# Patient Record
Sex: Female | Born: 1940 | Race: White | Hispanic: No | Smoking: Current every day smoker
Health system: Southern US, Community
[De-identification: ages and names within clinical notes are randomized; demographics above are authoritative.]

## PROBLEM LIST (undated history)

## (undated) DIAGNOSIS — F32A Depression, unspecified: Secondary | ICD-10-CM

## (undated) DIAGNOSIS — I10 Essential (primary) hypertension: Secondary | ICD-10-CM

## (undated) DIAGNOSIS — G4730 Sleep apnea, unspecified: Secondary | ICD-10-CM

## (undated) DIAGNOSIS — Z8739 Personal history of other diseases of the musculoskeletal system and connective tissue: Secondary | ICD-10-CM

## (undated) DIAGNOSIS — Z860100 Personal history of colon polyps, unspecified: Secondary | ICD-10-CM

## (undated) DIAGNOSIS — E039 Hypothyroidism, unspecified: Secondary | ICD-10-CM

## (undated) DIAGNOSIS — G2581 Restless legs syndrome: Secondary | ICD-10-CM

## (undated) DIAGNOSIS — E538 Deficiency of other specified B group vitamins: Secondary | ICD-10-CM

## (undated) DIAGNOSIS — J449 Chronic obstructive pulmonary disease, unspecified: Secondary | ICD-10-CM

## (undated) DIAGNOSIS — K219 Gastro-esophageal reflux disease without esophagitis: Secondary | ICD-10-CM

## (undated) DIAGNOSIS — G43909 Migraine, unspecified, not intractable, without status migrainosus: Secondary | ICD-10-CM

## (undated) DIAGNOSIS — F329 Major depressive disorder, single episode, unspecified: Secondary | ICD-10-CM

## (undated) DIAGNOSIS — C73 Malignant neoplasm of thyroid gland: Secondary | ICD-10-CM

## (undated) DIAGNOSIS — R32 Unspecified urinary incontinence: Secondary | ICD-10-CM

## (undated) DIAGNOSIS — E785 Hyperlipidemia, unspecified: Secondary | ICD-10-CM

## (undated) DIAGNOSIS — G4700 Insomnia, unspecified: Secondary | ICD-10-CM

## (undated) DIAGNOSIS — J302 Other seasonal allergic rhinitis: Secondary | ICD-10-CM

## (undated) DIAGNOSIS — T7840XA Allergy, unspecified, initial encounter: Secondary | ICD-10-CM

## (undated) DIAGNOSIS — H409 Unspecified glaucoma: Secondary | ICD-10-CM

## (undated) DIAGNOSIS — R079 Chest pain, unspecified: Secondary | ICD-10-CM

## (undated) HISTORY — DX: Unspecified urinary incontinence: R32

## (undated) HISTORY — PX: HEMORRHOID SURGERY: SHX153

## (undated) HISTORY — DX: Unspecified glaucoma: H40.9

## (undated) HISTORY — DX: Hyperlipidemia, unspecified: E78.5

## (undated) HISTORY — PX: CATARACT EXTRACTION, BILATERAL: SHX1313

## (undated) HISTORY — PX: CHOLECYSTECTOMY: SHX55

## (undated) HISTORY — DX: Sleep apnea, unspecified: G47.30

## (undated) HISTORY — PX: ABDOMINAL HYSTERECTOMY: SHX81

## (undated) HISTORY — PX: TUBAL LIGATION: SHX77

## (undated) HISTORY — DX: Allergy, unspecified, initial encounter: T78.40XA

---

## 2004-07-30 ENCOUNTER — Ambulatory Visit: Payer: Self-pay | Admitting: Neurosurgery

## 2004-08-21 HISTORY — PX: CERVICAL LAMINECTOMY: SHX94

## 2004-08-22 ENCOUNTER — Inpatient Hospital Stay (HOSPITAL_COMMUNITY): Admission: RE | Admit: 2004-08-22 | Discharge: 2004-08-23 | Payer: Self-pay | Admitting: Neurosurgery

## 2004-11-13 ENCOUNTER — Ambulatory Visit: Payer: Self-pay | Admitting: Internal Medicine

## 2005-11-10 ENCOUNTER — Ambulatory Visit: Payer: Self-pay | Admitting: Ophthalmology

## 2005-12-17 ENCOUNTER — Ambulatory Visit: Payer: Self-pay | Admitting: Internal Medicine

## 2006-01-06 ENCOUNTER — Emergency Department: Payer: Self-pay | Admitting: Emergency Medicine

## 2006-12-20 ENCOUNTER — Ambulatory Visit: Payer: Self-pay | Admitting: Internal Medicine

## 2007-12-22 ENCOUNTER — Ambulatory Visit: Payer: Self-pay | Admitting: Internal Medicine

## 2008-11-23 ENCOUNTER — Ambulatory Visit: Payer: Self-pay | Admitting: Unknown Physician Specialty

## 2008-12-26 ENCOUNTER — Ambulatory Visit: Payer: Self-pay | Admitting: Internal Medicine

## 2008-12-27 ENCOUNTER — Ambulatory Visit: Payer: Self-pay | Admitting: Unknown Physician Specialty

## 2009-12-27 ENCOUNTER — Ambulatory Visit: Payer: Self-pay | Admitting: Internal Medicine

## 2010-01-15 ENCOUNTER — Ambulatory Visit: Payer: Self-pay | Admitting: Unknown Physician Specialty

## 2010-12-29 ENCOUNTER — Ambulatory Visit: Payer: Self-pay | Admitting: Internal Medicine

## 2012-01-19 ENCOUNTER — Ambulatory Visit: Payer: Self-pay | Admitting: Unknown Physician Specialty

## 2012-01-19 LAB — CREATININE, SERUM
Creatinine: 0.81 mg/dL (ref 0.60–1.30)
EGFR (African American): >60
EGFR (Non-African Amer.): >60

## 2012-04-26 ENCOUNTER — Ambulatory Visit: Payer: Self-pay | Admitting: Internal Medicine

## 2012-12-13 DIAGNOSIS — R339 Retention of urine, unspecified: Secondary | ICD-10-CM | POA: Insufficient documentation

## 2012-12-13 DIAGNOSIS — N393 Stress incontinence (female) (male): Secondary | ICD-10-CM | POA: Insufficient documentation

## 2012-12-13 DIAGNOSIS — N319 Neuromuscular dysfunction of bladder, unspecified: Secondary | ICD-10-CM | POA: Insufficient documentation

## 2012-12-13 DIAGNOSIS — R350 Frequency of micturition: Secondary | ICD-10-CM | POA: Insufficient documentation

## 2013-06-05 ENCOUNTER — Ambulatory Visit: Payer: Self-pay | Admitting: Family Medicine

## 2013-08-02 ENCOUNTER — Ambulatory Visit: Payer: Self-pay | Admitting: Otolaryngology

## 2013-12-15 ENCOUNTER — Ambulatory Visit: Payer: Self-pay | Admitting: Ophthalmology

## 2013-12-15 DIAGNOSIS — I10 Essential (primary) hypertension: Secondary | ICD-10-CM

## 2013-12-15 LAB — POTASSIUM: Potassium: 3.8 mmol/L (ref 3.5–5.1)

## 2013-12-27 ENCOUNTER — Ambulatory Visit: Payer: Self-pay | Admitting: Ophthalmology

## 2014-06-01 DIAGNOSIS — K219 Gastro-esophageal reflux disease without esophagitis: Secondary | ICD-10-CM | POA: Insufficient documentation

## 2014-06-01 DIAGNOSIS — I10 Essential (primary) hypertension: Secondary | ICD-10-CM | POA: Insufficient documentation

## 2014-06-01 DIAGNOSIS — F419 Anxiety disorder, unspecified: Secondary | ICD-10-CM | POA: Insufficient documentation

## 2014-06-01 DIAGNOSIS — Z85850 Personal history of malignant neoplasm of thyroid: Secondary | ICD-10-CM | POA: Insufficient documentation

## 2014-06-01 DIAGNOSIS — E039 Hypothyroidism, unspecified: Secondary | ICD-10-CM | POA: Insufficient documentation

## 2015-01-12 NOTE — Op Note (Signed)
PATIENT NAME:  Brooke King, Brooke King MR#:  [id number] DATE OF BIRTH:  1941-05-05  DATE OF PROCEDURE:  12/27/2013  PREOPERATIVE DIAGNOSIS:  Senile cataract left eye.  POSTOPERATIVE DIAGNOSIS:  Senile cataract left eye.  PROCEDURE:  Phacoemulsification with posterior chamber intraocular lens implantation of the left eye.  LENS:  SN60WF 22.0-diopter posterior chamber intraocular lens.  ULTRASOUND TIME:  24% of 1 minute 22 seconds.  CDE 19.5.  SURGEON:  Mali Lakyla Biswas, MD  ANESTHESIA:  Topical with tetracaine drops and 2% Xylocaine jelly.  COMPLICATIONS:  None.  DESCRIPTION OF PROCEDURE:  The patient was identified in the holding room and transported to the operating room and placed in the supine position under the operating microscope.  The left eye was identified as the operative eye, and it was prepped and draped in the usual sterile ophthalmic fashion.  A 1 millimeter clear-corneal paracentesis was made at the 1:30 position.  The anterior chamber was filled with Viscoat  viscoelastic.  A 2.4 millimeter keratome was used to make a near-clear corneal incision at the 10:30 position.  A curvilinear capsulorrhexis was made with a cystotome and capsulorrhexis forceps.  Balanced salt solution was used to hydrodissect and hydrodelineate the nucleus.  Phacoemulsification was then used in stop and chop fashion to remove the lens nucleus and epinucleus.  The remaining cortex was then removed using the irrigation and aspiration handpiece. Provisc was then placed into the capsular bag to distend it for lens placement.  A SN60WF 22.0-diopter lens was then injected into the capsular bag.  The remaining viscoelastic was aspirated.  Wounds were hydrated with balanced salt solution.  The anterior chamber was inflated to a physiologic pressure with balanced salt solution, and 0.1 mL of Vigamox 2 mg/mL were injected into the anterior chamber for a total dose of 0.2 mg of intracameral antibiotic at the completion of the  case. Miostat was placed into the anterior chamber to constrict the pupil.  No wound leaks were noted.  Topical Vigamox drops and Maxitrol ointment were applied to the eye.    The patient was taken to the recovery room in stable condition without complications of anesthesia or surgery.    ____________________________ Wyonia Hough, MD crb:ce D: 12/27/2013 15:05:00 ET T: 12/27/2013 17:52:08 ET JOB#: [id number]  cc: Wyonia Hough, MD, <Dictator> Leandrew Koyanagi MD ELECTRONICALLY SIGNED 01/03/2014 14:51

## 2015-04-05 ENCOUNTER — Other Ambulatory Visit: Payer: Self-pay | Admitting: Family Medicine

## 2015-04-05 DIAGNOSIS — Z1231 Encounter for screening mammogram for malignant neoplasm of breast: Secondary | ICD-10-CM

## 2015-04-09 ENCOUNTER — Other Ambulatory Visit: Payer: Self-pay | Admitting: Family Medicine

## 2015-04-09 ENCOUNTER — Ambulatory Visit
Admission: RE | Admit: 2015-04-09 | Discharge: 2015-04-09 | Disposition: A | Discharge status: Home / Self Care | Payer: Medicare Other | Source: Ambulatory Visit | Attending: Family Medicine | Admitting: Family Medicine

## 2015-04-09 DIAGNOSIS — Z1231 Encounter for screening mammogram for malignant neoplasm of breast: Secondary | ICD-10-CM | POA: Diagnosis present

## 2015-04-09 HISTORY — DX: Malignant neoplasm of thyroid gland: C73

## 2015-04-22 ENCOUNTER — Other Ambulatory Visit: Payer: Self-pay | Admitting: Otolaryngology

## 2015-04-22 DIAGNOSIS — R059 Cough, unspecified: Secondary | ICD-10-CM

## 2015-04-22 DIAGNOSIS — R05 Cough: Secondary | ICD-10-CM

## 2015-04-22 DIAGNOSIS — F17200 Nicotine dependence, unspecified, uncomplicated: Secondary | ICD-10-CM

## 2015-04-29 ENCOUNTER — Ambulatory Visit: Payer: Medicare Other

## 2015-04-29 ENCOUNTER — Ambulatory Visit: Payer: Medicare Other | Attending: Otolaryngology

## 2015-04-29 DIAGNOSIS — F5101 Primary insomnia: Secondary | ICD-10-CM | POA: Insufficient documentation

## 2015-04-29 DIAGNOSIS — G4733 Obstructive sleep apnea (adult) (pediatric): Secondary | ICD-10-CM | POA: Insufficient documentation

## 2015-05-02 ENCOUNTER — Ambulatory Visit: Payer: Medicare Other

## 2015-06-07 DIAGNOSIS — G4733 Obstructive sleep apnea (adult) (pediatric): Secondary | ICD-10-CM | POA: Insufficient documentation

## 2015-09-19 ENCOUNTER — Other Ambulatory Visit: Payer: Self-pay | Admitting: Otolaryngology

## 2015-09-19 DIAGNOSIS — R911 Solitary pulmonary nodule: Secondary | ICD-10-CM

## 2015-09-19 DIAGNOSIS — R9389 Abnormal findings on diagnostic imaging of other specified body structures: Secondary | ICD-10-CM

## 2015-09-25 ENCOUNTER — Ambulatory Visit
Admission: RE | Admit: 2015-09-25 | Discharge: 2015-09-25 | Disposition: A | Discharge status: Home / Self Care | Payer: Medicare Other | Source: Ambulatory Visit | Attending: Otolaryngology | Admitting: Otolaryngology

## 2015-09-25 DIAGNOSIS — K7689 Other specified diseases of liver: Secondary | ICD-10-CM | POA: Insufficient documentation

## 2015-09-25 DIAGNOSIS — R05 Cough: Secondary | ICD-10-CM | POA: Diagnosis present

## 2015-09-25 DIAGNOSIS — D3502 Benign neoplasm of left adrenal gland: Secondary | ICD-10-CM | POA: Insufficient documentation

## 2015-09-25 DIAGNOSIS — R911 Solitary pulmonary nodule: Secondary | ICD-10-CM | POA: Diagnosis not present

## 2015-09-25 DIAGNOSIS — R9389 Abnormal findings on diagnostic imaging of other specified body structures: Secondary | ICD-10-CM

## 2015-09-25 DIAGNOSIS — R918 Other nonspecific abnormal finding of lung field: Secondary | ICD-10-CM | POA: Insufficient documentation

## 2015-09-25 DIAGNOSIS — Z720 Tobacco use: Secondary | ICD-10-CM | POA: Diagnosis present

## 2015-09-25 HISTORY — DX: Essential (primary) hypertension: I10

## 2015-09-25 LAB — POCT I-STAT CREATININE: Creatinine, Ser: 0.9 mg/dL (ref 0.44–1.00)

## 2015-09-25 MED ORDER — IOHEXOL 300 MG/ML  SOLN
75.0000 mL | Freq: Once | INTRAMUSCULAR | Status: AC | PRN
  Administered 2015-09-25: 75 mL via INTRAVENOUS

## 2015-10-18 ENCOUNTER — Other Ambulatory Visit: Payer: Self-pay | Admitting: Nurse Practitioner

## 2015-10-18 DIAGNOSIS — K760 Fatty (change of) liver, not elsewhere classified: Secondary | ICD-10-CM

## 2015-10-31 ENCOUNTER — Ambulatory Visit
Admission: RE | Admit: 2015-10-31 | Discharge: 2015-10-31 | Disposition: A | Discharge status: Home / Self Care | Payer: Medicare Other | Source: Ambulatory Visit | Attending: Nurse Practitioner | Admitting: Nurse Practitioner

## 2015-10-31 DIAGNOSIS — K760 Fatty (change of) liver, not elsewhere classified: Secondary | ICD-10-CM

## 2015-11-05 ENCOUNTER — Other Ambulatory Visit: Payer: Self-pay | Admitting: Nurse Practitioner

## 2015-11-05 DIAGNOSIS — K760 Fatty (change of) liver, not elsewhere classified: Secondary | ICD-10-CM

## 2015-11-06 NOTE — Progress Notes (Signed)
Fatty Liver. Will discuss with patient at follow up visit.

## 2015-11-14 ENCOUNTER — Ambulatory Visit
Admission: RE | Admit: 2015-11-14 | Discharge: 2015-11-14 | Disposition: A | Discharge status: Home / Self Care | Payer: Medicare Other | Source: Ambulatory Visit | Attending: Nurse Practitioner | Admitting: Nurse Practitioner

## 2015-11-14 DIAGNOSIS — K760 Fatty (change of) liver, not elsewhere classified: Secondary | ICD-10-CM | POA: Insufficient documentation

## 2015-11-15 ENCOUNTER — Encounter: Payer: Self-pay | Admitting: *Deleted

## 2015-11-18 ENCOUNTER — Ambulatory Visit: Payer: Medicare Other | Admitting: Anesthesiology

## 2015-11-18 ENCOUNTER — Ambulatory Visit
Admission: RE | Admit: 2015-11-18 | Discharge: 2015-11-18 | Disposition: A | Discharge status: Home / Self Care | Payer: Medicare Other | Source: Ambulatory Visit | Attending: Gastroenterology | Admitting: Gastroenterology

## 2015-11-18 ENCOUNTER — Encounter
Admission: RE | Disposition: A | Discharge status: Home / Self Care | Payer: Self-pay | Source: Ambulatory Visit | Attending: Gastroenterology

## 2015-11-18 DIAGNOSIS — Z79899 Other long term (current) drug therapy: Secondary | ICD-10-CM | POA: Insufficient documentation

## 2015-11-18 DIAGNOSIS — F329 Major depressive disorder, single episode, unspecified: Secondary | ICD-10-CM | POA: Diagnosis not present

## 2015-11-18 DIAGNOSIS — Z90710 Acquired absence of both cervix and uterus: Secondary | ICD-10-CM | POA: Insufficient documentation

## 2015-11-18 DIAGNOSIS — F17200 Nicotine dependence, unspecified, uncomplicated: Secondary | ICD-10-CM | POA: Diagnosis not present

## 2015-11-18 DIAGNOSIS — Z85850 Personal history of malignant neoplasm of thyroid: Secondary | ICD-10-CM | POA: Insufficient documentation

## 2015-11-18 DIAGNOSIS — Z885 Allergy status to narcotic agent status: Secondary | ICD-10-CM | POA: Diagnosis not present

## 2015-11-18 DIAGNOSIS — G43909 Migraine, unspecified, not intractable, without status migrainosus: Secondary | ICD-10-CM | POA: Insufficient documentation

## 2015-11-18 DIAGNOSIS — I10 Essential (primary) hypertension: Secondary | ICD-10-CM | POA: Insufficient documentation

## 2015-11-18 DIAGNOSIS — Z7951 Long term (current) use of inhaled steroids: Secondary | ICD-10-CM | POA: Insufficient documentation

## 2015-11-18 DIAGNOSIS — G2581 Restless legs syndrome: Secondary | ICD-10-CM | POA: Insufficient documentation

## 2015-11-18 DIAGNOSIS — K210 Gastro-esophageal reflux disease with esophagitis: Secondary | ICD-10-CM | POA: Insufficient documentation

## 2015-11-18 DIAGNOSIS — E039 Hypothyroidism, unspecified: Secondary | ICD-10-CM | POA: Insufficient documentation

## 2015-11-18 DIAGNOSIS — R195 Other fecal abnormalities: Secondary | ICD-10-CM | POA: Diagnosis present

## 2015-11-18 DIAGNOSIS — Z7982 Long term (current) use of aspirin: Secondary | ICD-10-CM | POA: Diagnosis not present

## 2015-11-18 DIAGNOSIS — Z8249 Family history of ischemic heart disease and other diseases of the circulatory system: Secondary | ICD-10-CM | POA: Diagnosis not present

## 2015-11-18 DIAGNOSIS — Z888 Allergy status to other drugs, medicaments and biological substances status: Secondary | ICD-10-CM | POA: Diagnosis not present

## 2015-11-18 DIAGNOSIS — Z880 Allergy status to penicillin: Secondary | ICD-10-CM | POA: Insufficient documentation

## 2015-11-18 DIAGNOSIS — K5730 Diverticulosis of large intestine without perforation or abscess without bleeding: Secondary | ICD-10-CM | POA: Insufficient documentation

## 2015-11-18 DIAGNOSIS — Z9049 Acquired absence of other specified parts of digestive tract: Secondary | ICD-10-CM | POA: Diagnosis not present

## 2015-11-18 DIAGNOSIS — G4700 Insomnia, unspecified: Secondary | ICD-10-CM | POA: Diagnosis not present

## 2015-11-18 DIAGNOSIS — Z882 Allergy status to sulfonamides status: Secondary | ICD-10-CM | POA: Insufficient documentation

## 2015-11-18 DIAGNOSIS — Z86010 Personal history of colonic polyps: Secondary | ICD-10-CM | POA: Diagnosis not present

## 2015-11-18 DIAGNOSIS — J449 Chronic obstructive pulmonary disease, unspecified: Secondary | ICD-10-CM | POA: Insufficient documentation

## 2015-11-18 DIAGNOSIS — Z823 Family history of stroke: Secondary | ICD-10-CM | POA: Diagnosis not present

## 2015-11-18 DIAGNOSIS — M5030 Other cervical disc degeneration, unspecified cervical region: Secondary | ICD-10-CM | POA: Insufficient documentation

## 2015-11-18 DIAGNOSIS — E538 Deficiency of other specified B group vitamins: Secondary | ICD-10-CM | POA: Insufficient documentation

## 2015-11-18 HISTORY — PX: ESOPHAGOGASTRODUODENOSCOPY (EGD) WITH PROPOFOL: SHX5813

## 2015-11-18 HISTORY — DX: Personal history of colonic polyps: Z86.010

## 2015-11-18 HISTORY — DX: Insomnia, unspecified: G47.00

## 2015-11-18 HISTORY — DX: Personal history of colon polyps, unspecified: Z86.0100

## 2015-11-18 HISTORY — DX: Major depressive disorder, single episode, unspecified: F32.9

## 2015-11-18 HISTORY — DX: Chronic obstructive pulmonary disease, unspecified: J44.9

## 2015-11-18 HISTORY — DX: Chest pain, unspecified: R07.9

## 2015-11-18 HISTORY — DX: Deficiency of other specified B group vitamins: E53.8

## 2015-11-18 HISTORY — DX: Personal history of other diseases of the musculoskeletal system and connective tissue: Z87.39

## 2015-11-18 HISTORY — DX: Restless legs syndrome: G25.81

## 2015-11-18 HISTORY — DX: Hypothyroidism, unspecified: E03.9

## 2015-11-18 HISTORY — DX: Depression, unspecified: F32.A

## 2015-11-18 HISTORY — DX: Other seasonal allergic rhinitis: J30.2

## 2015-11-18 HISTORY — PX: COLONOSCOPY WITH PROPOFOL: SHX5780

## 2015-11-18 HISTORY — DX: Gastro-esophageal reflux disease without esophagitis: K21.9

## 2015-11-18 HISTORY — DX: Migraine, unspecified, not intractable, without status migrainosus: G43.909

## 2015-11-18 LAB — GLUCOSE, CAPILLARY: GLUCOSE-CAPILLARY: 117 mg/dL — AB (ref 65–99)

## 2015-11-18 SURGERY — COLONOSCOPY WITH PROPOFOL
Anesthesia: General

## 2015-11-18 MED ORDER — PROPOFOL 500 MG/50ML IV EMUL
INTRAVENOUS | Status: DC | PRN
  Administered 2015-11-18: 75 ug/kg/min via INTRAVENOUS

## 2015-11-18 MED ORDER — SODIUM CHLORIDE 0.9 % IV SOLN: INTRAVENOUS | Status: DC

## 2015-11-18 MED ORDER — LACTATED RINGERS IV SOLN
INTRAVENOUS | Status: DC | PRN
  Administered 2015-11-18: 11:00:00 via INTRAVENOUS

## 2015-11-18 MED ORDER — MIDAZOLAM HCL 2 MG/2ML IJ SOLN
INTRAMUSCULAR | Status: DC | PRN
  Administered 2015-11-18: 1 mg via INTRAVENOUS

## 2015-11-18 MED ORDER — FENTANYL CITRATE (PF) 100 MCG/2ML IJ SOLN
INTRAMUSCULAR | Status: DC | PRN
  Administered 2015-11-18: 50 ug via INTRAVENOUS

## 2015-11-18 MED ORDER — SODIUM CHLORIDE 0.9 % IV SOLN
INTRAVENOUS | Status: DC
  Administered 2015-11-18: 1000 mL via INTRAVENOUS

## 2015-11-18 NOTE — Op Note (Signed)
[facility] Patient Name: Brooke King Procedure Date: 11/18/2015 10:46 AM MRN: [id number] Account #: [id number] Date of Birth: 10-15-1940 Admit Type: Outpatient Age: 75 Room: [facility] ENDO ROOM 4 Gender: Female Note Status: Finalized Procedure:            Upper GI endoscopy Indications:          Heme positive stool Providers:            Lupita Dawn. Candace Cruise, MD Referring MD:         Caprice Renshaw (Referring MD) Medicines:            Monitored Anesthesia Care Complications:        No immediate complications. Procedure:            Pre-Anesthesia Assessment:                       - Prior to the procedure, a History and Physical was                        performed, and patient medications, allergies and                        sensitivities were reviewed. The patient's tolerance of                        previous anesthesia was reviewed.                       - The risks and benefits of the procedure and the                        sedation options and risks were discussed with the                        patient. All questions were answered and informed                        consent was obtained.                       - After reviewing the risks and benefits, the patient                        was deemed in satisfactory condition to undergo the                        procedure.                       After obtaining informed consent, the endoscope was                        passed under direct vision. Throughout the procedure,                        the patient's blood pressure, pulse, and oxygen                        saturations were monitored continuously. The Endoscope  was introduced through the mouth, and advanced to the                        second part of duodenum. The upper GI endoscopy was                        accomplished without difficulty. The patient tolerated                        the procedure well. Findings:      LA  Grade A (one or more mucosal breaks less than 5 mm, not extending       between tops of 2 mucosal folds) esophagitis was found at the       gastroesophageal junction. Biopsies were taken with a cold forceps for       histology.      The exam was otherwise without abnormality.      The entire examined stomach was normal.      The examined duodenum was normal. Impression:           - LA Grade A reflux esophagitis. Biopsied.                       - The examination was otherwise normal.                       - Normal stomach.                       - Normal examined duodenum. Recommendation:       - Discharge patient to home.                       - Observe patient's clinical course.                       - Await pathology results.                       - The findings and recommendations were discussed with                        the patient. Procedure Code(s):    --- Professional ---                       [phone], Esophagogastroduodenoscopy, flexible, transoral;                        with biopsy, single or multiple Diagnosis Code(s):    --- Professional ---                       K21.0, Gastro-esophageal reflux disease with esophagitis                       R19.5, Other fecal abnormalities CPT copyright 2016 American Medical Association. All rights reserved. The codes documented in this report are preliminary and upon coder review may  be revised to meet current compliance requirements. Hulen Luster, MD 11/18/2015 10:55:49 AM This report has been signed electronically. Number of Addenda: 0 Note Initiated On: 11/18/2015 10:46 AM      [facility]

## 2015-11-18 NOTE — Anesthesia Preprocedure Evaluation (Signed)
Anesthesia Evaluation  Patient identified by MRN, date of birth, ID band Patient awake    Reviewed: Allergy & Precautions, H&P , NPO status , Patient's Chart, lab work & pertinent test results, reviewed documented beta blocker date and time   Airway Mallampati: III   Neck ROM: full    Dental  (+) Poor Dentition   Pulmonary neg pulmonary ROS, COPD, Current Smoker,    Pulmonary exam normal        Cardiovascular hypertension, negative cardio ROS Normal cardiovascular exam     Neuro/Psych  Headaches, PSYCHIATRIC DISORDERS negative neurological ROS  negative psych ROS   GI/Hepatic negative GI ROS, Neg liver ROS, GERD  ,  Endo/Other  negative endocrine ROSHypothyroidism   Renal/GU negative Renal ROS  negative genitourinary   Musculoskeletal   Abdominal   Peds  Hematology negative hematology ROS (+)   Anesthesia Other Findings Past Medical History:   Thyroid cancer ([place])                                         Hypertension                                                 B12 deficiency                                               Chest pain syndrome                                          COPD (chronic obstructive pulmonary disease) (*              Depression                                                   GERD (gastroesophageal reflux disease)                       History of colonic polyps                                    History of degenerative disc disease                           Comment:c-spine, status post cervical laminectomy   Hypothyroidism                                               Migraine  Insomnia                                                     Restless leg syndrome                                        Seasonal allergies                                         Past Surgical History:   CERVICAL LAMINECTOMY                              08/2004      CHOLECYSTECTOMY                                               TUBAL LIGATION                                                ABDOMINAL HYSTERECTOMY                                        HEMORRHOID SURGERY                                          BMI    Body Mass Index   34.37 kg/m 2     Reproductive/Obstetrics                             Anesthesia Physical Anesthesia Plan  ASA: III  Anesthesia Plan: General   Post-op Pain Management:    Induction:   Airway Management Planned:   Additional Equipment:   Intra-op Plan:   Post-operative Plan:   Informed Consent: I have reviewed the patients History and Physical, chart, labs and discussed the procedure including the risks, benefits and alternatives for the proposed anesthesia with the patient or authorized representative who has indicated his/her understanding and acceptance.   Dental Advisory Given  Plan Discussed with: CRNA  Anesthesia Plan Comments:         Anesthesia Quick Evaluation

## 2015-11-18 NOTE — Anesthesia Postprocedure Evaluation (Signed)
Anesthesia Post Note  Patient: AVACYN BJORGE  Procedure(s) Performed: Procedure(s) (LRB): COLONOSCOPY WITH PROPOFOL (N/A) ESOPHAGOGASTRODUODENOSCOPY (EGD) WITH PROPOFOL (N/A)  Patient location during evaluation: PACU Anesthesia Type: General Level of consciousness: awake and alert Pain management: pain level controlled Vital Signs Assessment: post-procedure vital signs reviewed and stable Respiratory status: spontaneous breathing, nonlabored ventilation, respiratory function stable and patient connected to nasal cannula oxygen Cardiovascular status: blood pressure returned to baseline and stable Postop Assessment: no signs of nausea or vomiting Anesthetic complications: no    Last Vitals:  Filed Vitals:   11/18/15 1131 11/18/15 1141  BP: 111/70 117/90  Pulse: 77 70  Temp:    Resp: 19 27    Last Pain: There were no vitals filed for this visit.               Molli Barrows

## 2015-11-18 NOTE — Op Note (Signed)
[facility] Patient Name: Brooke King Procedure Date: 11/18/2015 10:45 AM MRN: [id number] Account #: [id number] Date of Birth: 1941/07/20 Admit Type: Outpatient Age: 75 Room: [facility] ENDO ROOM 4 Gender: Female Note Status: Finalized Procedure:            Colonoscopy Indications:          Heme positive stool, Personal history of colonic polyps Providers:            Lupita Dawn. Candace Cruise, MD Referring MD:         Caprice Renshaw (Referring MD) Medicines:            Monitored Anesthesia Care Complications:        No immediate complications. Procedure:            Pre-Anesthesia Assessment:                       - Prior to the procedure, a History and Physical was                        performed, and patient medications, allergies and                        sensitivities were reviewed. The patient's tolerance of                        previous anesthesia was reviewed.                       - The risks and benefits of the procedure and the                        sedation options and risks were discussed with the                        patient. All questions were answered and informed                        consent was obtained.                       - After reviewing the risks and benefits, the patient                        was deemed in satisfactory condition to undergo the                        procedure.                       After obtaining informed consent, the colonoscope was                        passed under direct vision. Throughout the procedure,                        the patient's blood pressure, pulse, and oxygen                        saturations were monitored continuously. The Olympus  CF-Q160AL colonoscope (S#. [id number]) was introduced                        through the anus and advanced to the the cecum,                        identified by appendiceal orifice and ileocecal valve.                        The colonoscopy was  performed without difficulty. The                        patient tolerated the procedure well. The quality of                        the bowel preparation was fair. Findings:      Many small and large-mouthed diverticula were found in the sigmoid colon.      The exam was otherwise without abnormality. Impression:           - Preparation of the colon was fair.                       - Diverticulosis in the sigmoid colon.                       - The examination was otherwise normal.                       - No specimens collected. Recommendation:       - Discharge patient to home.                       - Repeat colonoscopy in 5 years for surveillance.                       - The findings and recommendations were discussed with                        the patient. Procedure Code(s):    --- Professional ---                       [phone], Colonoscopy, flexible; diagnostic, including                        collection of specimen(s) by brushing or washing, when                        performed (separate procedure) Diagnosis Code(s):    --- Professional ---                       R19.5, Other fecal abnormalities                       Z86.010, Personal history of colonic polyps                       K57.30, Diverticulosis of large intestine without                        perforation or abscess without bleeding CPT copyright 2016 American Medical Association. All  rights reserved. The codes documented in this report are preliminary and upon coder review may  be revised to meet current compliance requirements. Hulen Luster, MD 11/18/2015 11:10:01 AM This report has been signed electronically. Number of Addenda: 0 Note Initiated On: 11/18/2015 10:45 AM Scope Withdrawal Time: 0 hours 6 minutes 7 seconds  Total Procedure Duration: 0 hours 10 minutes 18 seconds       [facility]

## 2015-11-18 NOTE — Transfer of Care (Signed)
Immediate Anesthesia Transfer of Care Note  Patient: Brooke King  Procedure(s) Performed: Procedure(s): COLONOSCOPY WITH PROPOFOL (N/A) ESOPHAGOGASTRODUODENOSCOPY (EGD) WITH PROPOFOL (N/A)  Patient Location: PACU  Anesthesia Type:General  Level of Consciousness: awake  Airway & Oxygen Therapy: Patient Spontanous Breathing and Patient connected to nasal cannula oxygen  Post-op Assessment: Report given to RN  Post vital signs: Reviewed and stable  Last Vitals:  Filed Vitals:   11/18/15 1010 11/18/15 1110  BP: 148/75 155/53  Pulse: 80 84  Temp: 35.7 C 36.7 C  Resp: 16 16    Complications: No apparent anesthesia complications

## 2015-11-18 NOTE — H&P (Signed)
Primary Care Physician:  Marcello Fennel, MD Primary Gastroenterologist:  Dr. Candace Cruise  Pre-Procedure History & Physical: HPI:  Brooke King is a 74 y.o. female is here for an EGD/colonoscopy   Past Medical History  Diagnosis Date  . Thyroid cancer ([place])   . Hypertension   . B12 deficiency   . Chest pain syndrome   . COPD (chronic obstructive pulmonary disease) ([place])   . Depression   . GERD (gastroesophageal reflux disease)   . History of colonic polyps   . History of degenerative disc disease     c-spine, status post cervical laminectomy  . Hypothyroidism   . Migraine   . Insomnia   . Restless leg syndrome   . Seasonal allergies     Past Surgical History  Procedure Laterality Date  . Cervical laminectomy  08/2004  . Cholecystectomy    . Tubal ligation    . Abdominal hysterectomy    . Hemorrhoid surgery      Prior to Admission medications   Medication Sig Start Date End Date Taking? Authorizing Provider  albuterol (PROVENTIL HFA;VENTOLIN HFA) 108 (90 Base) MCG/ACT inhaler Inhale into the lungs every 6 (six) hours as needed for wheezing or shortness of breath.   Yes Historical Provider, MD  ALPRAZolam Duanne Moron) 0.5 MG tablet Take 0.5 mg by mouth at bedtime as needed for anxiety.   Yes Historical Provider, MD  aspirin EC 81 MG tablet Take 81 mg by mouth daily.   Yes Historical Provider, MD  azelastine (ASTELIN) 0.1 % nasal spray Place into both nostrils 2 (two) times daily. Use in each nostril as directed   Yes Historical Provider, MD  buPROPion (WELLBUTRIN XL) 150 MG 24 hr tablet Take 150 mg by mouth daily.   Yes Historical Provider, MD  cetirizine (ZYRTEC) 10 MG tablet Take 10 mg by mouth daily.   Yes Historical Provider, MD  fluticasone (FLONASE) 50 MCG/ACT nasal spray Place into both nostrils daily.   Yes Historical Provider, MD  gabapentin (NEURONTIN) 100 MG capsule Take 100 mg by mouth 3 (three) times daily.   Yes Historical Provider, MD  latanoprost (XALATAN) 0.005 %  ophthalmic solution 1 drop at bedtime.   Yes Historical Provider, MD  levothyroxine (SYNTHROID, LEVOTHROID) 150 MCG tablet Take 150 mcg by mouth daily before breakfast.   Yes Historical Provider, MD  montelukast (SINGULAIR) 10 MG tablet Take 10 mg by mouth at bedtime.   Yes Historical Provider, MD  omeprazole (PRILOSEC OTC) 20 MG tablet Take 20 mg by mouth daily.   Yes Historical Provider, MD  valsartan (DIOVAN) 80 MG tablet Take 80 mg by mouth daily.   Yes Historical Provider, MD  vitamin B-12 (CYANOCOBALAMIN) 1000 MCG tablet Take 1,000 mcg by mouth daily.   Yes Historical Provider, MD    Allergies as of 11/12/2015 - Review Complete 09/25/2015  Allergen Reaction Noted  . Penicillins Rash 09/25/2015    Family History  Problem Relation Age of Onset  . Breast cancer Neg Hx     Social History   Social History  . Marital Status: Married    Spouse Name: N/A  . Number of Children: N/A  . Years of Education: N/A   Occupational History  . Not on file.   Social History Main Topics  . Smoking status: Current Every Day Smoker  . Smokeless tobacco: Not on file  . Alcohol Use: No  . Drug Use: No  . Sexual Activity: Not on file   Other Topics  Concern  . Not on file   Social History Narrative    Review of Systems: See HPI, otherwise negative ROS  Physical Exam: There were no vitals taken for this visit. General:   Alert,  pleasant and cooperative in NAD Head:  Normocephalic and atraumatic. Neck:  Supple; no masses or thyromegaly. Lungs:  Clear throughout to auscultation.    Heart:  Regular rate and rhythm. Abdomen:  Soft, nontender and nondistended. Normal bowel sounds, without guarding, and without rebound.   Neurologic:  Alert and  oriented x4;  grossly normal neurologically.  Impression/Plan: Brooke King is here for an EGD/colonoscopy to be performed for heme positive stool, personal hx of colon polyps.  Risks, benefits, limitations, and alternatives regarding   EGD/colonoscopy have been reviewed with the patient.  Questions have been answered.  All parties agreeable.   Panayiotis [place], Lupita Dawn, MD  11/18/2015, 10:03 AM

## 2015-11-20 ENCOUNTER — Encounter: Payer: Self-pay | Admitting: Gastroenterology

## 2015-11-20 LAB — SURGICAL PATHOLOGY

## 2016-06-11 ENCOUNTER — Other Ambulatory Visit: Payer: Self-pay | Admitting: Family Medicine

## 2016-06-11 DIAGNOSIS — Z1231 Encounter for screening mammogram for malignant neoplasm of breast: Secondary | ICD-10-CM

## 2016-06-11 DIAGNOSIS — E1142 Type 2 diabetes mellitus with diabetic polyneuropathy: Secondary | ICD-10-CM | POA: Insufficient documentation

## 2016-07-06 ENCOUNTER — Ambulatory Visit
Admission: RE | Admit: 2016-07-06 | Discharge: 2016-07-06 | Disposition: A | Discharge status: Home / Self Care | Payer: Medicare Other | Source: Ambulatory Visit | Attending: Family Medicine | Admitting: Family Medicine

## 2016-07-06 DIAGNOSIS — Z1231 Encounter for screening mammogram for malignant neoplasm of breast: Secondary | ICD-10-CM | POA: Diagnosis present

## 2016-09-22 ENCOUNTER — Other Ambulatory Visit: Payer: Self-pay | Admitting: Otolaryngology

## 2016-09-22 DIAGNOSIS — R911 Solitary pulmonary nodule: Secondary | ICD-10-CM

## 2016-09-28 ENCOUNTER — Ambulatory Visit: Admission: RE | Admit: 2016-09-28 | Payer: Medicare Other | Source: Ambulatory Visit

## 2016-10-02 ENCOUNTER — Ambulatory Visit
Admission: RE | Admit: 2016-10-02 | Discharge: 2016-10-02 | Disposition: A | Discharge status: Home / Self Care | Payer: Medicare Other | Source: Ambulatory Visit | Attending: Otolaryngology | Admitting: Otolaryngology

## 2016-10-02 DIAGNOSIS — D3502 Benign neoplasm of left adrenal gland: Secondary | ICD-10-CM | POA: Insufficient documentation

## 2016-10-02 DIAGNOSIS — I700 Atherosclerosis of aorta: Secondary | ICD-10-CM | POA: Diagnosis not present

## 2016-10-02 DIAGNOSIS — R911 Solitary pulmonary nodule: Secondary | ICD-10-CM | POA: Insufficient documentation

## 2016-10-02 LAB — POCT I-STAT CREATININE: CREATININE: 0.8 mg/dL (ref 0.44–1.00)

## 2016-10-02 MED ORDER — IOPAMIDOL (ISOVUE-300) INJECTION 61%
75.0000 mL | Freq: Once | INTRAVENOUS | Status: AC | PRN
Start: 2016-10-02 — End: 2016-10-02
  Administered 2016-10-02: 75 mL via INTRAVENOUS

## 2017-06-08 ENCOUNTER — Other Ambulatory Visit: Payer: Self-pay | Admitting: Family Medicine

## 2017-06-08 DIAGNOSIS — Z1231 Encounter for screening mammogram for malignant neoplasm of breast: Secondary | ICD-10-CM

## 2017-06-27 IMAGING — US US LIVER ELASTOGRAPHY
1 series · 13 of 13 positions shown · non-contrast
Comparison: Abdominal ultrasound 10/31/2015

CLINICAL DATA: Patient with hepatic steatosis.

EXAM:
ULTRASOUND HEPATIC ELASTOGRAPHY
TECHNIQUE: Ultrasound elastography evaluation of the liver was performed. A
region of interest was placed in the right lobe of the liver.
Following application of a compressive sonographic pulse, shear
waves were detected in the adjacent hepatic tissue and the shear
wave velocity was calculated. Multiple assessments were performed at
the selected site. Median shear wave velocity is correlated to a
Metavir fibrosis score.

[Series 1: us liver elastography · 0.20mm/px · 13 of 13 slices shown]
[im 1/13]
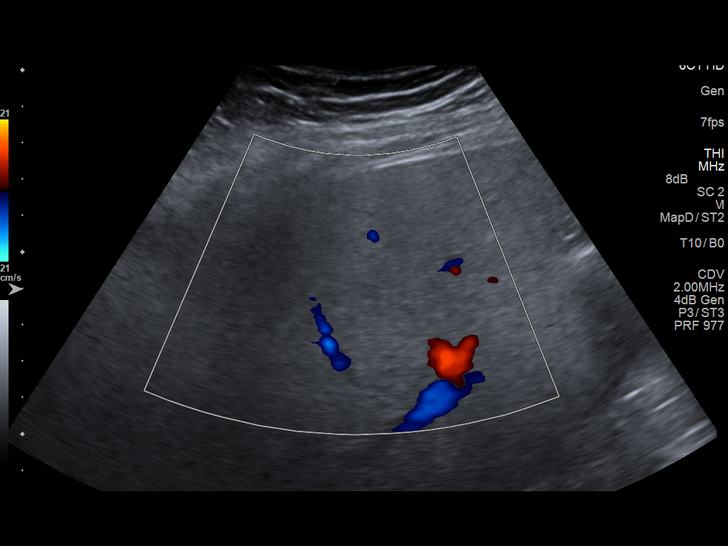
[im 2/13]
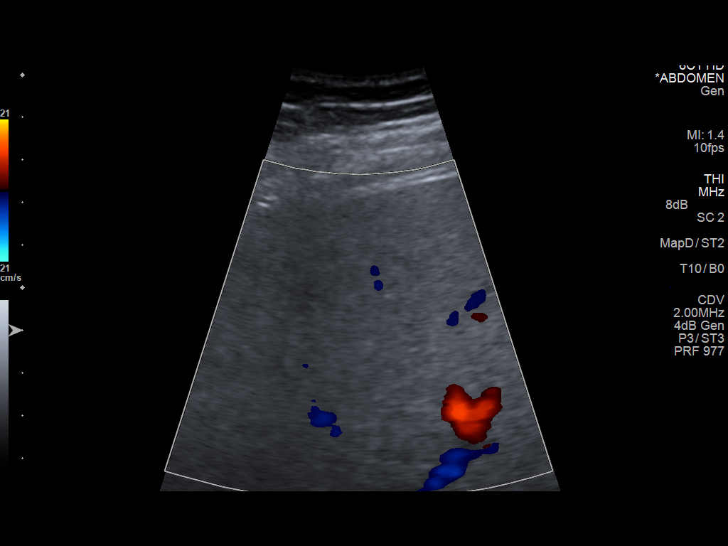
[im 3/13]
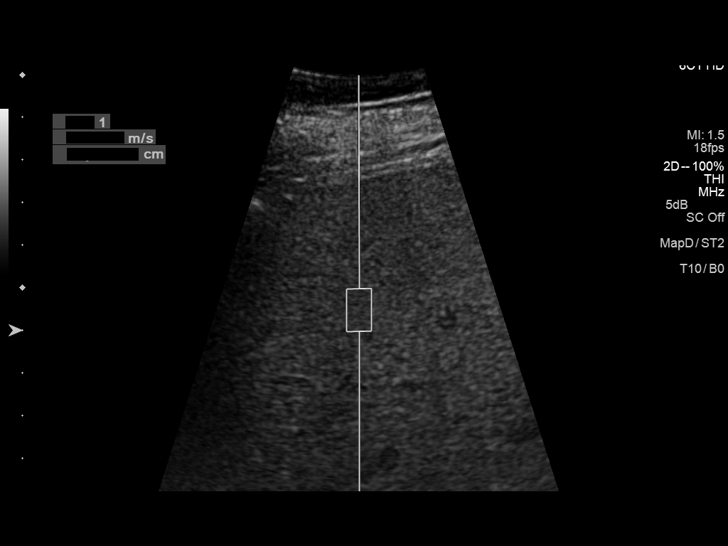
[im 4/13]
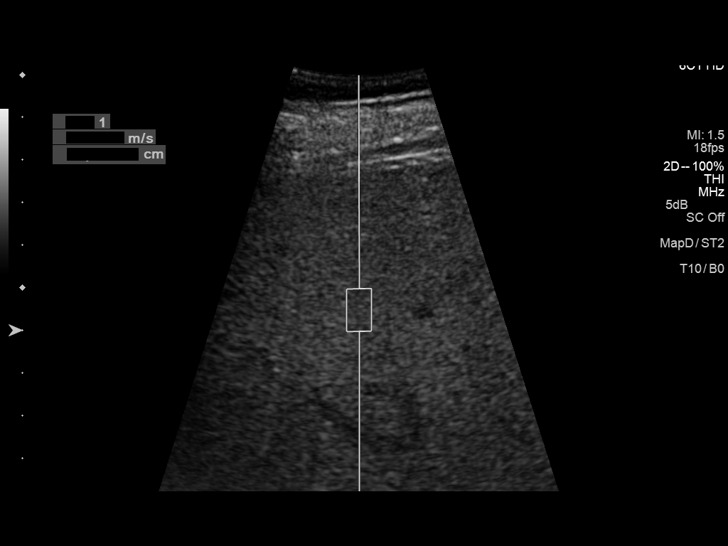
[im 5/13]
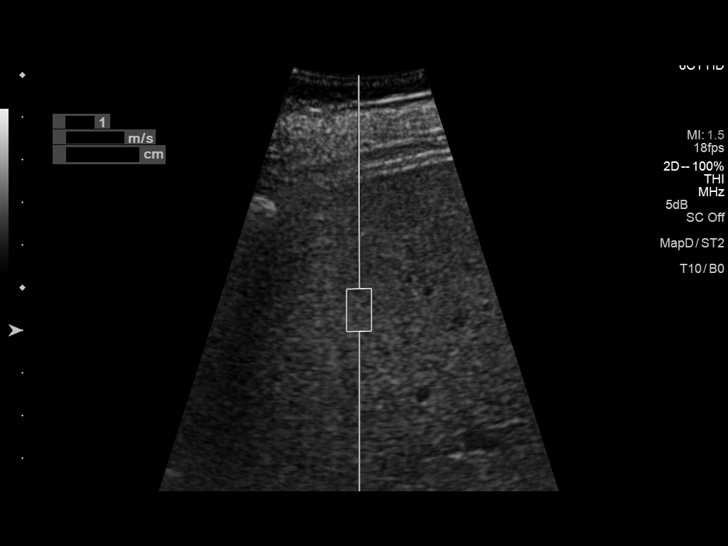
[im 6/13]
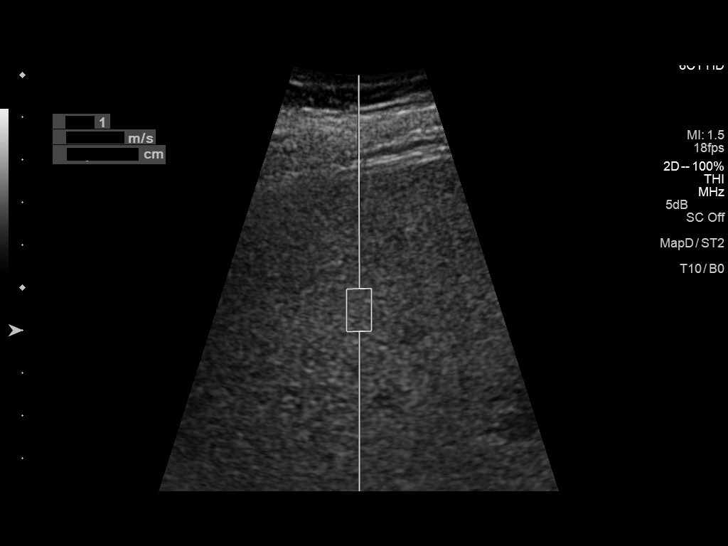
[im 7/13]
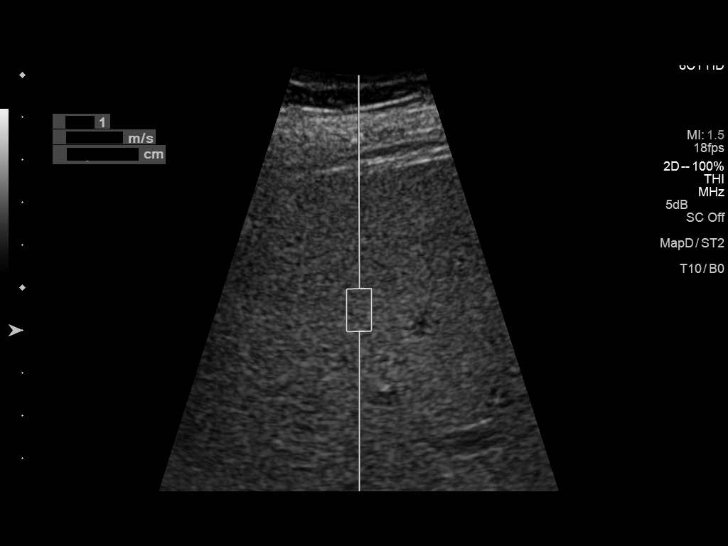
[im 8/13]
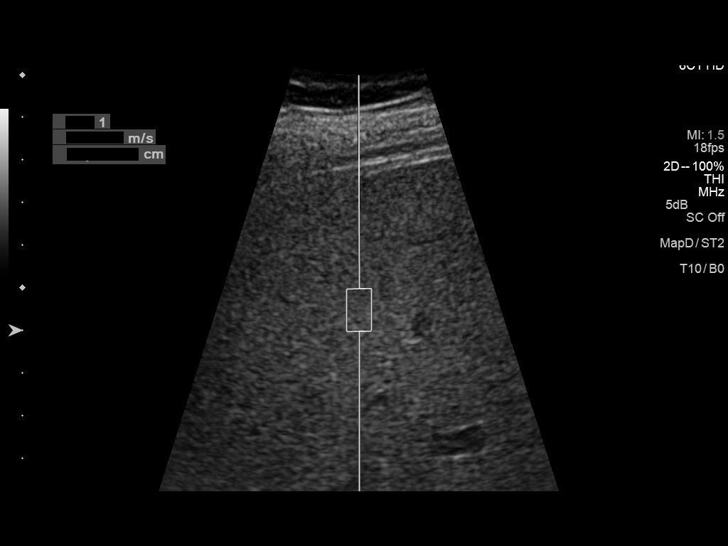
[im 9/13]
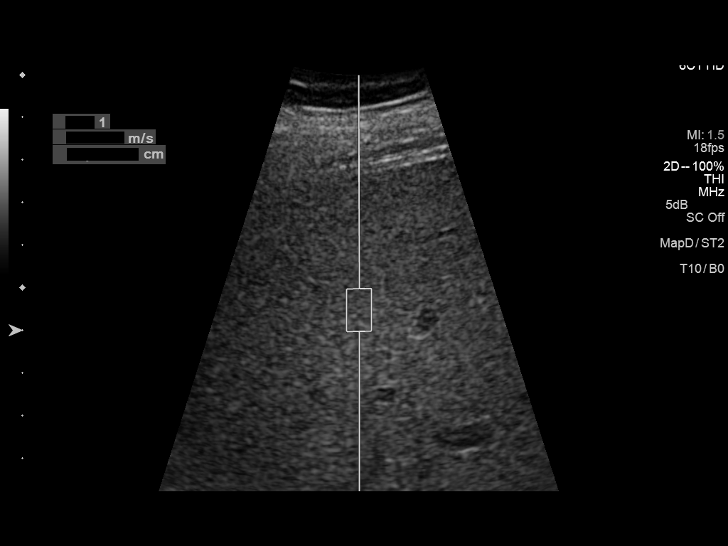
[im 10/13]
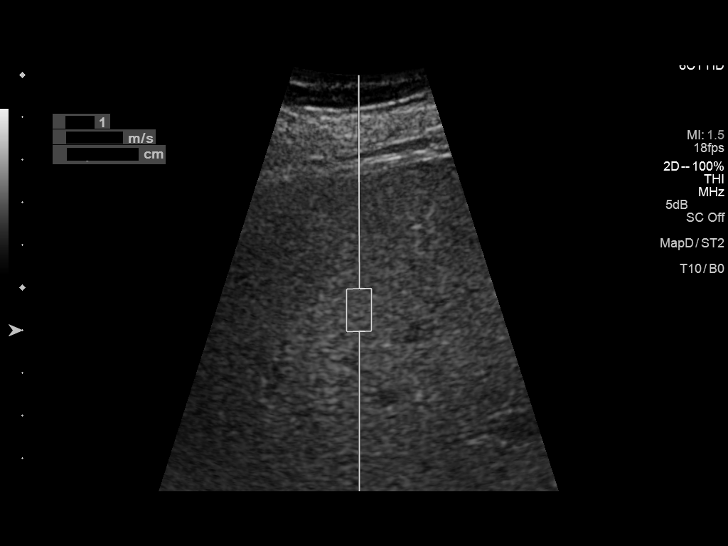
[im 11/13]
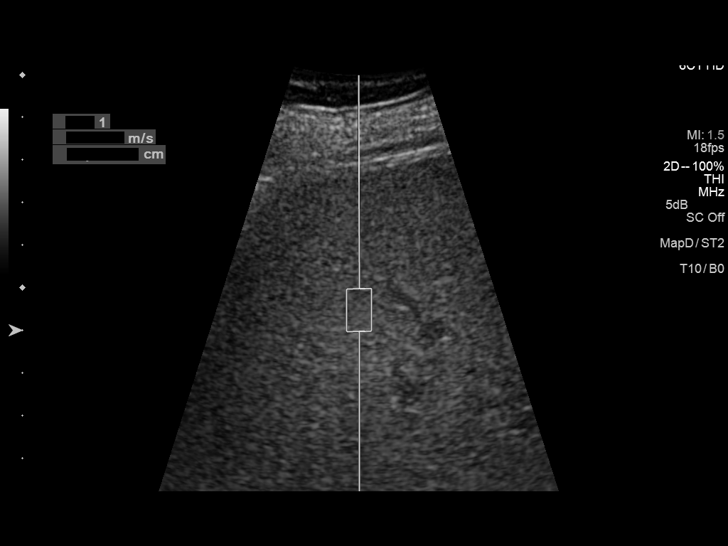
[im 12/13]
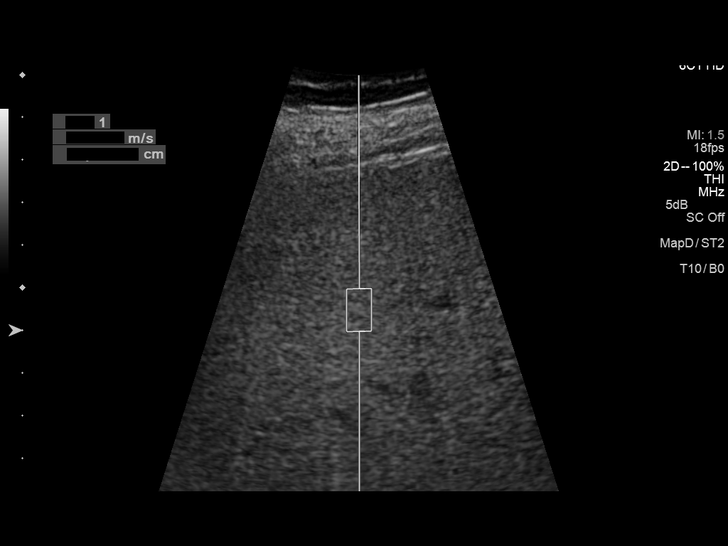
[im 13/13]
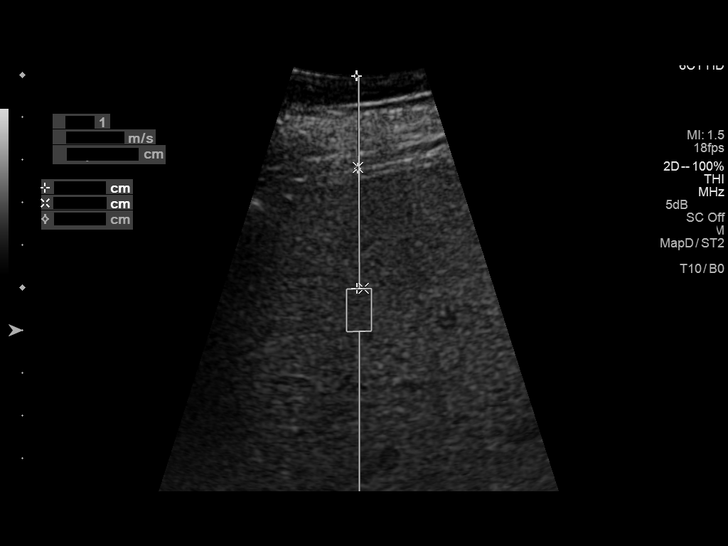

[13 of 13 positions shown; findings below may reference images not displayed]

FINDINGS: Device: Siemens Helix VTQ

Patient position: Supine

Transducer 6C1

Number of measurements: 10

Hepatic segment:  8

Median velocity:   1.96  m/sec

IQR:

IQR/Median velocity ratio: 0.14796

Corresponding Metavir fibrosis score:  F2-F3

Risk of fibrosis: Moderate

Limitations of exam: Breath hold.

Pertinent findings noted on other imaging exams:  None

Please note that abnormal shear wave velocities may also be
identified in clinical settings other than with hepatic fibrosis,
such as: acute hepatitis, elevated right heart and central venous
pressures including use of beta blockers, Sorin disease
([place]), infiltrative processes such as
mastocytosis/amyloidosis/infiltrative tumor, extrahepatic
cholestasis, in the post-prandial state, and liver transplantation.
Correlation with patient history, laboratory data, and clinical
condition recommended.
IMPRESSION: Median hepatic shear wave velocity is calculated at 1.96 m/sec.

Corresponding Metavir fibrosis score is  F2-F3.

Risk of fibrosis is moderate.

Follow-up: Additional testing appropriate

## 2017-07-08 ENCOUNTER — Ambulatory Visit
Admission: RE | Admit: 2017-07-08 | Discharge: 2017-07-08 | Disposition: A | Discharge status: Home / Self Care | Payer: Medicare Other | Source: Ambulatory Visit | Attending: Family Medicine | Admitting: Family Medicine

## 2017-07-08 DIAGNOSIS — Z1231 Encounter for screening mammogram for malignant neoplasm of breast: Secondary | ICD-10-CM

## 2017-11-21 IMAGING — CT CT CHEST W/ CM
2 of 3 series · 15 of 36 positions shown, 18 images · IV contrast (omnipaque)
Comparison: 08/02/2013 chest x-ray and CT scan [DATE]

CLINICAL DATA: Cough and congestion, COPD, abnormal chest x-ray

EXAM:
CT CHEST WITH CONTRAST
TECHNIQUE: Multidetector CT imaging of the chest was performed during
intravenous contrast administration.
CONTRAST:  75mL OMNIPAQUE IOHEXOL 300 MG/ML  SOLN

[Series 2: routine chest with · axial · 0.69mm/px · z∈[-616,-362]mm · 12 of 61 slices shown, 15 images]
[im 5/61  mediastinal]
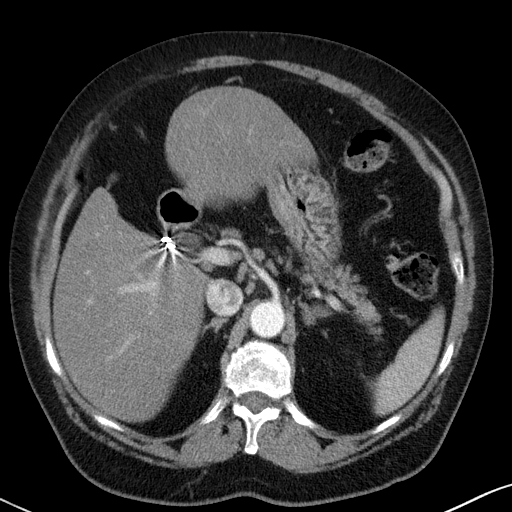
[im 5/61  lung]
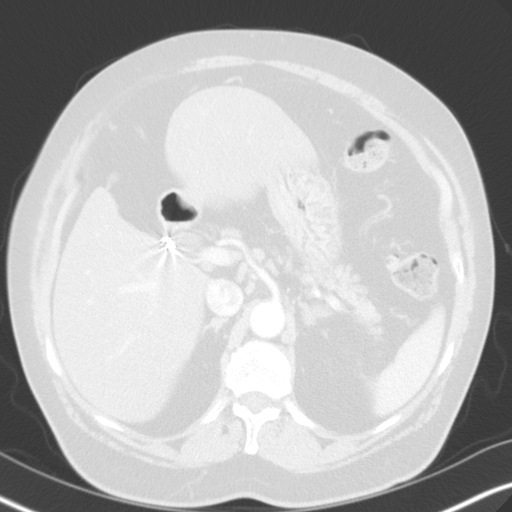
[im 9/61  lung]
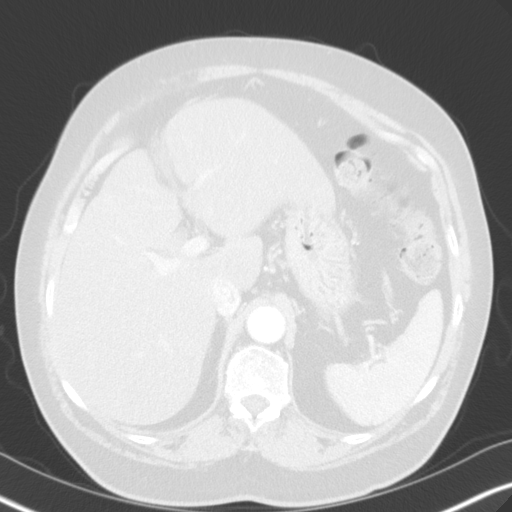
[im 14/61  lung]
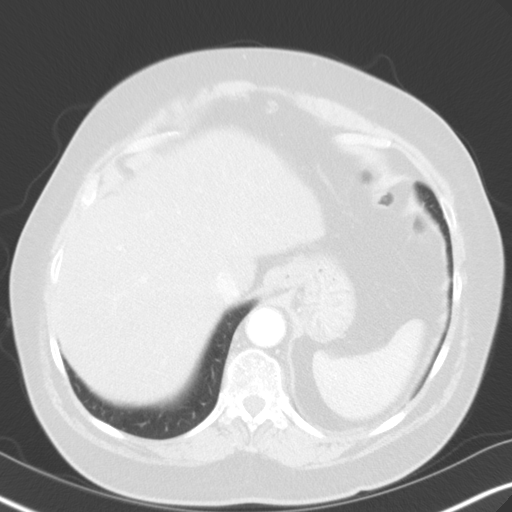
[im 18/61  lung]
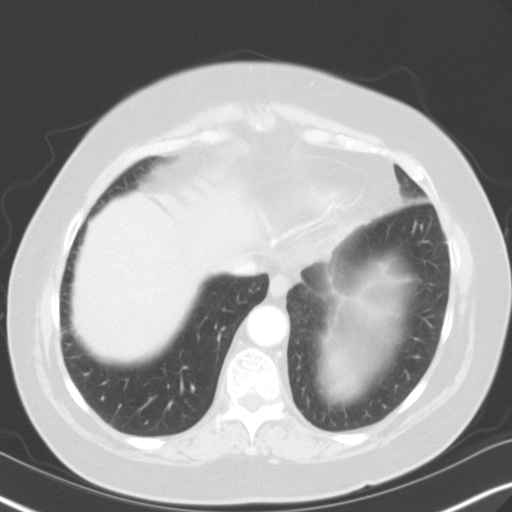
[im 23/61  mediastinal]
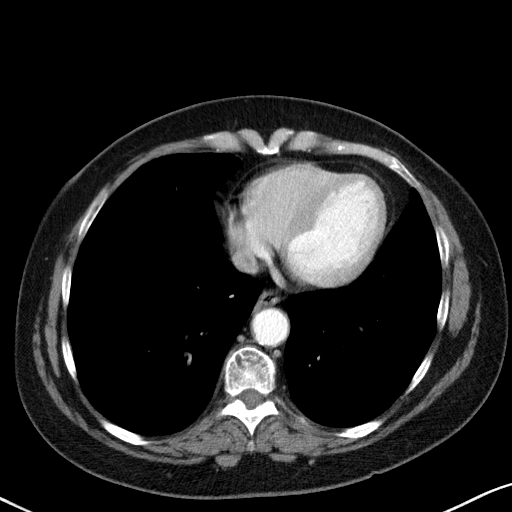
[im 23/61  lung]
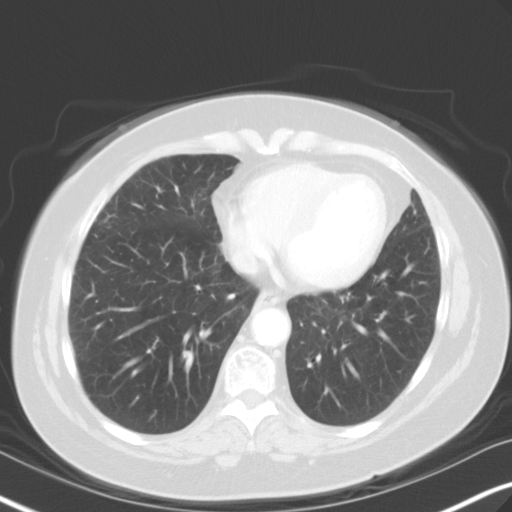
[im 27/61  lung]
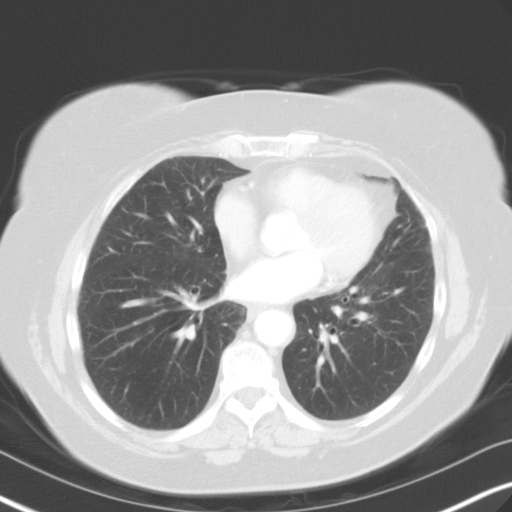
[im 34/61  lung]
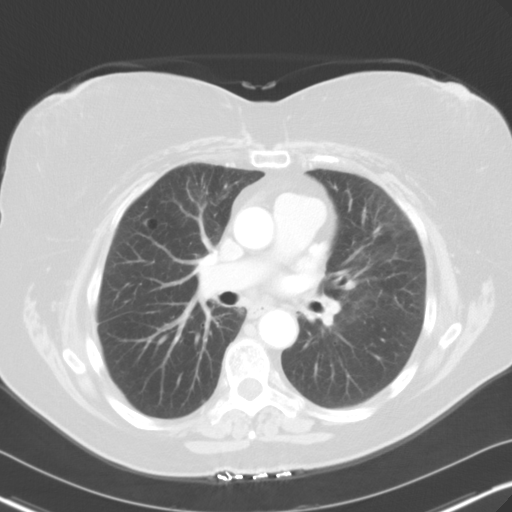
[im 38/61  lung]
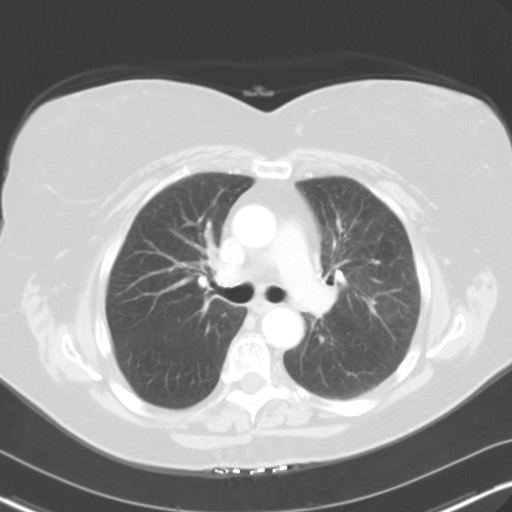
[im 43/61  mediastinal]
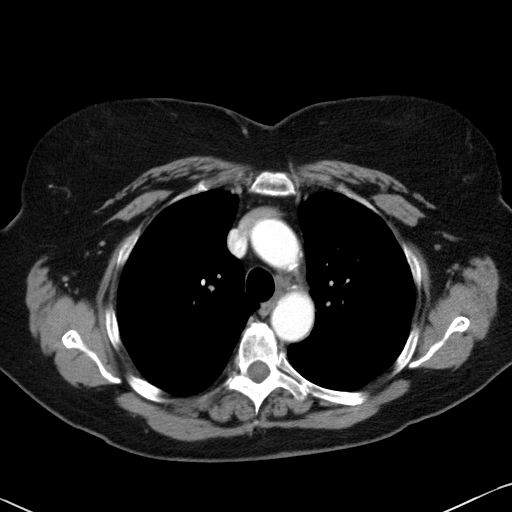
[im 43/61  lung]
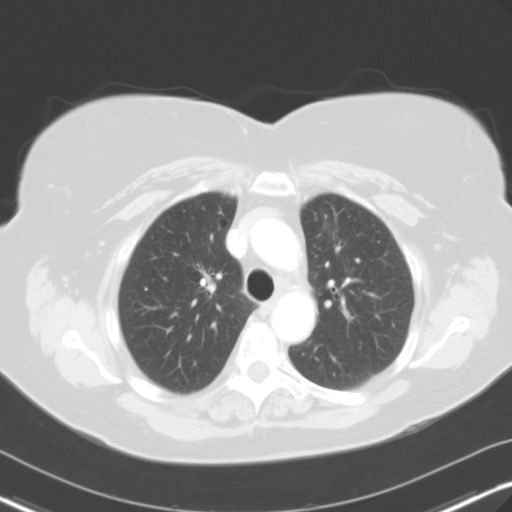
[im 47/61  lung]
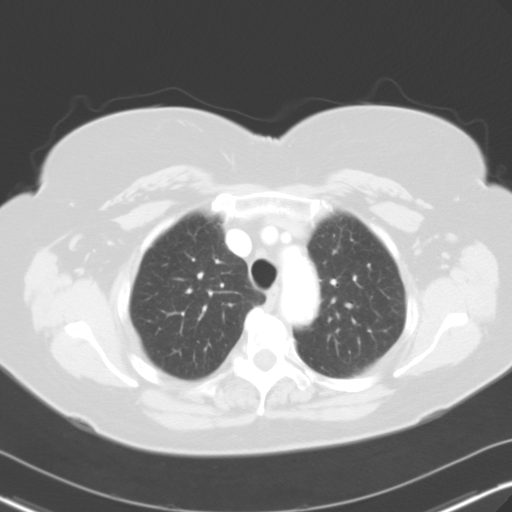
[im 52/61  lung]
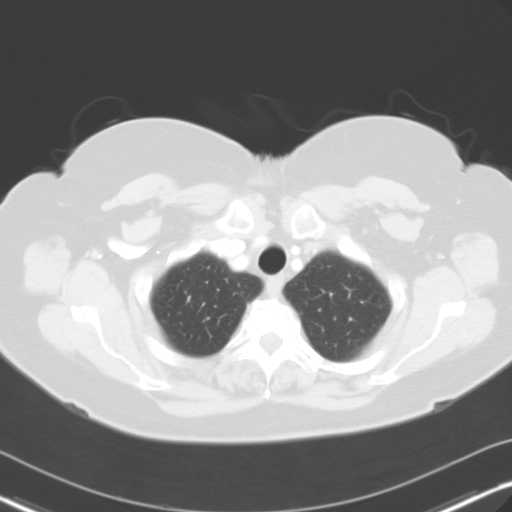
[im 56/61  lung]
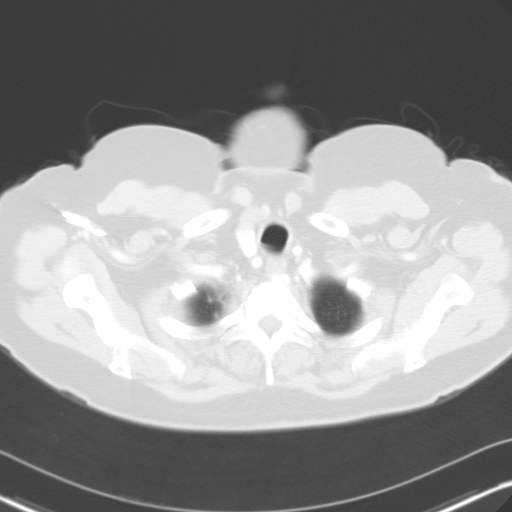

[Series 5: cor routine chest with · coronal · 0.62mm/px · 3 of 174 slices shown]
[im 35/174  lung]
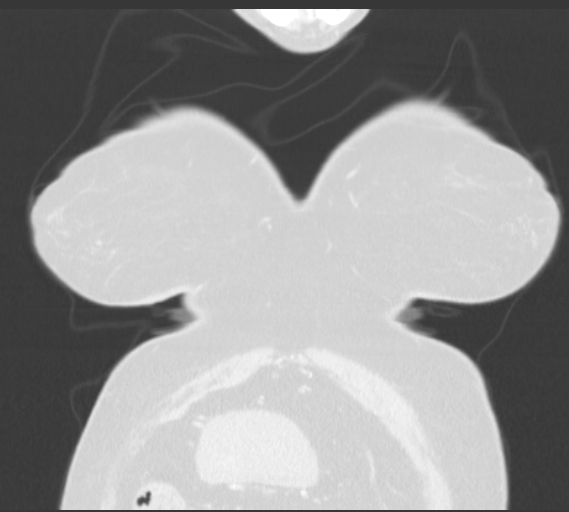
[im 70/174  lung]
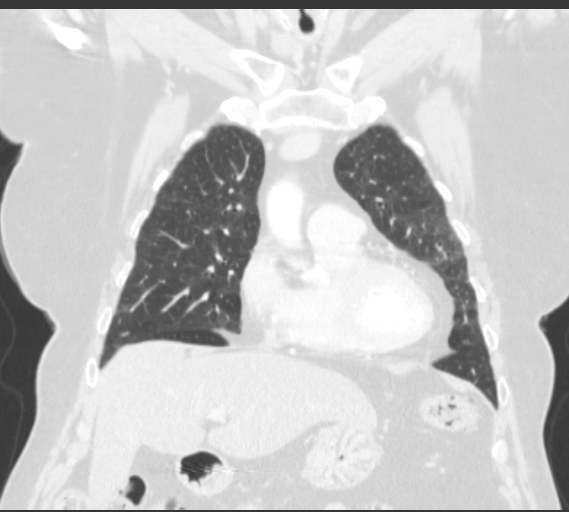
[im 104/174  lung]
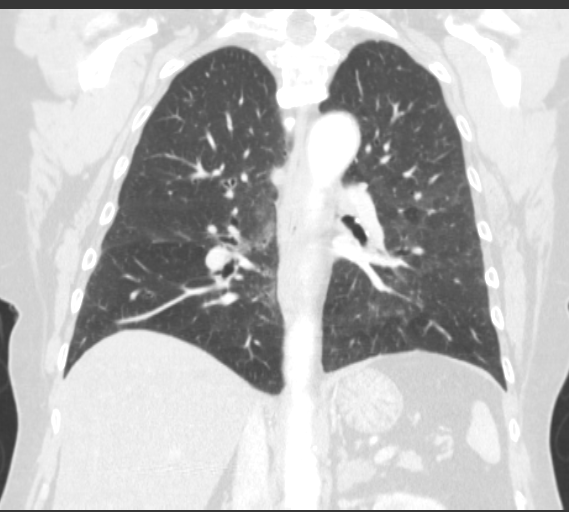

[15 of 36 positions shown; findings below may reference images not displayed]

FINDINGS: Sagittal images of the spine shows degenerative changes with
anterior spurring upper thoracic spine. Minimal degenerative changes
mid thoracic spine. Sagittal view of the sternum is unremarkable.
Mild atherosclerotic calcifications of thoracic aorta.

Minimal hyperinflation.

Central airways are patent. No mediastinal hematoma or adenopathy.
Heart size within normal limits. No hilar adenopathy.

The visualized upper abdomen shows fatty infiltration of the liver.
Status postcholecystectomy. Stable left adrenal at the adenoma
measures 1.9 x 1.3 cm. A cyst in right hepatic lobe posteriorly
measures 7.5 mm stable in size in appearance.

A 3 mm nodule is noted in left lower lobe medially see axial image
33.

Two small emphysematous bullae are noted in right middle lobe the
largest measures 8.5 mm. Single central emphysematous bulla is noted
in left lower lobe. Tiny medial emphysematous bulla is noted in
right lower lobe. There is no acute infiltrate or pulmonary edema.
No pneumothorax.
IMPRESSION: 1. Minimal hyperinflation. Very mild emphysematous changes. A 3 mm
nodule is noted in left lower lobe medially see axial image 33. If
the patient is at high risk for bronchogenic carcinoma, follow-up
chest CT at 1 year is recommended. If the patient is at low risk, no
follow-up is needed. This recommendation follows the consensus
statement: Guidelines for Management of Small Pulmonary Nodules
Detected on CT Scans: A Statement from the [HOSPITAL] as
published in Radiology 9113; [DATE].
2. No acute infiltrate or pulmonary edema.
3. Mild fatty infiltration of the liver. Stable small cyst in right
hepatic lobe posteriorly. Stable left adrenal gland adenoma.
4. Mild degenerative changes upper thoracic spine.

## 2018-03-01 NOTE — Progress Notes (Signed)
03/02/2018 12:15 PM   Brooke King Sep 05, 1941 [id number]  Referring provider: Derinda Late, MD [phone] [address] [facility] [place], [place] [address]  Chief Complaint  Patient presents with  . Bladder Leakage    HPI: Patient is a 77 -year-old Caucasian female who is a former patient of Dr. Bjorn Loser who would like to establish care locally for urinary incontinence.  Patient states that she has had urinary incontinence for a while.  It has been getting worse for the last two months.    Patient has incontinence with urge and stress.  She is experiencing several incontinent episodes during the day. She is experiencing two incontinent episodes during the night.  Her incontinence volume is large.   She is wearing 2 pads/depends daily.    She is having associated urinary frequency, urgency, nocturia and intermittency.  She does not have a history of urinary tract infections or injury to the bladder.   She denies dysuria, gross hematuria, suprapubic pain, back pain, abdominal pain or flank pain.  She has not had any recent fevers, chills, nausea or vomiting.   PVR is 0 mL.    She does not have a history of nephrolithiasis, GU surgery or GU trauma.   She is post menopausal.   She denies constipation and/or diarrhea.   She is drinking 2 bottles of water daily.   She is drinking 4 to 5 cups of coffee daily.  She is smoking.  She is not drinking  alcoholic beverages daily.   One caffeine free Pepsi daily.     PMH: Past Medical History:  Diagnosis Date  . B12 deficiency   . Chest pain syndrome   . COPD (chronic obstructive pulmonary disease) ([place])   . Depression   . GERD (gastroesophageal reflux disease)   . History of colonic polyps   . History of degenerative disc disease    c-spine, status post cervical laminectomy  . Hypertension   . Hypothyroidism   . Insomnia   . Migraine   . Restless leg syndrome   . Seasonal allergies   . Thyroid  cancer [facility])     Surgical History: Past Surgical History:  Procedure Laterality Date  . ABDOMINAL HYSTERECTOMY    . CERVICAL LAMINECTOMY  08/2004  . CHOLECYSTECTOMY    . COLONOSCOPY WITH PROPOFOL N/A 11/18/2015   Procedure: COLONOSCOPY WITH PROPOFOL;  Surgeon: Hulen Luster, MD;  Location: [facility] ENDOSCOPY;  Service: Gastroenterology;  Laterality: N/A;  . ESOPHAGOGASTRODUODENOSCOPY (EGD) WITH PROPOFOL N/A 11/18/2015   Procedure: ESOPHAGOGASTRODUODENOSCOPY (EGD) WITH PROPOFOL;  Surgeon: Hulen Luster, MD;  Location: [facility] ENDOSCOPY;  Service: Gastroenterology;  Laterality: N/A;  . HEMORRHOID SURGERY    . TUBAL LIGATION      Home Medications:  Allergies as of 03/02/2018      Reactions   Cortisone    Hydrocodone    Keflex [cephalexin]    Septra [sulfamethoxazole-trimethoprim]    Tagamet [cimetidine]    Zoloft [sertraline]    Penicillins Rash      Medication List        Accurate as of 03/02/18 12:15 PM. Always use your most recent med list.          albuterol 108 (90 Base) MCG/ACT inhaler Commonly known as:  PROVENTIL HFA;VENTOLIN HFA Inhale into the lungs every 6 (six) hours as needed for wheezing or shortness of breath.   ALPRAZolam 0.5 MG tablet Commonly known as:  XANAX Take 0.5 mg by  mouth at bedtime as needed for anxiety.   aspirin EC 81 MG tablet Take 81 mg by mouth daily.   azelastine 0.1 % nasal spray Commonly known as:  ASTELIN Place into both nostrils 2 (two) times daily. Use in each nostril as directed   Calcium + D3 600-200 MG-UNIT Tabs Take by mouth.   cetirizine 10 MG tablet Commonly known as:  ZYRTEC Take 10 mg by mouth daily.   fesoterodine 8 MG Tb24 tablet Commonly known as:  TOVIAZ Take 1 tablet (8 mg total) by mouth daily.   fluticasone 50 MCG/ACT nasal spray Commonly known as:  FLONASE Place into both nostrils daily.   latanoprost 0.005 % ophthalmic solution Commonly known as:  XALATAN 1 drop at bedtime.   levothyroxine 150 MCG tablet Commonly  known as:  SYNTHROID, LEVOTHROID Take 150 mcg by mouth daily before breakfast.   montelukast 10 MG tablet Commonly known as:  SINGULAIR Take 10 mg by mouth at bedtime.   omeprazole 20 MG tablet Commonly known as:  PRILOSEC OTC Take 20 mg by mouth daily.   valsartan 80 MG tablet Commonly known as:  DIOVAN Take 80 mg by mouth daily.   vitamin B-12 1000 MCG tablet Commonly known as:  CYANOCOBALAMIN Take 1,000 mcg by mouth daily.       Allergies:  Allergies  Allergen Reactions  . Cortisone   . Hydrocodone   . Keflex [Cephalexin]   . Septra [Sulfamethoxazole-Trimethoprim]   . Tagamet [Cimetidine]   . Zoloft [Sertraline]   . Penicillins Rash    Family History: Family History  Problem Relation Age of Onset  . Breast cancer Neg Hx     Social History:  reports that she has been smoking.  She has never used smokeless tobacco. She reports that she does not drink alcohol or use drugs.  ROS: UROLOGY Frequent Urination?: Yes Hard to postpone urination?: Yes Burning/pain with urination?: No Get up at night to urinate?: Yes Leakage of urine?: Yes Urine stream starts and stops?: Yes Trouble starting stream?: No Do you have to strain to urinate?: No Blood in urine?: No Urinary tract infection?: No Sexually transmitted disease?: No Injury to kidneys or bladder?: No Painful intercourse?: No Weak stream?: No Currently pregnant?: No Vaginal bleeding?: No Last menstrual period?: n  Gastrointestinal Nausea?: No Vomiting?: No Indigestion/heartburn?: Yes Diarrhea?: No Constipation?: No  Constitutional Fever: No Night sweats?: No Weight loss?: No Fatigue?: No  Skin Skin rash/lesions?: No Itching?: No  Eyes Blurred vision?: No Double vision?: No  Ears/Nose/Throat Sore throat?: Yes Sinus problems?: Yes  Hematologic/Lymphatic Swollen glands?: No Easy bruising?: Yes  Cardiovascular Leg swelling?: No Chest pain?: No  Respiratory Cough?: Yes Shortness  of breath?: Yes  Endocrine Excessive thirst?: Yes  Musculoskeletal Back pain?: Yes Joint pain?: Yes  Neurological Headaches?: Yes Dizziness?: No  Psychologic Depression?: No Anxiety?: Yes  Physical Exam: BP 119/69 (BP Location: Left Arm, Patient Position: Sitting, Cuff Size: Normal)   Pulse 72   Ht 5\' 3"  (1.6 m)   Wt 180 lb 4.8 oz (81.8 kg)   BMI 31.94 kg/m   Constitutional: Well nourished. Alert and oriented, No acute distress. HEENT: [place] AT, moist mucus membranes. Trachea midline, no masses. Cardiovascular: No clubbing, cyanosis, or edema. Respiratory: Normal respiratory effort, no increased work of breathing. GI: Abdomen is soft, non tender, non distended, no abdominal masses. Liver and spleen not palpable.  No hernias appreciated.  Stool sample for occult testing is not indicated.   GU: No CVA tenderness.  No bladder fullness or masses.  Atrophic external genitalia, normal pubic hair distribution, no lesions.  Normal urethral meatus, no lesions, no prolapse, no discharge.   No urethral masses, tenderness and/or tenderness. No bladder fullness, tenderness or masses. Pale vagina mucosa, poor estrogen effect, no discharge, no lesions, Poor pelvic support,  Grade II cystocele.  Rectocele noted.  Cervix, uterus and adnexa are surgically absent.  Anus and perineum are without rashes or lesions.    Skin: No rashes, bruises or suspicious lesions. Lymph: No cervical or inguinal adenopathy. Neurologic: Grossly intact, no focal deficits, moving all 4 extremities. Psychiatric: Normal mood and affect.  Laboratory Data: No results found for: WBC, HGB, HCT, MCV, PLT  Lab Results  Component Value Date   CREATININE 0.80 10/02/2016    No results found for: PSA  No results found for: TESTOSTERONE  No results found for: HGBA1C  No results found for: TSH  No results found for: CHOL, HDL, CHOLHDL, VLDL, LDLCALC  No results found for: AST No results found for: ALT No components  found for: ALKALINEPHOPHATASE No components found for: BILIRUBINTOTAL  No results found for: ESTRADIOL  Urinalysis No results found for: COLORURINE, APPEARANCEUR, LABSPEC, PHURINE, GLUCOSEU, HGBUR, BILIRUBINUR, KETONESUR, PROTEINUR, UROBILINOGEN, NITRITE, LEUKOCYTESUR  I have reviewed the labs.   Pertinent Imaging: Results for AHUVA, POYNOR (MRN [id number]) as of 03/02/2018 12:00  Ref. Range 03/02/2018 11:35  Scan Result Unknown 0    Assessment & Plan:    1. Incontinence Discussed behavioral therapies, bladder training, bladder control strategies and pelvic floor muscle training would like referral to PT Discussed fluid management  Discussed decreasing coffee intake Encouraged patient to stop smoking Myrbetriq was cost prohibitive Would like to try anticholinergic therapy, Toviaz 8mg  samples, # 28.   Advised of the side effects, such as: Dry eyes, dry mouth, constipation, mental confusion and/or urinary retention.  RTC in 3 weeks for PVR and symptom recheck   2. Cystocele/SUI  Refer to PT for pelvic floor strengthening  Return in about 3 weeks (around 03/23/2018) for PVR and OAB questionnaire.  These notes generated with voice recognition software. I apologize for typographical errors.  Zara Council, [facility] [address], [place], [place] [address] [phone]

## 2018-03-02 ENCOUNTER — Ambulatory Visit (INDEPENDENT_AMBULATORY_CARE_PROVIDER_SITE_OTHER): Payer: Medicare Other | Admitting: Urology

## 2018-03-02 ENCOUNTER — Encounter: Payer: Self-pay | Admitting: Urology

## 2018-03-02 VITALS — BP 119/69 | HR 72 | Ht 63.0 in | Wt 180.3 lb

## 2018-03-02 DIAGNOSIS — N3946 Mixed incontinence: Secondary | ICD-10-CM

## 2018-03-02 DIAGNOSIS — N8111 Cystocele, midline: Secondary | ICD-10-CM

## 2018-03-02 LAB — BLADDER SCAN AMB NON-IMAGING: Scan Result: 0

## 2018-03-02 MED ORDER — FESOTERODINE FUMARATE ER 8 MG PO TB24: 8.0000 mg | ORAL_TABLET | Freq: Every day | ORAL | 0 refills | Status: DC

## 2018-03-23 ENCOUNTER — Ambulatory Visit: Payer: Medicare Other | Admitting: Urology

## 2018-04-06 NOTE — Progress Notes (Signed)
04/07/2018 11:16 AM   Brooke King Dema Severin 06-11-1941 [id number]  Referring provider: Derinda Late, MD [phone] [address] [place] [facility] [place], [place] [address]  Chief Complaint  Patient presents with  . Urinary Incontinence    HPI: Patient is a 77 year old Caucasian female with urinary incontinence and a cystocele who was initiated on Toviaz 3 weeks ago presents today for follow-up.  Background history Patient is a 70 -year-old Caucasian female who is a former patient of Dr. Bjorn Loser who would like to establish care locally for urinary incontinence.  Patient states that she has had urinary incontinence for a while.  It has been getting worse for the last two months.  Patient has incontinence with urge and stress.  She is experiencing several incontinent episodes during the day. She is experiencing two incontinent episodes during the night.  Her incontinence volume is large.   She is wearing 2 pads/depends daily.  She is having associated urinary frequency, urgency, nocturia and intermittency.  She does not have a history of urinary tract infections or injury to the bladder.   She denies dysuria, gross hematuria, suprapubic pain, back pain, abdominal pain or flank pain.  She has not had any recent fevers, chills, nausea or vomiting.   PVR is 0 mL.  She does not have a history of nephrolithiasis, GU surgery or GU trauma.  She is post menopausal.  She denies constipation and/or diarrhea.  She is drinking 2 bottles of water daily.   She is drinking 4 to 5 cups of coffee daily.  She is smoking.  She is not drinking  alcoholic beverages daily.   One caffeine free Pepsi daily.    The patient has been experiencing urgency x 8 or more, frequency x 8 or more , not restricting fluids to avoid visits to the restroom, is engaging in toilet mapping, incontinence x 4-7  and nocturia x 0-3. Her PVR is 36 mL   Patient denies any gross hematuria, dysuria or suprapubic/flank pain.   Patient denies any fevers, chills, nausea or vomiting.    Patient could not tolerate the Toviaz as it caused her throat to swell up.  She would like to return to the Myrbetriq.   PMH: Past Medical History:  Diagnosis Date  . B12 deficiency   . Chest pain syndrome   . COPD (chronic obstructive pulmonary disease) ([place])   . Depression   . GERD (gastroesophageal reflux disease)   . History of colonic polyps   . History of degenerative disc disease    c-spine, status post cervical laminectomy  . Hypertension   . Hypothyroidism   . Insomnia   . Migraine   . Restless leg syndrome   . Seasonal allergies   . Thyroid cancer [facility])     Surgical History: Past Surgical History:  Procedure Laterality Date  . ABDOMINAL HYSTERECTOMY    . CERVICAL LAMINECTOMY  08/2004  . CHOLECYSTECTOMY    . COLONOSCOPY WITH PROPOFOL N/A 11/18/2015   Procedure: COLONOSCOPY WITH PROPOFOL;  Surgeon: Hulen Luster, MD;  Location[facility] ENDOSCOPY;  Service: Gastroenterology;  Laterality: N/A;  . ESOPHAGOGASTRODUODENOSCOPY (EGD) WITH PROPOFOL N/A 11/18/2015   Procedure: ESOPHAGOGASTRODUODENOSCOPY (EGD) WITH PROPOFOL;  Surgeon: Hulen Luster, MD;  Location: [facility] ENDOSCOPY;  Service: Gastroenterology;  Laterality: N/A;  . HEMORRHOID SURGERY    . TUBAL LIGATION      Home Medications:  Allergies as of 04/07/2018      Reactions   Cortisone  Hydrocodone    Keflex [cephalexin]    Septra [sulfamethoxazole-trimethoprim]    Tagamet [cimetidine]    Zoloft [sertraline]    Penicillins Rash      Medication List        Accurate as of 04/07/18 11:16 AM. Always use your most recent med list.          albuterol 108 (90 Base) MCG/ACT inhaler Commonly known as:  PROVENTIL HFA;VENTOLIN HFA Inhale into the lungs every 6 (six) hours as needed for wheezing or shortness of breath.   ALPRAZolam 0.5 MG tablet Commonly known as:  XANAX Take 0.5 mg by mouth at bedtime as needed for anxiety.   aspirin EC 81 MG tablet Take 81 mg  by mouth daily.   azelastine 0.1 % nasal spray Commonly known as:  ASTELIN Place into both nostrils 2 (two) times daily. Use in each nostril as directed   Calcium + D3 600-200 MG-UNIT Tabs Take by mouth.   cetirizine 10 MG tablet Commonly known as:  ZYRTEC Take 10 mg by mouth daily.   fesoterodine 8 MG Tb24 tablet Commonly known as:  TOVIAZ Take 1 tablet (8 mg total) by mouth daily.   fluticasone 50 MCG/ACT nasal spray Commonly known as:  FLONASE Place into both nostrils daily.   latanoprost 0.005 % ophthalmic solution Commonly known as:  XALATAN 1 drop at bedtime.   levothyroxine 150 MCG tablet Commonly known as:  SYNTHROID, LEVOTHROID Take 150 mcg by mouth daily before breakfast.   mirabegron ER 50 MG Tb24 tablet Commonly known as:  MYRBETRIQ Take 1 tablet (50 mg total) by mouth daily.   montelukast 10 MG tablet Commonly known as:  SINGULAIR Take 10 mg by mouth at bedtime.   omeprazole 20 MG tablet Commonly known as:  PRILOSEC OTC Take 20 mg by mouth daily.   valsartan 80 MG tablet Commonly known as:  DIOVAN Take 80 mg by mouth daily.   vitamin B-12 1000 MCG tablet Commonly known as:  CYANOCOBALAMIN Take 1,000 mcg by mouth daily.       Allergies:  Allergies  Allergen Reactions  . Cortisone   . Hydrocodone   . Keflex [Cephalexin]   . Septra [Sulfamethoxazole-Trimethoprim]   . Tagamet [Cimetidine]   . Zoloft [Sertraline]   . Penicillins Rash    Family History: Family History  Problem Relation Age of Onset  . Breast cancer Neg Hx     Social History:  reports that she has been smoking.  She has never used smokeless tobacco. She reports that she does not drink alcohol or use drugs.  ROS: UROLOGY Frequent Urination?: Yes Hard to postpone urination?: No Burning/pain with urination?: No Get up at night to urinate?: No Leakage of urine?: No Urine stream starts and stops?: No Trouble starting stream?: No Do you have to strain to urinate?:  No Blood in urine?: No Urinary tract infection?: No Sexually transmitted disease?: No Injury to kidneys or bladder?: No Painful intercourse?: No Weak stream?: No Currently pregnant?: No Vaginal bleeding?: No  Gastrointestinal Nausea?: No Vomiting?: No Indigestion/heartburn?: No Diarrhea?: No Constipation?: No  Constitutional Fever: No Night sweats?: No Weight loss?: No Fatigue?: No  Skin Skin rash/lesions?: No Itching?: No  Eyes Blurred vision?: No Double vision?: No  Ears/Nose/Throat Sore throat?: No Sinus problems?: Yes  Hematologic/Lymphatic Swollen glands?: No Easy bruising?: No  Cardiovascular Leg swelling?: No Chest pain?: No  Respiratory Cough?: Yes Shortness of breath?: Yes  Endocrine Excessive thirst?: No  Musculoskeletal Back pain?: No Joint pain?:  Yes  Neurological Headaches?: No Dizziness?: No  Psychologic Depression?: No Anxiety?: No  Physical Exam: BP 111/71   Pulse 76   Wt 182 lb (82.6 kg)   BMI 32.24 kg/m   Constitutional: Well nourished. Alert and oriented, No acute distress. HEENT: [place] AT, moist mucus membranes. Trachea midline, no masses. Cardiovascular: No clubbing, cyanosis, or edema. Respiratory: Normal respiratory effort, no increased work of breathing. Skin: No rashes, bruises or suspicious lesions. Lymph: No cervical or inguinal adenopathy. Neurologic: Grossly intact, no focal deficits, moving all 4 extremities. Psychiatric: Normal mood and affect.   Laboratory Data: No results found for: WBC, HGB, HCT, MCV, PLT  Lab Results  Component Value Date   CREATININE 0.80 10/02/2016    No results found for: PSA  No results found for: TESTOSTERONE  No results found for: HGBA1C  No results found for: TSH  No results found for: CHOL, HDL, CHOLHDL, VLDL, LDLCALC  No results found for: AST No results found for: ALT No components found for: ALKALINEPHOPHATASE No components found for: BILIRUBINTOTAL  No  results found for: ESTRADIOL  Urinalysis No results found for: COLORURINE, APPEARANCEUR, LABSPEC, PHURINE, GLUCOSEU, HGBUR, BILIRUBINUR, KETONESUR, PROTEINUR, UROBILINOGEN, NITRITE, LEUKOCYTESUR  I have reviewed the labs.   Pertinent Imaging: Results for GILBERT, NARAIN (MRN [id number]) as of 04/07/2018 11:12  Ref. Range 04/07/2018 10:50  Scan Result Unknown 54ml     Assessment & Plan:    1. Incontinence Discussed behavioral therapies, bladder training, bladder control strategies Discussed fluid management  Discussed decreasing coffee intake Encouraged patient to stop smoking Could not tolerate the Toviaz - caused her throat to close up  Return to Myrbetriq 50 mg daily RTC in 3 weeks for PVR and OAB questionnaire   2. 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  These notes generated with voice recognition software. I apologize for typographical errors.  Zara Council, PA-C  [facility] [address] [place], [place] [address] [phone]

## 2018-04-07 ENCOUNTER — Ambulatory Visit: Payer: Medicare Other | Admitting: Urology

## 2018-04-07 ENCOUNTER — Encounter: Payer: Self-pay | Admitting: Urology

## 2018-04-07 ENCOUNTER — Other Ambulatory Visit: Payer: Self-pay

## 2018-04-07 VITALS — BP 111/71 | HR 76 | Wt 182.0 lb

## 2018-04-07 DIAGNOSIS — N8111 Cystocele, midline: Secondary | ICD-10-CM

## 2018-04-07 DIAGNOSIS — D3500 Benign neoplasm of unspecified adrenal gland: Secondary | ICD-10-CM

## 2018-04-07 DIAGNOSIS — N3946 Mixed incontinence: Secondary | ICD-10-CM

## 2018-04-07 LAB — BLADDER SCAN AMB NON-IMAGING

## 2018-04-07 MED ORDER — MIRABEGRON ER 50 MG PO TB24: 50.0000 mg | ORAL_TABLET | Freq: Every day | ORAL | 3 refills | Status: DC

## 2018-04-27 NOTE — Progress Notes (Signed)
04/28/2018 10:33 AM   Brooke King [birth year] [id number]  Referring provider: Derinda Late, MD [phone] [facility] [place], [place] [address]  Chief Complaint  Patient presents with  . Follow-up    PVR/bladder scan    HPI: Patient is a 77 year old Caucasian female with urinary incontinence and a cystocele who was initiated on Myrbetriq 50 mg daily 3 weeks ago presents today for follow-up.  Background history Patient is a 56 -year-old Caucasian female who is a former patient of Dr. Bjorn Loser who would like to establish care locally for urinary incontinence.  Patient states that she has had urinary incontinence for a while.  It has been getting worse for the last two months.  Patient has incontinence with urge and stress.  She is experiencing several incontinent episodes during the day. She is experiencing two incontinent episodes during the night.  Her incontinence volume is large.   She is wearing 2 pads/depends daily.  She is having associated urinary frequency, urgency, nocturia and intermittency.  She does not have a history of urinary tract infections or injury to the bladder.   She denies dysuria, gross hematuria, suprapubic pain, back pain, abdominal pain or flank pain.  She has not had any recent fevers, chills, nausea or vomiting.   PVR is 0 mL.  She does not have a history of nephrolithiasis, GU surgery or GU trauma.  She is post menopausal.  She denies constipation and/or diarrhea.  She is drinking 2 bottles of water daily.   She is drinking 4 to 5 cups of coffee daily.  She is smoking.  She is not drinking  alcoholic beverages daily.   One caffeine free Pepsi daily.    Patient could not tolerate the Toviaz as it caused her throat to swell up.  The patient has been experiencing urgency x 4-7 (improved), frequency x 8 or more (stable) , not restricting fluids to avoid visits to the restroom, is engaging in toilet mapping, incontinence x  0-3 (improved) and nocturia x 0-3 (stable). Her PVR is 0 mL   BP is 143/75.   Patient denies any gross hematuria, dysuria or suprapubic/flank pain.  Patient denies any fevers, chills, nausea or vomiting.    PMH: Past Medical History:  Diagnosis Date  . B12 deficiency   . Chest pain syndrome   . COPD (chronic obstructive pulmonary disease) ([place])   . Depression   . GERD (gastroesophageal reflux disease)   . History of colonic polyps   . History of degenerative disc disease    c-spine, status post cervical laminectomy  . Hypertension   . Hypothyroidism   . Insomnia   . Migraine   . Restless leg syndrome   . Seasonal allergies   . Thyroid cancer [facility])     Surgical History: Past Surgical History:  Procedure Laterality Date  . ABDOMINAL HYSTERECTOMY    . CERVICAL LAMINECTOMY  08/2004  . CHOLECYSTECTOMY    . COLONOSCOPY WITH PROPOFOL N/A 11/18/2015   Procedure: COLONOSCOPY WITH PROPOFOL;  Surgeon: Hulen Luster, MD;  Location: [facility] ENDOSCOPY;  Service: Gastroenterology;  Laterality: N/A;  . ESOPHAGOGASTRODUODENOSCOPY (EGD) WITH PROPOFOL N/A 11/18/2015   Procedure: ESOPHAGOGASTRODUODENOSCOPY (EGD) WITH PROPOFOL;  Surgeon: Hulen Luster, MD;  Location[facility] ENDOSCOPY;  Service: Gastroenterology;  Laterality: N/A;  . HEMORRHOID SURGERY    . TUBAL LIGATION      Home Medications:  Allergies as of 04/28/2018      Reactions  Toviaz [fesoterodine Fumarate Er] Other (See Comments)   Throat closed   Cortisone    Hydrocodone    Keflex [cephalexin]    Septra [sulfamethoxazole-trimethoprim]    Tagamet [cimetidine]    Zoloft [sertraline]    Penicillins Rash      Medication List        Accurate as of 04/28/18 10:33 AM. Always use your most recent med list.          albuterol 108 (90 Base) MCG/ACT inhaler Commonly known as:  PROVENTIL HFA;VENTOLIN HFA Inhale into the lungs every 6 (six) hours as needed for wheezing or shortness of breath.   ALPRAZolam 0.5 MG tablet Commonly known as:   XANAX Take 0.5 mg by mouth at bedtime as needed for anxiety.   aspirin EC 81 MG tablet Take 81 mg by mouth daily.   azelastine 0.1 % nasal spray Commonly known as:  ASTELIN Place into both nostrils 2 (two) times daily. Use in each nostril as directed   Calcium + D3 600-200 MG-UNIT Tabs Take by mouth.   cetirizine 10 MG tablet Commonly known as:  ZYRTEC Take 10 mg by mouth daily.   fesoterodine 8 MG Tb24 tablet Commonly known as:  TOVIAZ Take 1 tablet (8 mg total) by mouth daily.   fluticasone 50 MCG/ACT nasal spray Commonly known as:  FLONASE Place into both nostrils daily.   latanoprost 0.005 % ophthalmic solution Commonly known as:  XALATAN 1 drop at bedtime.   levothyroxine 150 MCG tablet Commonly known as:  SYNTHROID, LEVOTHROID Take 150 mcg by mouth daily before breakfast.   mirabegron ER 50 MG Tb24 tablet Commonly known as:  MYRBETRIQ Take 1 tablet (50 mg total) by mouth daily.   montelukast 10 MG tablet Commonly known as:  SINGULAIR Take 10 mg by mouth at bedtime.   omeprazole 20 MG tablet Commonly known as:  PRILOSEC OTC Take 20 mg by mouth daily.   valsartan 80 MG tablet Commonly known as:  DIOVAN Take 80 mg by mouth daily.   vitamin B-12 1000 MCG tablet Commonly known as:  CYANOCOBALAMIN Take 1,000 mcg by mouth daily.       Allergies:  Allergies  Allergen Reactions  . Toviaz [Fesoterodine Fumarate Er] Other (See Comments)    Throat closed  . Cortisone   . Hydrocodone   . Keflex [Cephalexin]   . Septra [Sulfamethoxazole-Trimethoprim]   . Tagamet [Cimetidine]   . Zoloft [Sertraline]   . Penicillins Rash    Family History: Family History  Problem Relation Age of Onset  . Breast cancer Neg Hx     Social History:  reports that she has been smoking. She has never used smokeless tobacco. She reports that she does not drink alcohol or use drugs.  ROS: UROLOGY Frequent Urination?: No Hard to postpone urination?: No Burning/pain with  urination?: No Get up at night to urinate?: No Leakage of urine?: No Urine stream starts and stops?: No Trouble starting stream?: No Do you have to strain to urinate?: No Blood in urine?: No Urinary tract infection?: No Sexually transmitted disease?: No Injury to kidneys or bladder?: No Painful intercourse?: No Weak stream?: No Currently pregnant?: No Vaginal bleeding?: No Last menstrual period?: n  Gastrointestinal Nausea?: No Vomiting?: No Indigestion/heartburn?: No Diarrhea?: No Constipation?: No  Constitutional Fever: No Night sweats?: No Weight loss?: No Fatigue?: No  Skin Skin rash/lesions?: No Itching?: No  Eyes Blurred vision?: No Double vision?: No  Ears/Nose/Throat Sore throat?: No Sinus problems?: Yes  Hematologic/Lymphatic  Swollen glands?: No Easy bruising?: No  Cardiovascular Leg swelling?: No Chest pain?: No  Respiratory Cough?: Yes Shortness of breath?: Yes  Endocrine Excessive thirst?: No  Musculoskeletal Back pain?: No Joint pain?: Yes  Neurological Headaches?: Yes Dizziness?: No  Psychologic Depression?: No Anxiety?: No  Physical Exam: BP (!) 143/75   Pulse 79   Ht 5\' 3"  (1.6 m)   Wt 181 lb (82.1 kg)   BMI 32.06 kg/m   Constitutional: Well nourished. Alert and oriented, No acute distress. HEENT: [place] AT, moist mucus membranes. Trachea midline, no masses. Cardiovascular: No clubbing, cyanosis, or edema. Respiratory: Normal respiratory effort, no increased work of breathing. Skin: No rashes, bruises or suspicious lesions. Lymph: No cervical or inguinal adenopathy. Neurologic: Grossly intact, no focal deficits, moving all 4 extremities. Psychiatric: Normal mood and affect.   Laboratory Data: No results found for: WBC, HGB, HCT, MCV, PLT  Lab Results  Component Value Date   CREATININE 0.80 10/02/2016    No results found for: PSA  No results found for: TESTOSTERONE  No results found for: HGBA1C  No results  found for: TSH  No results found for: CHOL, HDL, CHOLHDL, VLDL, LDLCALC  No results found for: AST No results found for: ALT No components found for: ALKALINEPHOPHATASE No components found for: BILIRUBINTOTAL  No results found for: ESTRADIOL  Urinalysis No results found for: COLORURINE, APPEARANCEUR, LABSPEC, PHURINE, GLUCOSEU, HGBUR, BILIRUBINUR, KETONESUR, PROTEINUR, UROBILINOGEN, NITRITE, LEUKOCYTESUR  I have reviewed the labs.   Pertinent Imaging: Results for ONETHA, GAFFEY (MRN [id number]) as of 04/28/2018 10:31  Ref. Range 04/28/2018 10:23  Scan Result Unknown 79ml      Assessment & Plan:    1. Incontinence Discussed behavioral therapies, bladder training, bladder control strategies Discussed fluid management  Discussed decreasing coffee intake Encouraged patient to stop smoking Could not tolerate the Toviaz - caused her throat to close up  Return to Myrbetriq 50 mg daily RTC in 3 weeks for PVR and OAB questionnaire   2. 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  These notes generated with voice recognition software. I apologize for typographical errors.  Zara Council, PA-C  [facility] [address] [place], [place] [address] [phone]

## 2018-04-28 ENCOUNTER — Ambulatory Visit: Payer: Medicare Other | Admitting: Urology

## 2018-04-28 ENCOUNTER — Encounter: Payer: Self-pay | Admitting: Urology

## 2018-04-28 VITALS — BP 143/75 | HR 79 | Ht 63.0 in | Wt 181.0 lb

## 2018-04-28 DIAGNOSIS — N3946 Mixed incontinence: Secondary | ICD-10-CM | POA: Diagnosis not present

## 2018-04-28 DIAGNOSIS — D3500 Benign neoplasm of unspecified adrenal gland: Secondary | ICD-10-CM

## 2018-04-28 DIAGNOSIS — N8111 Cystocele, midline: Secondary | ICD-10-CM

## 2018-04-28 LAB — BLADDER SCAN AMB NON-IMAGING

## 2018-08-01 ENCOUNTER — Ambulatory Visit: Payer: Medicare Other | Admitting: Urology

## 2018-08-10 ENCOUNTER — Ambulatory Visit: Payer: Medicare Other | Admitting: Urology

## 2018-08-22 ENCOUNTER — Other Ambulatory Visit: Payer: Self-pay | Admitting: Family Medicine

## 2018-08-22 DIAGNOSIS — Z1231 Encounter for screening mammogram for malignant neoplasm of breast: Secondary | ICD-10-CM

## 2018-09-12 ENCOUNTER — Ambulatory Visit
Admission: RE | Admit: 2018-09-12 | Discharge: 2018-09-12 | Disposition: A | Discharge status: Home / Self Care | Payer: Medicare Other | Source: Ambulatory Visit | Attending: Family Medicine | Admitting: Family Medicine

## 2018-09-12 DIAGNOSIS — Z1231 Encounter for screening mammogram for malignant neoplasm of breast: Secondary | ICD-10-CM | POA: Diagnosis not present

## 2020-08-20 DIAGNOSIS — G4733 Obstructive sleep apnea (adult) (pediatric): Secondary | ICD-10-CM | POA: Diagnosis not present

## 2020-08-22 ENCOUNTER — Other Ambulatory Visit: Payer: Self-pay

## 2020-08-22 ENCOUNTER — Encounter: Payer: Self-pay | Admitting: Family Medicine

## 2020-08-22 ENCOUNTER — Ambulatory Visit (INDEPENDENT_AMBULATORY_CARE_PROVIDER_SITE_OTHER): Payer: Medicare Other | Admitting: Family Medicine

## 2020-08-22 VITALS — BP 116/47 | HR 92 | Temp 98.5°F | Resp 18 | Ht 62.0 in | Wt 179.6 lb

## 2020-08-22 DIAGNOSIS — E039 Hypothyroidism, unspecified: Secondary | ICD-10-CM | POA: Diagnosis not present

## 2020-08-22 DIAGNOSIS — Z7689 Persons encountering health services in other specified circumstances: Secondary | ICD-10-CM

## 2020-08-22 DIAGNOSIS — E1142 Type 2 diabetes mellitus with diabetic polyneuropathy: Secondary | ICD-10-CM

## 2020-08-22 DIAGNOSIS — J441 Chronic obstructive pulmonary disease with (acute) exacerbation: Secondary | ICD-10-CM | POA: Diagnosis not present

## 2020-08-22 DIAGNOSIS — I10 Essential (primary) hypertension: Secondary | ICD-10-CM

## 2020-08-22 DIAGNOSIS — J449 Chronic obstructive pulmonary disease, unspecified: Secondary | ICD-10-CM

## 2020-08-22 DIAGNOSIS — E785 Hyperlipidemia, unspecified: Secondary | ICD-10-CM | POA: Diagnosis not present

## 2020-08-22 DIAGNOSIS — G4733 Obstructive sleep apnea (adult) (pediatric): Secondary | ICD-10-CM

## 2020-08-22 DIAGNOSIS — J432 Centrilobular emphysema: Secondary | ICD-10-CM

## 2020-08-22 MED ORDER — LEVOTHYROXINE SODIUM 125 MCG PO TABS: 125.0000 ug | ORAL_TABLET | Freq: Every day | ORAL | 3 refills | Status: DC

## 2020-08-22 MED ORDER — FLUTICASONE-SALMETEROL 250-50 MCG/DOSE IN AEPB
1.0000 | INHALATION_SPRAY | Freq: Two times a day (BID) | RESPIRATORY_TRACT | 3 refills | Status: DC
Start: 2020-08-22 — End: 2020-10-29

## 2020-08-22 MED ORDER — GABAPENTIN 300 MG PO CAPS: 300.0000 mg | ORAL_CAPSULE | Freq: Every day | ORAL | 11 refills | Status: DC

## 2020-08-22 MED ORDER — ATORVASTATIN CALCIUM 10 MG PO TABS: 10.0000 mg | ORAL_TABLET | Freq: Every day | ORAL | 3 refills | Status: DC

## 2020-08-22 MED ORDER — PREDNISONE 20 MG PO TABS: ORAL_TABLET | ORAL | 0 refills | Status: AC

## 2020-08-22 MED ORDER — AZITHROMYCIN 250 MG PO TABS: ORAL_TABLET | ORAL | 0 refills | Status: DC

## 2020-08-22 MED ORDER — METFORMIN HCL 500 MG PO TABS: 500.0000 mg | ORAL_TABLET | Freq: Three times a day (TID) | ORAL | 3 refills | Status: DC | PRN

## 2020-08-22 MED ORDER — ALBUTEROL SULFATE HFA 108 (90 BASE) MCG/ACT IN AERS
1.0000 | INHALATION_SPRAY | Freq: Four times a day (QID) | RESPIRATORY_TRACT | 1 refills | Status: DC | PRN
Start: 2020-08-22 — End: 2020-10-29

## 2020-08-22 NOTE — Assessment & Plan Note (Signed)
New patient establishment at [facility] for primary care.  Previously followed with [facility] and reports was too far of a drive from her home for primary care.  Plan: 1. To have labs drawn in 3 months at follow up visit, last labs with [facility] 05/2020 and those labs reviewed with patient in clinic today.

## 2020-08-22 NOTE — Progress Notes (Signed)
Subjective:    Patient ID: Brooke King, female    DOB: 1941/04/04, 79 y.o.   MRN: [id number]  Brooke King is a 79 y.o. female presenting on 08/22/2020 for Establish Care (pt decided to switch provider because she needed a someone closer to were she leaves )   HPI  [name] presents to clinic to establish care for primary care services.  Reports her previous PCP was at [facility].  Records will not be requested, as are available in Care Everywhere.  Past medical, family, and surgical history reviewed w/ pt.  She has acute concerns today of cough with wheezing.  Reports history of COPD with exacerbations approximately 1-2x per year.  Denies fevers, sore throat, change in taste/smell, SOB, CP, abdominal pain, n/v/d.  Has not been tested for COVID.  No flowsheet data found.  Social History   Tobacco Use  . Smoking status: Current Every Day Smoker    Packs/day: 1.00    Years: 40.00    Pack years: 40.00  . Smokeless tobacco: Never Used  Substance Use Topics  . Alcohol use: No  . Drug use: No    Review of Systems  Constitutional: Negative.   HENT: Negative.   Eyes: Negative.   Respiratory: Positive for cough and wheezing. Negative for apnea, choking, chest tightness, shortness of breath and stridor.   Cardiovascular: Negative.   Gastrointestinal: Negative.   Endocrine: Negative.   Genitourinary: Negative.   Musculoskeletal: Negative.   Skin: Negative.   Allergic/Immunologic: Negative.   Neurological: Negative.   Hematological: Negative.   Psychiatric/Behavioral: Negative.    Per HPI unless specifically indicated above     Objective:    BP (!) 116/47 (BP Location: Left Arm, Patient Position: Sitting, Cuff Size: Normal)   Pulse 92   Temp 98.5 F (36.9 C) (Oral)   Resp 18   Ht 5\' 2"  (1.575 m)   Wt 179 lb 9.6 oz (81.5 kg)   SpO2 99%   BMI 32.85 kg/m   Wt Readings from Last 3 Encounters:  08/22/20 179 lb 9.6 oz (81.5 kg)  04/28/18 181 lb (82.1 kg)   04/07/18 182 lb (82.6 kg)    Physical Exam Vitals and nursing note reviewed.  Constitutional:      General: She is not in acute distress.    Appearance: Normal appearance. She is well-developed and well-groomed. She is obese. She is not ill-appearing or toxic-appearing.  HENT:     Head: Normocephalic and atraumatic.     Nose:     Comments: Brooke King is in place, covering mouth and nose. Eyes:     General: Lids are normal. Vision grossly intact.        Right eye: No discharge.        Left eye: No discharge.     Extraocular Movements: Extraocular movements intact.     Conjunctiva/sclera: Conjunctivae normal.     Pupils: Pupils are equal, round, and reactive to light.  Cardiovascular:     Rate and Rhythm: Normal rate and regular rhythm.     Pulses: Normal pulses.     Heart sounds: Normal heart sounds. No murmur heard.  No friction rub. No gallop.   Pulmonary:     Effort: Pulmonary effort is normal. No respiratory distress.     Breath sounds: Wheezing present.  Musculoskeletal:     Right lower leg: No edema.     Left lower leg: No edema.  Skin:    General: Skin is warm  and dry.     Capillary Refill: Capillary refill takes less than 2 seconds.  Neurological:     General: No focal deficit present.     Mental Status: She is alert and oriented to person, place, and time.  Psychiatric:        Attention and Perception: Attention and perception normal.        Mood and Affect: Mood and affect normal.        Speech: Speech normal.        Behavior: Behavior normal. Behavior is cooperative.        Thought Content: Thought content normal.        Cognition and Memory: Cognition and memory normal.        Judgment: Judgment normal.    Results for orders placed or performed in visit on 04/28/18  Bladder Scan (Post Void Residual) in office  Result Value Ref Range   Scan Result 31ml       Assessment & Plan:   Problem List Items Addressed This Visit      Cardiovascular and Mediastinum    Benign essential hypertension    Stable and well controlled with valsartan 80 mg daily.  Not needing current refills.  Will plan to continue on this prescription once new refills are needed.      Relevant Medications   atorvastatin (LIPITOR) 10 MG tablet     Respiratory   OSA (obstructive sleep apnea)    Reports well controlled with CPAP.  Awakening well rested without morning fatigue.      COPD exacerbation (HCC)    Current COPD exacerbation.  Will treat with prednisone taper, azithromycin and proair albuterol inhaler.  Reports exacerbations 1-2x per year.  Denies COVID testing, has been without fever.  Plan: 1. Begin prednisone taper 2. Begin azithromycin according to directions 3. Begin proair albuterol inhaler as directed 4. RTC if symptoms worsen or fail to improve      Relevant Medications   predniSONE (DELTASONE) 20 MG tablet   albuterol (VENTOLIN HFA) 108 (90 Base) MCG/ACT inhaler   azithromycin (ZITHROMAX) 250 MG tablet   Fluticasone-Salmeterol (ADVAIR DISKUS) 250-50 MCG/DOSE AEPB   Chronic obstructive pulmonary disease (HCC)    See cetrilobular emphysema AP      Relevant Medications   predniSONE (DELTASONE) 20 MG tablet   albuterol (VENTOLIN HFA) 108 (90 Base) MCG/ACT inhaler   azithromycin (ZITHROMAX) 250 MG tablet   Fluticasone-Salmeterol (ADVAIR DISKUS) 250-50 MCG/DOSE AEPB   Centrilobular emphysema (HCC)    Treated with singulair 10mg  at bedtime with mild control of symptoms.  Will add advair 1 puff BID to her COPD regimen to help with COPD control.      Relevant Medications   predniSONE (DELTASONE) 20 MG tablet   albuterol (VENTOLIN HFA) 108 (90 Base) MCG/ACT inhaler   azithromycin (ZITHROMAX) 250 MG tablet   Fluticasone-Salmeterol (ADVAIR DISKUS) 250-50 MCG/DOSE AEPB     Endocrine   Type 2 diabetes mellitus with peripheral neuropathy (HCC)    A1C 6.8% on 05/2020 with [facility] primary care.  Patient reports has not been on medication for her diabetes in the  past.  Will start on metformin 500mg  BID WC and will plan to repeat A1C in 3 months.  Reviewed dietary, lifestyle modifications and foot care.  Will re-evaluate A1C at next visit and if needed, add glucometer and structured CBG      Relevant Medications   metFORMIN (GLUCOPHAGE) 500 MG tablet   atorvastatin (LIPITOR) 10 MG tablet  gabapentin (NEURONTIN) 300 MG capsule   Hypothyroidism    Labs from 05/2020 with [facility] show over-treated TSH with levothyroxine 114mcg.  Will decrease to 12mcg and repeat labs in 12 weeks.  Patient in agreement with tx plan.      Relevant Medications   levothyroxine (SYNTHROID) 125 MCG tablet     Other   Encounter to establish care with new doctor - Primary    New patient establishment at [facility] for primary care.  Previously followed with [facility] and reports was too far of a drive from her home for primary care.  Plan: 1. To have labs drawn in 3 months at follow up visit, last labs with [facility] 05/2020 and those labs reviewed with patient in clinic today.      Hyperlipidemia    Status unknown.  Recheck labs.  Continue meds without changes today.  Refills provided.       Relevant Medications   atorvastatin (LIPITOR) 10 MG tablet      Meds ordered this encounter  Medications  . metFORMIN (GLUCOPHAGE) 500 MG tablet    Sig: Take 1 tablet (500 mg total) by mouth 3 (three) times daily as needed.    Dispense:  270 tablet    Refill:  3  . levothyroxine (SYNTHROID) 125 MCG tablet    Sig: Take 1 tablet (125 mcg total) by mouth daily.    Dispense:  90 tablet    Refill:  3  . atorvastatin (LIPITOR) 10 MG tablet    Sig: Take 1 tablet (10 mg total) by mouth daily.    Dispense:  90 tablet    Refill:  3  . gabapentin (NEURONTIN) 300 MG capsule    Sig: Take 1 capsule (300 mg total) by mouth at bedtime.    Dispense:  30 capsule    Refill:  11  . predniSONE (DELTASONE) 20 MG tablet    Sig: Take 3 tablets (60 mg total) by mouth daily with  breakfast for 3 days, THEN 2 tablets (40 mg total) daily with breakfast for 3 days, THEN 1 tablet (20 mg total) daily with breakfast for 3 days.    Dispense:  18 tablet    Refill:  0  . albuterol (VENTOLIN HFA) 108 (90 Base) MCG/ACT inhaler    Sig: Inhale 1-2 puffs into the lungs every 6 (six) hours as needed for wheezing or shortness of breath.    Dispense:  8 g    Refill:  1  . azithromycin (ZITHROMAX) 250 MG tablet    Sig: Take 2 tablets on day 1, followed by 1 tablet daily x 4 days    Dispense:  6 tablet    Refill:  0  . Fluticasone-Salmeterol (ADVAIR DISKUS) 250-50 MCG/DOSE AEPB    Sig: Inhale 1 puff into the lungs 2 (two) times daily.    Dispense:  1 each    Refill:  3   Follow up plan: Return in about 3 months (around 11/20/2020) for T2DM, Hypothyroidism, and A1C F/U.   Harlin Rain, [place] [facility] Nurse Practitioner [facility] 08/22/2020, 2:57 PM

## 2020-08-22 NOTE — Assessment & Plan Note (Signed)
See cetrilobular emphysema AP

## 2020-08-22 NOTE — Assessment & Plan Note (Signed)
Stable and well controlled with valsartan 80 mg daily.  Not needing current refills.  Will plan to continue on this prescription once new refills are needed.

## 2020-08-22 NOTE — Patient Instructions (Signed)
As we discussed, I have sent in a prescription for Metformin to take 1 tablet, 2x per day with meals.  Be sure to take this with food, as it can be hard on the stomach.  I have reduced your levothyroxine from 118mcg to 168mcg daily.  Be sure to take this first thing in the morning, on an empty stomach, an hour before you eat/drink anything.  We will plan to repeat your A1C and Thyroid levels again in 3 months.  I have sent in a refill on your atorvastatin and gabapentin to your pharmacy on file.  I have sent in a prescription for advair to take 1 puff 2x per day EVERYDAY for your COPD to help keep your lungs healthy.  I have sent in a few prescriptions for your COPD exacerbation.   1. Begin prednisone taper, according to packaging directions 2. Begin azithromycin according to instructions 3. Can take ProAir albuterol inhaler 1-2 puffs every 4-6 hours as needed for cough, shortness of breath and/or wheezing  You can learn more information online about your diabetes at American Diabetes Association: http://www.diabetes.org/ - General self-care (diet, medications, blood sugar checks). - Diet recommendations - There are even recipes available for you to look at and try.  We will plan to see you back in 3 months for diabetes and hypothyroid follow up visit  You will receive a survey after today's visit either digitally by e-mail or paper by [place] mail. Your experiences and feedback matter to [place].  Please respond so we know how we are doing as we provide care for you.  Call us with any questions/concerns/needs.  It is my goal to be available to you for your health concerns.  Thanks for choosing me to be a partner in your healthcare needs!  Harlin Rain, FNP-C Family Nurse Practitioner [facility] Phone: [phone]

## 2020-08-22 NOTE — Assessment & Plan Note (Signed)
A1C 6.8% on 05/2020 with [facility] primary care.  Patient reports has not been on medication for her diabetes in the past.  Will start on metformin 500mg  BID WC and will plan to repeat A1C in 3 months.  Reviewed dietary, lifestyle modifications and foot care.  Will re-evaluate A1C at next visit and if needed, add glucometer and structured CBG

## 2020-08-22 NOTE — Assessment & Plan Note (Signed)
Current COPD exacerbation.  Will treat with prednisone taper, azithromycin and proair albuterol inhaler.  Reports exacerbations 1-2x per year.  Denies COVID testing, has been without fever.  Plan: 1. Begin prednisone taper 2. Begin azithromycin according to directions 3. Begin proair albuterol inhaler as directed 4. RTC if symptoms worsen or fail to improve

## 2020-08-22 NOTE — Assessment & Plan Note (Signed)
Status unknown.  Recheck labs.  Continue meds without changes today.  Refills provided.

## 2020-08-22 NOTE — Assessment & Plan Note (Signed)
Labs from 05/2020 with [facility] show over-treated TSH with levothyroxine 167mcg.  Will decrease to 131mcg and repeat labs in 12 weeks.  Patient in agreement with tx plan.

## 2020-08-22 NOTE — Assessment & Plan Note (Signed)
Reports well controlled with CPAP.  Awakening well rested without morning fatigue.

## 2020-08-22 NOTE — Assessment & Plan Note (Signed)
Treated with singulair 10mg  at bedtime with mild control of symptoms.  Will add advair 1 puff BID to her COPD regimen to help with COPD control.

## 2020-09-11 DIAGNOSIS — G4733 Obstructive sleep apnea (adult) (pediatric): Secondary | ICD-10-CM | POA: Diagnosis not present

## 2020-10-21 ENCOUNTER — Telehealth: Payer: Self-pay

## 2020-10-21 NOTE — Telephone Encounter (Signed)
Copied from [place] [phone]. Topic: General - Other >> Oct 21, 2020  1:10 PM Lenon Curt, [place] A wrote: Reason for CRM: Patient made call in with concerns about diabetic medications and monitoring  Patient would like to be contacted to discuss coming in to the office as well as further clarity about diabetic concerns (type, sugar and testing)  Agent was unable to schedule an agreeable office visit at the time of phone call.  Please contact to advise and schedule.

## 2020-10-21 NOTE — Telephone Encounter (Signed)
Appt scheduled for the patient on next Tuesday, feb. 8th.

## 2020-10-29 ENCOUNTER — Other Ambulatory Visit: Payer: Self-pay

## 2020-10-29 ENCOUNTER — Ambulatory Visit (INDEPENDENT_AMBULATORY_CARE_PROVIDER_SITE_OTHER): Payer: Medicare Other | Admitting: Unknown Physician Specialty

## 2020-10-29 VITALS — BP 122/56 | HR 76 | Ht 63.0 in | Wt 178.8 lb

## 2020-10-29 DIAGNOSIS — J449 Chronic obstructive pulmonary disease, unspecified: Secondary | ICD-10-CM

## 2020-10-29 DIAGNOSIS — E1142 Type 2 diabetes mellitus with diabetic polyneuropathy: Secondary | ICD-10-CM

## 2020-10-29 DIAGNOSIS — R5383 Other fatigue: Secondary | ICD-10-CM | POA: Diagnosis not present

## 2020-10-29 DIAGNOSIS — E039 Hypothyroidism, unspecified: Secondary | ICD-10-CM | POA: Diagnosis not present

## 2020-10-29 LAB — POCT GLYCOSYLATED HEMOGLOBIN (HGB A1C): Hemoglobin A1C: 6.2 % — AB (ref 4.0–5.6)

## 2020-10-29 LAB — CBC WITH DIFFERENTIAL/PLATELET: WBC: 8.1 10*3/uL (ref 3.8–10.8)

## 2020-10-29 MED ORDER — ALBUTEROL SULFATE HFA 108 (90 BASE) MCG/ACT IN AERS: 1.0000 | INHALATION_SPRAY | Freq: Four times a day (QID) | RESPIRATORY_TRACT | 1 refills | Status: DC | PRN

## 2020-10-29 MED ORDER — SPIRIVA HANDIHALER 18 MCG IN CAPS: 18.0000 ug | ORAL_CAPSULE | Freq: Every day | RESPIRATORY_TRACT | 12 refills | Status: DC

## 2020-10-29 NOTE — Assessment & Plan Note (Signed)
Decrease of dose 2 month ago.  Recheck today

## 2020-10-29 NOTE — Assessment & Plan Note (Addendum)
Hgb AiC 6.3 without taking Metformin.  Discussed current evidence does not recommend continuing home glucose monitoring.  She is releived.  Refusing lifestyle center but is staying active and watching diet

## 2020-10-29 NOTE — Progress Notes (Signed)
   BP (!) 122/56   Pulse 76   Ht 5\' 3"  (1.6 m)   Wt 178 lb 12.8 oz (81.1 kg)   SpO2 99%   BMI 31.67 kg/m    Subjective:    Patient ID: Brooke King, female    DOB: 06/26/1941, 80 y.o.   MRN: [id number]  HPI: Brooke King is a 80 y.o. female  Chief Complaint  Patient presents with  . Follow-up    Clarity about Diabetes    Relevant past medical, surgical, family and social history reviewed and updated as indicated. Interim medical history since our last visit reviewed. Allergies and medications reviewed and updated.  Review of Systems  Per HPI unless specifically indicated above     Objective:    BP (!) 122/56   Pulse 76   Ht 5\' 3"  (1.6 m)   Wt 178 lb 12.8 oz (81.1 kg)   SpO2 99%   BMI 31.67 kg/m   Wt Readings from Last 3 Encounters:  10/29/20 178 lb 12.8 oz (81.1 kg)  08/22/20 179 lb 9.6 oz (81.5 kg)  04/28/18 181 lb (82.1 kg)    Physical Exam  Results for orders placed or performed in visit on 10/29/20  POCT glycosylated hemoglobin (Hb A1C)  Result Value Ref Range   Hemoglobin A1C 6.2 (A) 4.0 - 5.6 %   HbA1c POC (<> result, manual entry)     HbA1c, POC (prediabetic range)     HbA1c, POC (controlled diabetic range)        Assessment & Plan:   Problem List Items Addressed This Visit      Unprioritized   Type 2 diabetes mellitus with peripheral neuropathy ([place])   Relevant Orders   POCT glycosylated hemoglobin (Hb A1C) (Completed)       Follow up plan: No follow-ups on file.

## 2020-10-29 NOTE — Progress Notes (Signed)
BP (!) 122/56   Pulse 76   Ht 5\' 3"  (1.6 m)   Wt 178 lb 12.8 oz (81.1 kg)   SpO2 99%   BMI 31.67 kg/m    Subjective:    Patient ID: Brooke King, female    DOB: May 12, 1941, 80 y.o.   MRN: [id number]  HPI: Brooke King is a 80 y.o. female  Chief Complaint  Patient presents with  . Follow-up    Clarity about Diabetes   Diabetes Review of notes showed diagnosed with diabetes at [facility] with Hgb A1C of 6.8 on 05/2020.  Last month, started on Metformin and added glucose monitoring in this clinic.  She was "afraid" of Metformin and didn't take it.  Hgb A1C today is 6.2% She is asking about glucose monitoring she has not yet initiated.    SOB Asking for inhaler refill.  Uses inhaler at night.  Sometimes wheezes during the day about twice a week and takes a nebulizer. Given Advair which she took once and it "burnt me" and didn't take anymore.    Fatigue Hx of hypothyroid.  Decreased thyroid dose last visit.  More fatigue lately with upper back and neck pain.  Taking Meloxicam   Relevant past medical, surgical, family and social history reviewed and updated as indicated. Interim medical history since our last visit reviewed. Allergies and medications reviewed and updated.  Review of Systems  Per HPI unless specifically indicated above     Objective:    BP (!) 122/56   Pulse 76   Ht 5\' 3"  (1.6 m)   Wt 178 lb 12.8 oz (81.1 kg)   SpO2 99%   BMI 31.67 kg/m   Wt Readings from Last 3 Encounters:  10/29/20 178 lb 12.8 oz (81.1 kg)  08/22/20 179 lb 9.6 oz (81.5 kg)  04/28/18 181 lb (82.1 kg)    Physical Exam Constitutional:      General: She is not in acute distress.    Appearance: Normal appearance. She is well-developed and well-nourished.  HENT:     Head: Normocephalic and atraumatic.  Eyes:     General: Lids are normal. No scleral icterus.       Right eye: No discharge.        Left eye: No discharge.     Conjunctiva/sclera: Conjunctivae normal.  Neck:     Vascular: No  carotid bruit or JVD.  Cardiovascular:     Rate and Rhythm: Normal rate and regular rhythm.     Heart sounds: Normal heart sounds.  Pulmonary:     Effort: Pulmonary effort is normal.     Breath sounds: Normal breath sounds.  Abdominal:     Palpations: There is no hepatomegaly or splenomegaly.  Musculoskeletal:        General: Normal range of motion.     Cervical back: Normal range of motion and neck supple.  Skin:    General: Skin is warm, dry and intact.     Coloration: Skin is not pale.     Findings: No rash.  Neurological:     Mental Status: She is alert and oriented to person, place, and time.  Psychiatric:        Mood and Affect: Mood and affect normal.        Behavior: Behavior normal.        Thought Content: Thought content normal.        Judgment: Judgment normal.       Assessment & Plan:  Problem List Items Addressed This Visit      Unprioritized   COPD exacerbation ([place])   Relevant Medications   tiotropium (SPIRIVA HANDIHALER) 18 MCG inhalation capsule   albuterol (VENTOLIN HFA) 108 (90 Base) MCG/ACT inhaler   Hypothyroidism - Primary    Decrease of dose 2 month ago.  Recheck today      Relevant Orders   TSH   T4   Type 2 diabetes mellitus with peripheral neuropathy (HCC)    Hgb AiC 6.3 without taking Metformin.  Discussed current evidence does not recommend continuing home glucose monitoring.  She is releived.  Refusing lifestyle center but is staying active and watching diet      Relevant Orders   POCT glycosylated hemoglobin (Hb A1C) (Completed)   Comprehensive metabolic panel    Other Visit Diagnoses    Fatigue, unspecified type       Check TSH today with recent fatigue and recent dose change   Relevant Orders   CBC       Follow up plan: Return in about 3 months (around 01/26/2021).

## 2020-10-29 NOTE — Assessment & Plan Note (Signed)
Intolerant to Advair.  LAMA recommended per B- symptoms, Gold Guidelines.  Start [place] and [place].  Refilled Albuterol.

## 2020-10-30 ENCOUNTER — Other Ambulatory Visit: Payer: Self-pay

## 2020-10-30 LAB — CBC WITH DIFFERENTIAL/PLATELET
Absolute Monocytes: 559 cells/uL (ref 200–950)
Basophils Absolute: 89 cells/uL (ref 0–200)
Basophils Relative: 1.1 %
Eosinophils Absolute: 397 cells/uL (ref 15–500)
Eosinophils Relative: 4.9 %
HCT: 40.6 % (ref 35.0–45.0)
Hemoglobin: 13.9 g/dL (ref 11.7–15.5)
Lymphs Abs: 1976 cells/uL (ref 850–3900)
MCH: 31.7 pg (ref 27.0–33.0)
MCHC: 34.2 g/dL (ref 32.0–36.0)
MCV: 92.5 fL (ref 80.0–100.0)
MPV: 14 fL — ABNORMAL HIGH (ref 7.5–12.5)
Monocytes Relative: 6.9 %
Neutro Abs: 5079 cells/uL (ref 1500–7800)
Neutrophils Relative %: 62.7 %
Platelets: 131 10*3/uL — ABNORMAL LOW (ref 140–400)
RBC: 4.39 10*6/uL (ref 3.80–5.10)
RDW: 12.3 % (ref 11.0–15.0)
Total Lymphocyte: 24.4 %

## 2020-10-30 LAB — COMPREHENSIVE METABOLIC PANEL
AG Ratio: 1.6 (calc) (ref 1.0–2.5)
ALT: 16 U/L (ref 6–29)
AST: 19 U/L (ref 10–35)
Albumin: 4.3 g/dL (ref 3.6–5.1)
Alkaline phosphatase (APISO): 75 U/L (ref 37–153)
BUN: 13 mg/dL (ref 7–25)
CO2: 27 mmol/L (ref 20–32)
Calcium: 9.8 mg/dL (ref 8.6–10.4)
Chloride: 107 mmol/L (ref 98–110)
Creat: 0.79 mg/dL (ref 0.60–0.93)
Globulin: 2.7 g/dL (calc) (ref 1.9–3.7)
Glucose, Bld: 124 mg/dL — ABNORMAL HIGH (ref 65–99)
Potassium: 4.4 mmol/L (ref 3.5–5.3)
Sodium: 142 mmol/L (ref 135–146)
Total Bilirubin: 0.4 mg/dL (ref 0.2–1.2)
Total Protein: 7 g/dL (ref 6.1–8.1)

## 2020-10-30 LAB — T4: T4, Total: 11 ug/dL (ref 5.1–11.9)

## 2020-10-30 LAB — TSH: TSH: 0.06 mIU/L — ABNORMAL LOW (ref 0.40–4.50)

## 2020-11-25 ENCOUNTER — Ambulatory Visit: Payer: Medicare Other | Admitting: Family Medicine

## 2020-11-25 ENCOUNTER — Encounter: Payer: Self-pay | Admitting: Family Medicine

## 2020-11-25 ENCOUNTER — Other Ambulatory Visit: Payer: Self-pay

## 2020-11-25 ENCOUNTER — Ambulatory Visit (INDEPENDENT_AMBULATORY_CARE_PROVIDER_SITE_OTHER): Payer: Medicare Other | Admitting: Family Medicine

## 2020-11-25 VITALS — BP 120/64 | HR 75 | Temp 97.3°F | Ht 63.0 in | Wt 180.2 lb

## 2020-11-25 DIAGNOSIS — E039 Hypothyroidism, unspecified: Secondary | ICD-10-CM

## 2020-11-25 DIAGNOSIS — E1142 Type 2 diabetes mellitus with diabetic polyneuropathy: Secondary | ICD-10-CM

## 2020-11-25 DIAGNOSIS — M25511 Pain in right shoulder: Secondary | ICD-10-CM | POA: Diagnosis not present

## 2020-11-25 DIAGNOSIS — M25512 Pain in left shoulder: Secondary | ICD-10-CM

## 2020-11-25 DIAGNOSIS — G8929 Other chronic pain: Secondary | ICD-10-CM

## 2020-11-25 MED ORDER — GABAPENTIN 300 MG PO CAPS: 300.0000 mg | ORAL_CAPSULE | Freq: Three times a day (TID) | ORAL | 1 refills | Status: DC

## 2020-11-25 NOTE — Progress Notes (Signed)
Subjective:    Patient ID: Brooke King, female    DOB: 1941/07/31, 79 y.o.   MRN: [id number]  Brooke King is a 80 y.o. female presenting on 11/25/2020 for Hypothyroidism (Pt thinks her Levothyroxine medication makes her feel agitated.) and Diabetes   HPI   Previous PCP Cyndia Skeeters, FNP  Hypothyroidism Background history, she had thyroid cancer in 1967, she has been on thyroid replacement since that time, and goal to keep her thyroid in a lower level overall. Previous PCPs have managed this before. - She was taking Synthroid in past and then recent order on 08/2020 with change from 150 to 15mcg to generic levothyroxine Last lab 10/29/20 showed low TSH at 0.06, due for repeat labs now  DM2 Neuropathy Using topical aspercreme PRN Admits burning pain neuropathy in feet. She is taking Gabapentin 300mg  now up to TID but will run out early rx not sent for 3 time daily dosing   Past Surgical History:  Procedure Laterality Date  . ABDOMINAL HYSTERECTOMY    . CATARACT EXTRACTION, BILATERAL    . CERVICAL LAMINECTOMY  08/2004  . CHOLECYSTECTOMY    . COLONOSCOPY WITH PROPOFOL N/A 11/18/2015   Procedure: COLONOSCOPY WITH PROPOFOL;  Surgeon: Hulen Luster, MD;  Location: [facility] ENDOSCOPY;  Service: Gastroenterology;  Laterality: N/A;  . ESOPHAGOGASTRODUODENOSCOPY (EGD) WITH PROPOFOL N/A 11/18/2015   Procedure: ESOPHAGOGASTRODUODENOSCOPY (EGD) WITH PROPOFOL;  Surgeon: Hulen Luster, MD;  Location: [facility] ENDOSCOPY;  Service: Gastroenterology;  Laterality: N/A;  . HEMORRHOID SURGERY    . TUBAL LIGATION      Depression screen [facility] 2/9 11/25/2020  Decreased Interest 0  Down, Depressed, Hopeless 0  PHQ - 2 Score 0    Social History   Tobacco Use  . Smoking status: Current Every Day Smoker    Packs/day: 1.00    Years: 40.00    Pack years: 40.00  . Smokeless tobacco: Never Used  Substance Use Topics  . Alcohol use: No  . Drug use: No    Review of Systems Per HPI unless specifically indicated  above     Objective:    BP 120/64 (BP Location: Left Arm, Patient Position: Sitting, Cuff Size: Normal)   Pulse 75   Temp (!) 97.3 F (36.3 C) (Temporal)   Ht 5\' 3"  (1.6 m)   Wt 180 lb 3.2 oz (81.7 kg)   SpO2 98%   BMI 31.92 kg/m   Wt Readings from Last 3 Encounters:  11/25/20 180 lb 3.2 oz (81.7 kg)  10/29/20 178 lb 12.8 oz (81.1 kg)  08/22/20 179 lb 9.6 oz (81.5 kg)    Physical Exam Vitals and nursing note reviewed.  Constitutional:      General: She is not in acute distress.    Appearance: She is well-developed and well-nourished. She is not diaphoretic.     Comments: Well-appearing, comfortable, cooperative  HENT:     Head: Normocephalic and atraumatic.     Mouth/Throat:     Mouth: Oropharynx is clear and moist.  Eyes:     General:        Right eye: No discharge.        Left eye: No discharge.     Conjunctiva/sclera: Conjunctivae normal.  Cardiovascular:     Rate and Rhythm: Normal rate.  Pulmonary:     Effort: Pulmonary effort is normal.  Musculoskeletal:        General: No edema.     Right lower leg: No edema.  Left lower leg: No edema.  Skin:    General: Skin is warm and dry.     Findings: No erythema or rash.  Neurological:     Mental Status: She is alert and oriented to person, place, and time.  Psychiatric:        Mood and Affect: Mood and affect normal.        Behavior: Behavior normal.     Comments: Well groomed, good eye contact, normal speech and thoughts    Results for orders placed or performed in visit on 10/29/20  TSH  Result Value Ref Range   TSH 0.06 (L) 0.40 - 4.50 mIU/L  Comprehensive metabolic panel  Result Value Ref Range   Glucose, Bld 124 (H) 65 - 99 mg/dL   BUN 13 7 - 25 mg/dL   Creat 0.79 0.60 - 0.93 mg/dL   BUN/Creatinine Ratio NOT APPLICABLE 6 - 22 (calc)   Sodium 142 135 - 146 mmol/L   Potassium 4.4 3.5 - 5.3 mmol/L   Chloride 107 98 - 110 mmol/L   CO2 27 20 - 32 mmol/L   Calcium 9.8 8.6 - 10.4 mg/dL   Total Protein  7.0 6.1 - 8.1 g/dL   Albumin 4.3 3.6 - 5.1 g/dL   Globulin 2.7 1.9 - 3.7 g/dL (calc)   AG Ratio 1.6 1.0 - 2.5 (calc)   Total Bilirubin 0.4 0.2 - 1.2 mg/dL   Alkaline phosphatase (APISO) 75 37 - 153 U/L   AST 19 10 - 35 U/L   ALT 16 6 - 29 U/L  T4  Result Value Ref Range   T4, Total 11.0 5.1 - 11.9 mcg/dL  CBC with Differential/Platelet  Result Value Ref Range   WBC 8.1 3.8 - 10.8 Thousand/uL   RBC 4.39 3.80 - 5.10 Million/uL   Hemoglobin 13.9 11.7 - 15.5 g/dL   HCT 40.6 35.0 - 45.0 %   MCV 92.5 80.0 - 100.0 fL   MCH 31.7 27.0 - 33.0 pg   MCHC 34.2 32.0 - 36.0 g/dL   RDW 12.3 11.0 - 15.0 %   Platelets 131 (L) 140 - 400 Thousand/uL   MPV 14.0 (H) 7.5 - 12.5 fL   Neutro Abs 5,079 1,500 - 7,800 cells/uL   Lymphs Abs 1,976 850 - 3,900 cells/uL   Absolute Monocytes 559 200 - 950 cells/uL   Eosinophils Absolute 397 15 - 500 cells/uL   Basophils Absolute 89 0 - 200 cells/uL   Neutrophils Relative % 62.7 %   Total Lymphocyte 24.4 %   Monocytes Relative 6.9 %   Eosinophils Relative 4.9 %   Basophils Relative 1.1 %  POCT glycosylated hemoglobin (Hb A1C)  Result Value Ref Range   Hemoglobin A1C 6.2 (A) 4.0 - 5.6 %   HbA1c POC (<> result, manual entry)     HbA1c, POC (prediabetic range)     HbA1c, POC (controlled diabetic range)        Assessment & Plan:   Problem List Items Addressed This Visit    Type 2 diabetes mellitus with peripheral neuropathy (HCC) - Primary   Relevant Medications   gabapentin (NEURONTIN) 300 MG capsule   Other Relevant Orders   POCT HgB A1C   Hypothyroidism   Relevant Orders   T4, free   TSH    Other Visit Diagnoses    Chronic pain of both shoulders       Relevant Medications   gabapentin (NEURONTIN) 300 MG capsule     Hypothyroidism S/p thyroidectomy from  cancer 1967 Chronic replacement of thyroid Recently has had abnormal TSH result, 10/29/20 result TSH 0.06 On 08/2020 Her dosage was reduced from 150 to 15mcg - on generic  levothyroxine Will re-check TSH + Free T4 today, and adjust dosing based on this result, I will plan to change her from generic levothyroxine over to brand name Synthroid if covered by insurance.  #Type 2 Diabetes with Neuropathy A1 6.2 back on 10/29/20 DM Foot exam today Re order Gabapentin, higher dose that matches what she is currently taking Gabapentin 300mg  TID   Meds ordered this encounter  Medications  . gabapentin (NEURONTIN) 300 MG capsule    Sig: Take 1 capsule (300 mg total) by mouth 3 (three) times daily.    Dispense:  270 capsule    Refill:  1      Follow up plan: Return in about 3 months (around 02/25/2021) for 3 month follow-up AM apt w new provider (or possibly can stay with [facility]) Thyroid labs AFTER visit.   Nobie Putnam, [facility] 11/25/2020, 10:11 AM

## 2020-11-25 NOTE — Patient Instructions (Addendum)
Thank you for coming to the office today.  For the nose, possibly dried out too much.  Try simply saline nasal spray OTC - as much as you need, to keep nasal tissue moist Can also use topical neosporin or vaseline to keep lubricated.  Increased Gabapentin from to 300mg  3 times a day new rx sent to [facility].  Re-check thyroid blood work today, stay tuned for a dose adjustment.   Please schedule a Follow-up Appointment to: Return in about 3 months (around 02/25/2021) for 3 month follow-up AM apt w new provider (or possibly can stay with [facility]) Thyroid labs AFTER visit.  If you have any other questions or concerns, please feel free to call the office or send a message through [facility]. You may also schedule an earlier appointment if necessary.  Additionally, you may be receiving a survey about your experience at our office within a few days to 1 week by e-mail or mail. We value your feedback.  Brooke Putnam, DO [place]

## 2020-11-26 ENCOUNTER — Other Ambulatory Visit: Payer: Self-pay | Admitting: Family Medicine

## 2020-11-26 DIAGNOSIS — E039 Hypothyroidism, unspecified: Secondary | ICD-10-CM

## 2020-11-26 LAB — T4, FREE: Free T4: 1.4 ng/dL (ref 0.8–1.8)

## 2020-11-26 LAB — TSH: TSH: 0.08 mIU/L — ABNORMAL LOW (ref 0.40–4.50)

## 2020-11-26 MED ORDER — SYNTHROID 112 MCG PO TABS: 112.0000 ug | ORAL_TABLET | Freq: Every day | ORAL | 1 refills | Status: DC

## 2021-02-18 ENCOUNTER — Telehealth: Payer: Self-pay

## 2021-02-18 NOTE — Telephone Encounter (Signed)
Copied from [place] [id number]. Topic: General - Inquiry >> Feb 18, 2021 10:58 AM Greggory Keen D wrote: Pt called saying she has some sinus stuff going on and drainage in there throat,  no fever.Marland Kitchen She wants to know if there is something she can take.  CB#  [phone]

## 2021-02-18 NOTE — Telephone Encounter (Signed)
We can add her on at 11:20

## 2021-02-18 NOTE — Telephone Encounter (Signed)
Appt scheduled for tomorrow at 11:20am

## 2021-02-19 ENCOUNTER — Encounter: Payer: Self-pay | Admitting: Internal Medicine

## 2021-02-19 ENCOUNTER — Other Ambulatory Visit: Payer: Self-pay

## 2021-02-19 ENCOUNTER — Telehealth (INDEPENDENT_AMBULATORY_CARE_PROVIDER_SITE_OTHER): Payer: Medicare Other | Admitting: Internal Medicine

## 2021-02-19 VITALS — BP 112/76

## 2021-02-19 DIAGNOSIS — J329 Chronic sinusitis, unspecified: Secondary | ICD-10-CM | POA: Diagnosis not present

## 2021-02-19 DIAGNOSIS — J410 Simple chronic bronchitis: Secondary | ICD-10-CM | POA: Diagnosis not present

## 2021-02-19 DIAGNOSIS — B9789 Other viral agents as the cause of diseases classified elsewhere: Secondary | ICD-10-CM | POA: Diagnosis not present

## 2021-02-19 MED ORDER — PREDNISONE 10 MG PO TABS: ORAL_TABLET | ORAL | 0 refills | Status: DC

## 2021-02-19 NOTE — Progress Notes (Signed)
Virtual Visit via Telephone Note  I connected with Brooke King on 02/19/21 at 11:20 AM EDT by telephone and verified that I am speaking with the correct person using two identifiers.  Location: Patient: Home Provider: Office   I discussed the limitations, risks, security and privacy concerns of performing an evaluation and management service by telephone and the availability of in person appointments. I also discussed with the patient that there may be a patient responsible charge related to this service. The patient expressed understanding and agreed to proceed.   History of Present Illness:  Pt reports headache, facial pain and pressure, runny nose and cough. This started 1 week ago. The headache is located in her forehead and around her eyes. She describes the pain as pressure. She denies dizziness or visual changes. She is blowing clear mucous out of her nose. She denies difficulty swallowing. The cough is productive of clear mucous. She denies fever, chills or body aches. She has tried Flonase, Zyrtec, Singulair, Spiriva and Albuterol as prescribed. She has not had exposure to covid that she is aware of.    Past Medical History:  Diagnosis Date  . Allergy   . B12 deficiency   . Chest pain syndrome   . COPD (chronic obstructive pulmonary disease) ([place])   . Depression   . GERD (gastroesophageal reflux disease)   . Glaucoma   . History of colonic polyps   . History of degenerative disc disease    c-spine, status post cervical laminectomy  . Hyperlipidemia   . Hypertension   . Hypothyroidism   . Insomnia   . Migraine   . Restless leg syndrome   . Seasonal allergies   . Sleep apnea   . Thyroid cancer ([place])   . Urinary incontinence     Current Outpatient Medications  Medication Sig Dispense Refill  . SYNTHROID 112 MCG tablet Take 1 tablet (112 mcg total) by mouth daily before breakfast. 90 tablet 1  . albuterol (VENTOLIN HFA) 108 (90 Base) MCG/ACT inhaler Inhale 1-2 puffs  into the lungs every 6 (six) hours as needed for wheezing or shortness of breath. 8 g 1  . ALPRAZolam (XANAX) 0.5 MG tablet Take 0.5 mg by mouth at bedtime as needed for anxiety. (Patient not taking: No sig reported)    . aspirin EC 81 MG tablet Take 81 mg by mouth daily.    Marland Kitchen atorvastatin (LIPITOR) 10 MG tablet Take 1 tablet (10 mg total) by mouth daily. 90 tablet 3  . Calcium Carb-Cholecalciferol (CALCIUM + D3) 600-200 MG-UNIT TABS Take by mouth.    . cetirizine (ZYRTEC) 10 MG tablet Take 10 mg by mouth daily.    . Cranberry 1000 MG CAPS Take by mouth.    . fluticasone (FLONASE) 50 MCG/ACT nasal spray Place into both nostrils daily.    Marland Kitchen gabapentin (NEURONTIN) 300 MG capsule Take 1 capsule (300 mg total) by mouth 3 (three) times daily. 270 capsule 1  . latanoprost (XALATAN) 0.005 % ophthalmic solution 1 drop at bedtime.     . meloxicam (MOBIC) 7.5 MG tablet Take 1 tablet by mouth daily.    . montelukast (SINGULAIR) 10 MG tablet Take 10 mg by mouth at bedtime.    . Multiple Vitamin (MULTIVITAMIN) tablet Take 1 tablet by mouth daily.    Marland Kitchen omeprazole (PRILOSEC) 20 MG capsule Take 1 capsule by mouth 2 (two) times daily.    Marland Kitchen tiotropium (SPIRIVA HANDIHALER) 18 MCG inhalation capsule Place 1 capsule (18 mcg total) into  inhaler and inhale daily. 30 capsule 12  . valsartan (DIOVAN) 80 MG tablet Take 80 mg by mouth daily.    . vitamin B-12 (CYANOCOBALAMIN) 1000 MCG tablet Take 1,000 mcg by mouth daily.     No current facility-administered medications for this visit.    Allergies  Allergen Reactions  . Toviaz [Fesoterodine Fumarate Er] Other (See Comments)    Throat closed  . Cortisone   . Doxycycline Hyclate   . Hydrocodone   . Keflex [Cephalexin]   . Septra [Sulfamethoxazole-Trimethoprim]   . Tagamet [Cimetidine]   . Zoloft [Sertraline]   . Penicillins Rash    Family History  Problem Relation Age of Onset  . Heart disease Mother   . Breast cancer Neg Hx     Social History    Socioeconomic History  . Marital status: Widowed    Spouse name: Not on file  . Number of children: Not on file  . Years of education: Not on file  . Highest education level: Not on file  Occupational History  . Occupation: Retired  Tobacco Use  . Smoking status: Current Every Day Smoker    Packs/day: 1.00    Years: 40.00    Pack years: 40.00  . Smokeless tobacco: Never Used  Substance and Sexual Activity  . Alcohol use: No  . Drug use: No  . Sexual activity: Not on file  Other Topics Concern  . Not on file  Social History Narrative  . Not on file   Social Determinants of Health   Financial Resource Strain: Not on file  Food Insecurity: Not on file  Transportation Needs: Not on file  Physical Activity: Not on file  Stress: Not on file  Social Connections: Not on file  Intimate Partner Violence: Not on file     Constitutional: Pt reports headache. Denies fever, malaise, fatigue, or abrupt weight changes.  HEENT: Pt reports facial pain and pressure, runny nose and sore throat. Denies eye pain, eye redness, ear pain, ringing in the ears, wax buildup, nasal congestion, bloody nose. Respiratory: Pt reports cough. Denies difficulty breathing, shortness of breath, cough or sputum production.   Cardiovascular: Denies chest pain, chest tightness, palpitations or swelling in the hands or feet.  Neurological: Denies dizziness, difficulty with memory, difficulty with speech or problems with balance and coordination.    No other specific complaints in a complete review of systems (except as listed in HPI above).    Observations/Objective:  BP 112/76   Wt Readings from Last 3 Encounters:  11/25/20 180 lb 3.2 oz (81.7 kg)  10/29/20 178 lb 12.8 oz (81.1 kg)  08/22/20 179 lb 9.6 oz (81.5 kg)    General: In NAD. Neurological: Alert and oriented.   BMET    Component Value Date/Time   NA 142 10/29/2020 1125   K 4.4 10/29/2020 1125   K 3.8 12/15/2013 1202   CL 107  10/29/2020 1125   CO2 27 10/29/2020 1125   GLUCOSE 124 (H) 10/29/2020 1125   BUN 13 10/29/2020 1125   CREATININE 0.79 10/29/2020 1125   CALCIUM 9.8 10/29/2020 1125   GFRNONAA >60 01/19/2012 0949   GFRAA >60 01/19/2012 0949    Lipid Panel  No results found for: CHOL, TRIG, HDL, CHOLHDL, VLDL, LDLCALC  CBC    Component Value Date/Time   WBC 8.1 10/29/2020 1125   RBC 4.39 10/29/2020 1125   HGB 13.9 10/29/2020 1125   HCT 40.6 10/29/2020 1125   PLT 131 (L) 10/29/2020 1125  MCV 92.5 10/29/2020 1125   MCH 31.7 10/29/2020 1125   MCHC 34.2 10/29/2020 1125   RDW 12.3 10/29/2020 1125   LYMPHSABS 1,976 10/29/2020 1125   EOSABS 397 10/29/2020 1125   BASOSABS 89 10/29/2020 1125    Hgb A1C Lab Results  Component Value Date   HGBA1C 6.2 (A) 10/29/2020       Assessment and Plan:  Viral Sinusitis:  Continue prescribed medications Stop Flonase RX for Pred Taper x 6 days No indication for abx at this time If worsens, would consider home Covid testing Encouraged rest and fluids Encouraged Tylenol as needed for headache Can take Robitussion or Delsym for cough  Return precautions discussed  Follow Up Instructions:    I discussed the assessment and treatment plan with the patient. The patient was provided an opportunity to ask questions and all were answered. The patient agreed with the plan and demonstrated an understanding of the instructions.   The patient was advised to call back or seek an in-person evaluation if the symptoms worsen or if the condition fails to improve as anticipated.  I provided 7:10 minutes of non-face-to-face time during this encounter.   Webb Silversmith, NP

## 2021-02-19 NOTE — Patient Instructions (Signed)

## 2021-02-25 ENCOUNTER — Ambulatory Visit: Payer: Medicare Other | Admitting: Unknown Physician Specialty

## 2021-02-25 ENCOUNTER — Ambulatory Visit (INDEPENDENT_AMBULATORY_CARE_PROVIDER_SITE_OTHER): Payer: Medicare Other | Admitting: Internal Medicine

## 2021-02-25 ENCOUNTER — Encounter: Payer: Self-pay | Admitting: Internal Medicine

## 2021-02-25 ENCOUNTER — Other Ambulatory Visit: Payer: Self-pay

## 2021-02-25 VITALS — BP 133/63 | HR 69 | Temp 97.5°F | Resp 18 | Ht 63.0 in | Wt 178.0 lb

## 2021-02-25 DIAGNOSIS — E890 Postprocedural hypothyroidism: Secondary | ICD-10-CM

## 2021-02-25 DIAGNOSIS — J411 Mucopurulent chronic bronchitis: Secondary | ICD-10-CM | POA: Diagnosis not present

## 2021-02-25 DIAGNOSIS — Z6831 Body mass index (BMI) 31.0-31.9, adult: Secondary | ICD-10-CM

## 2021-02-25 DIAGNOSIS — F5101 Primary insomnia: Secondary | ICD-10-CM | POA: Insufficient documentation

## 2021-02-25 DIAGNOSIS — G4733 Obstructive sleep apnea (adult) (pediatric): Secondary | ICD-10-CM

## 2021-02-25 DIAGNOSIS — F419 Anxiety disorder, unspecified: Secondary | ICD-10-CM | POA: Diagnosis not present

## 2021-02-25 DIAGNOSIS — K219 Gastro-esophageal reflux disease without esophagitis: Secondary | ICD-10-CM

## 2021-02-25 DIAGNOSIS — E782 Mixed hyperlipidemia: Secondary | ICD-10-CM

## 2021-02-25 DIAGNOSIS — N393 Stress incontinence (female) (male): Secondary | ICD-10-CM

## 2021-02-25 DIAGNOSIS — E1142 Type 2 diabetes mellitus with diabetic polyneuropathy: Secondary | ICD-10-CM

## 2021-02-25 DIAGNOSIS — I10 Essential (primary) hypertension: Secondary | ICD-10-CM | POA: Diagnosis not present

## 2021-02-25 DIAGNOSIS — Z85850 Personal history of malignant neoplasm of thyroid: Secondary | ICD-10-CM

## 2021-02-25 DIAGNOSIS — E6609 Other obesity due to excess calories: Secondary | ICD-10-CM

## 2021-02-25 MED ORDER — TRAZODONE HCL 50 MG PO TABS: 25.0000 mg | ORAL_TABLET | Freq: Every evening | ORAL | 0 refills | Status: DC | PRN

## 2021-02-25 NOTE — Assessment & Plan Note (Signed)
In remission status post thyroidectomy

## 2021-02-25 NOTE — Assessment & Plan Note (Signed)
Lipid profile reviewed Encouraged her to consume a low-fat diet Continue Atorvastatin 

## 2021-02-25 NOTE — Assessment & Plan Note (Signed)
Compliant with CPAP Encouraged weight loss as this can help reduce sleep apnea symptoms

## 2021-02-25 NOTE — Assessment & Plan Note (Signed)
TSH and free T4 today Will adjust Levothyroxine if needed based on labs 

## 2021-02-25 NOTE — Assessment & Plan Note (Signed)
A1c today No urine microalbumin secondary to ARB therapy Encouraged her to consume a low-carb diet and exercise weight loss No medications at this time Encourage routine eye exam, will request copy Foot exam today Encouraged her to get a flu shot fall Pneumovax and Prevnar UTD She declines COVID-vaccine

## 2021-02-25 NOTE — Assessment & Plan Note (Signed)
Controlled on Valsartan Reinforced DASH diet and exercise weight loss Kidney function reviewed

## 2021-02-25 NOTE — Patient Instructions (Signed)

## 2021-02-25 NOTE — Progress Notes (Signed)
Subjective:    Patient ID: Brooke King, female    DOB: 17-Apr-1941, 79 y.o.   MRN: [id number]  HPI  Patient presents the clinic today for follow-up of chronic conditions.  She is establishing care with me today, transferring care from [facility], NP.  HTN: Her BP today is 133/63.  She is taking Valsartan as prescribed.  ECG from 11/2013 reviewed.  HLD: Her last LDL was 70, triglycerides 94, 05/2020.  She denies myalgias on Atorvastatin.  She does not consume a low-fat diet.  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  She is not taking any oral diabetic medication at this time.  She does not check her sugars.  She checks her feet routinely.  Her last eye exam was 01/2021, [facility] Exam.  Flu 06/2019.  Pneumovax 05/2014.  Prevnar 11/2015.  COVID- declined.  GERD: She is not sure what triggers this.  She denies breakthrough on Omeprazole.  Upper GI from 10/2015 reviewed.  COPD: She denies chronic cough or shortness of breath.  She is taking Singulair, Spiriva and Albuterol as prescribed.  There are no PFTs on file. She does smoke.  OSA: She averages 6-7 hours of sleep per night with use of CPAP.  Sleep study from 04/2015 reviewed.  Hypothyroidism: s/p thyroidectomy d/t cancer. She denies any issues on her current dose of Levothyroxine.  She is not following with endocrinology.  Anxiety: She is not currently seeing a therapist.  She denies depression, SI/HI.  Insomnia: She can fall asleep but she has trouble staying asleep. She used to take Xanax in the past and is wondering if she can get a RX for this today.  Review of Systems      Past Medical History:  Diagnosis Date  . Allergy   . B12 deficiency   . Chest pain syndrome   . COPD (chronic obstructive pulmonary disease) ([place])   . Depression   . GERD (gastroesophageal reflux disease)   . Glaucoma   . History of colonic polyps   . History of degenerative disc disease    c-spine, status post cervical  laminectomy  . Hyperlipidemia   . Hypertension   . Hypothyroidism   . Insomnia   . Migraine   . Restless leg syndrome   . Seasonal allergies   . Sleep apnea   . Thyroid cancer ([place])   . Urinary incontinence     Current Outpatient Medications  Medication Sig Dispense Refill  . predniSONE (DELTASONE) 10 MG tablet Take 6 tabs on day 1, 5 tabs on day 2, 4 tabs on day 3, 3 tabs on day 4, 2 tabs on day 5, 1 tab on day 6 21 tablet 0  . albuterol (VENTOLIN HFA) 108 (90 Base) MCG/ACT inhaler Inhale 1-2 puffs into the lungs every 6 (six) hours as needed for wheezing or shortness of breath. 8 g 1  . aspirin EC 81 MG tablet Take 81 mg by mouth daily.    Marland Kitchen atorvastatin (LIPITOR) 10 MG tablet Take 1 tablet (10 mg total) by mouth daily. 90 tablet 3  . Calcium Carb-Cholecalciferol (CALCIUM + D3) 600-200 MG-UNIT TABS Take by mouth.    . cetirizine (ZYRTEC) 10 MG tablet Take 10 mg by mouth daily.    . Cranberry 1000 MG CAPS Take by mouth.    . gabapentin (NEURONTIN) 300 MG capsule Take 1 capsule (300 mg total) by mouth 3 (three) times daily. 270 capsule 1  . latanoprost (XALATAN) 0.005 %  ophthalmic solution 1 drop at bedtime.     . montelukast (SINGULAIR) 10 MG tablet Take 10 mg by mouth at bedtime.    . Multiple Vitamin (MULTIVITAMIN) tablet Take 1 tablet by mouth daily.    Marland Kitchen omeprazole (PRILOSEC) 20 MG capsule Take 1 capsule by mouth 2 (two) times daily.    Marland Kitchen SYNTHROID 112 MCG tablet Take 1 tablet (112 mcg total) by mouth daily before breakfast. 90 tablet 1  . tiotropium (SPIRIVA HANDIHALER) 18 MCG inhalation capsule Place 1 capsule (18 mcg total) into inhaler and inhale daily. 30 capsule 12  . valsartan (DIOVAN) 80 MG tablet Take 80 mg by mouth daily.    . vitamin B-12 (CYANOCOBALAMIN) 1000 MCG tablet Take 1,000 mcg by mouth daily.     No current facility-administered medications for this visit.    Allergies  Allergen Reactions  . Toviaz [Fesoterodine Fumarate Er] Other (See Comments)     Throat closed  . Cortisone   . Doxycycline Hyclate   . Hydrocodone   . Keflex [Cephalexin]   . Septra [Sulfamethoxazole-Trimethoprim]   . Tagamet [Cimetidine]   . Zoloft [Sertraline]   . Penicillins Rash    Family History  Problem Relation Age of Onset  . Heart disease Mother   . Breast cancer Neg Hx     Social History   Socioeconomic History  . Marital status: Widowed    Spouse name: Not on file  . Number of children: Not on file  . Years of education: Not on file  . Highest education level: Not on file  Occupational History  . Occupation: Retired  Tobacco Use  . Smoking status: Current Every Day Smoker    Packs/day: 1.00    Years: 40.00    Pack years: 40.00  . Smokeless tobacco: Never Used  Vaping Use  . Vaping Use: Never used  Substance and Sexual Activity  . Alcohol use: No  . Drug use: No  . Sexual activity: Not on file  Other Topics Concern  . Not on file  Social History Narrative  . Not on file   Social Determinants of Health   Financial Resource Strain: Not on file  Food Insecurity: Not on file  Transportation Needs: Not on file  Physical Activity: Not on file  Stress: Not on file  Social Connections: Not on file  Intimate Partner Violence: Not on file     Constitutional: Denies fever, malaise, fatigue, headache or abrupt weight changes.  HEENT: Patient reports postnasal drip.  Denies eye pain, eye redness, ear pain, ringing in the ears, wax buildup, runny nose, nasal congestion, bloody nose, or sore throat. Respiratory: Denies difficulty breathing, shortness of breath, cough or sputum production.   Cardiovascular: Denies chest pain, chest tightness, palpitations or swelling in the hands or feet.  Gastrointestinal: Denies abdominal pain, bloating, constipation, diarrhea or blood in the stool.  GU: Patient reports stress incontinence.  Denies urgency, frequency, pain with urination, burning sensation, blood in urine, odor or  discharge. Musculoskeletal: Denies decrease in range of motion, difficulty with gait, muscle pain or joint pain and swelling.  Skin: Denies redness, rashes, lesions or ulcercations.  Neurological: Patient reports peripheral neuropathy, insomnia.  Denies dizziness, difficulty with memory, difficulty with speech or problems with balance and coordination.  Psych: Patient has a history of anxiety.  Denies depression, SI/HI.  No other specific complaints in a complete review of systems (except as listed in HPI above).  Objective:   Physical Exam   BP 133/63 (BP  Location: Left Arm, Patient Position: Sitting, Cuff Size: Large)   Pulse 69   Temp (!) 97.5 F (36.4 C) (Temporal)   Resp 18   Ht 5\' 3"  (1.6 m)   Wt 178 lb (80.7 kg)   SpO2 99%   BMI 31.53 kg/m   Wt Readings from Last 3 Encounters:  11/25/20 180 lb 3.2 oz (81.7 kg)  10/29/20 178 lb 12.8 oz (81.1 kg)  08/22/20 179 lb 9.6 oz (81.5 kg)    General: Appears her stated age, obese, in NAD. Skin: Warm, dry and intact. No ulcerations noted. HEENT: Head: normal shape and size; Eyes: sclera [place] and EOMs intact; Neck:  Neck supple, trachea midline. No masses, lumps present.  Cardiovascular: Normal rate and rhythm. S1,S2 noted.  No murmur, rubs or gallops noted. No JVD or BLE edema.  Pulmonary/Chest: Normal effort and positive vesicular breath sounds. No respiratory distress. No wheezes, rales or ronchi noted.  Abdomen: Normal bowel sounds.  Musculoskeletal: No difficulty with gait.  Neurological: Alert and oriented.  Psychiatric: Mood and affect normal. Behavior is normal. Judgment and thought content normal.     BMET    Component Value Date/Time   NA 142 10/29/2020 1125   K 4.4 10/29/2020 1125   K 3.8 12/15/2013 1202   CL 107 10/29/2020 1125   CO2 27 10/29/2020 1125   GLUCOSE 124 (H) 10/29/2020 1125   BUN 13 10/29/2020 1125   CREATININE 0.79 10/29/2020 1125   CALCIUM 9.8 10/29/2020 1125   GFRNONAA >60 01/19/2012 0949    GFRAA >60 01/19/2012 0949    Lipid Panel  No results found for: CHOL, TRIG, HDL, CHOLHDL, VLDL, LDLCALC  CBC    Component Value Date/Time   WBC 8.1 10/29/2020 1125   RBC 4.39 10/29/2020 1125   HGB 13.9 10/29/2020 1125   HCT 40.6 10/29/2020 1125   PLT 131 (L) 10/29/2020 1125   MCV 92.5 10/29/2020 1125   MCH 31.7 10/29/2020 1125   MCHC 34.2 10/29/2020 1125   RDW 12.3 10/29/2020 1125   LYMPHSABS 1,976 10/29/2020 1125   EOSABS 397 10/29/2020 1125   BASOSABS 89 10/29/2020 1125    Hgb A1C Lab Results  Component Value Date   HGBA1C 6.2 (A) 10/29/2020           Assessment & Plan:     Webb Silversmith, NP This visit occurred during the SARS-CoV-2 public health emergency.  Safety protocols were in place, including screening questions prior to the visit, additional usage of staff PPE, and extensive cleaning of exam room while observing appropriate contact time as indicated for disinfecting solutions.

## 2021-02-25 NOTE — Assessment & Plan Note (Signed)
Will hold Omeprazole x2 weeks and attempt to not wean medication If she has reflux more than 3 days a week after stopping medication or worsening breathing issues advised her to restart medication and let me know

## 2021-02-25 NOTE — Assessment & Plan Note (Signed)
I am not comfortable giving her prescription for Xanax for sleep We will trial Trazodone

## 2021-02-25 NOTE — Assessment & Plan Note (Signed)
Continue Singulair, Spiriva and Albuterol Encourage smoking cessation

## 2021-02-25 NOTE — Assessment & Plan Note (Signed)
Stable off meds Support offered 

## 2021-02-25 NOTE — Assessment & Plan Note (Signed)
Not medicated Encouraged timed voiding Will monitor

## 2021-02-26 LAB — HEMOGLOBIN A1C
Hgb A1c MFr Bld: 6.4 % of total Hgb — ABNORMAL HIGH (ref <5.7)
Mean Plasma Glucose: 137 mg/dL
eAG (mmol/L): 7.6 mmol/L

## 2021-02-26 LAB — T4, FREE: Free T4: 1.3 ng/dL (ref 0.8–1.8)

## 2021-02-26 LAB — TSH: TSH: 0.92 mIU/L (ref 0.40–4.50)

## 2021-03-24 ENCOUNTER — Other Ambulatory Visit: Payer: Self-pay | Admitting: Family Medicine

## 2021-03-24 DIAGNOSIS — E039 Hypothyroidism, unspecified: Secondary | ICD-10-CM

## 2021-04-10 ENCOUNTER — Ambulatory Visit: Payer: Self-pay | Admitting: *Deleted

## 2021-04-10 NOTE — Telephone Encounter (Signed)
Reason for Disposition  [1] Mild-moderate nosebleed AND [2] bleeding stopped now  Answer Assessment - Initial Assessment Questions 1. AMOUNT OF BLEEDING: "How bad is the bleeding?" "How much blood was lost?" "Has the bleeding stopped?"   - MILD: needed a couple tissues   - MODERATE: needed many tissues   - SEVERE: large blood clots, soaked many tissues, lasted more than 30 minutes      Moderate to severe 2. ONSET: "When did the nosebleed start?"      Today  3. FREQUENCY: "How many nosebleeds have you had in the last 24 hours?"      1 4. RECURRENT SYMPTOMS: "Have there been other recent nosebleeds?" If Yes, ask: "How long did it take you to stop the bleeding?" "What worked best?"     Since a couple of weeks  5. CAUSE: "What do you think caused this nosebleed?"     Not sure  6. LOCAL FACTORS: "Do you have any cold symptoms?", "Have you been rubbing or picking at your nose?"     Sleep with CPAP and nose runs in am  7. SYSTEMIC FACTORS: "Do you have high blood pressure or any bleeding problems?"     HTN 8. BLOOD THINNERS: "Do you take any blood thinners?" (e.g., aspirin, clopidogrel / Plavix, coumadin, heparin). Notes: Other strong blood thinners include: Arixtra (fondaparinux), Eliquis (apixaban), Pradaxa (dabigatran), and Xarelto (rivaroxaban).     Aspirin  9. OTHER SYMPTOMS: "Do you have any other symptoms?" (e.g., lightheadedness)     No  10. PREGNANCY: "Is there any chance you are pregnant?" "When was your last menstrual period?"       na  Protocols used: Nosebleed-A-AH

## 2021-04-10 NOTE — Telephone Encounter (Signed)
Patient experiencing nosebleeds. She is able to stop it, but trying to find out what may be causing it and what else she can do. Please call back   Called patient to review sx of nosebleed. C/o nose bled today and required multiple tissues and coughing up blood clot due to drainage to back of throat. Able to stop nose bleed . Denies lightheadedness, and any other nose bleeds today. Patient reports she wears a CPAP and could have dried her nose out if not enough water in CPAP. Patient take aspirin. Patient declined need for appt at this time. Care advise given. Patient verbalized understanding of care advise and to call back or go to [facility] or ED if symptoms worsen.

## 2021-04-17 DIAGNOSIS — J342 Deviated nasal septum: Secondary | ICD-10-CM | POA: Diagnosis not present

## 2021-04-17 DIAGNOSIS — R040 Epistaxis: Secondary | ICD-10-CM | POA: Diagnosis not present

## 2021-04-17 DIAGNOSIS — J340 Abscess, furuncle and carbuncle of nose: Secondary | ICD-10-CM | POA: Diagnosis not present

## 2021-04-23 ENCOUNTER — Other Ambulatory Visit: Payer: Self-pay | Admitting: Family Medicine

## 2021-04-23 DIAGNOSIS — E1142 Type 2 diabetes mellitus with diabetic polyneuropathy: Secondary | ICD-10-CM

## 2021-04-23 NOTE — Telephone Encounter (Signed)
Requested Prescriptions  Pending Prescriptions Disp Refills  . gabapentin (NEURONTIN) 300 MG capsule [Pharmacy Med Name: Gabapentin 300 MG Oral Capsule] 270 capsule 1    Sig: TAKE 1 CAPSULE BY MOUTH 3  TIMES DAILY     Neurology: Anticonvulsants - gabapentin Passed - 04/23/2021 10:13 PM      Passed - Valid encounter within last 12 months    Recent Outpatient Visits          1 month ago [facility], Coralie Keens, NP   2 months ago Viral sinusitis   [facility] [place], [place] W, NP   4 months ago Type 2 diabetes mellitus with peripheral neuropathy [facility])   [place], DO   5 months ago Hypothyroidism, unspecified type   [place], NP   8 months ago Encounter to establish care with new doctor   [facility], Lupita Raider, FNP      Future Appointments            In 4 months Baity, Coralie Keens, NP [facility], [facility]

## 2021-05-12 ENCOUNTER — Other Ambulatory Visit: Payer: Self-pay | Admitting: Unknown Physician Specialty

## 2021-05-12 DIAGNOSIS — J449 Chronic obstructive pulmonary disease, unspecified: Secondary | ICD-10-CM

## 2021-05-12 NOTE — Telephone Encounter (Signed)
Requested Prescriptions  Pending Prescriptions Disp Refills  . albuterol (VENTOLIN HFA) 108 (90 Base) MCG/ACT inhaler [Pharmacy Med Name: Albuterol Sulfate HFA 108 (90 Base) MCG/ACT Inhalation Aerosol Solution] 8 g 1    Sig: USE 1 TO 2 INHALATIONS BY  MOUTH INTO THE LUNGS EVERY  6 HOURS AS NEEDED FOR  WHEEZING OR SHORTNESS OF  BREATH     Pulmonology:  Beta Agonists Failed - 05/12/2021  5:17 PM      Failed - One inhaler should last at least one month. If the patient is requesting refills earlier, contact the patient to check for uncontrolled symptoms.      Passed - Valid encounter within last 12 months    Recent Outpatient Visits          2 months ago [facility] [place], Coralie Keens, NP   2 months ago Viral sinusitis   [facility] [place], [place], NP   5 months ago Type 2 diabetes mellitus with peripheral neuropathy [facility])   [place], DO   6 months ago Hypothyroidism, unspecified type   [place], NP   8 months ago Encounter to establish care with new doctor   [facility], Lupita Raider, FNP      Future Appointments            In 3 months Baity, Coralie Keens, NP [facility], [facility]

## 2021-05-19 DIAGNOSIS — G4733 Obstructive sleep apnea (adult) (pediatric): Secondary | ICD-10-CM | POA: Diagnosis not present

## 2021-05-29 ENCOUNTER — Other Ambulatory Visit: Payer: Self-pay | Admitting: Internal Medicine

## 2021-05-29 DIAGNOSIS — J449 Chronic obstructive pulmonary disease, unspecified: Secondary | ICD-10-CM

## 2021-05-30 NOTE — Telephone Encounter (Signed)
Requested medications are due for refill today early even for mail order  Requested medications are on the active medication list yes  Last refill 04/28/21  Last visit 02/27/21  Future visit scheduled 08/2021  Notes to clinic requesting early, however, it is mail order. Please assess.

## 2021-05-30 NOTE — Telephone Encounter (Signed)
Can you call and see how often she is using her Albuterol inhaler? She is requesting a refill but this was filled 2 weeks ago.

## 2021-06-03 ENCOUNTER — Other Ambulatory Visit: Payer: Self-pay

## 2021-06-03 DIAGNOSIS — E785 Hyperlipidemia, unspecified: Secondary | ICD-10-CM

## 2021-06-03 MED ORDER — ATORVASTATIN CALCIUM 10 MG PO TABS: 10.0000 mg | ORAL_TABLET | Freq: Every day | ORAL | 3 refills | Status: DC

## 2021-06-03 NOTE — Telephone Encounter (Signed)
The pt stated that she doesn't need the albuterol inhaler at this time. She admits that she is using the inhaler more frequently over the last 2 weeks. She said that she use it about 2 times a day. She said she has been more congested over the past 2 weeks mostly in the morning and at night. She associate the congestion with her CPAP machine. I scheduled her an appt for next Tuesday, Sept 20th @ 1:20pm She received the inhalers from [facility] recently and admits that she used one inhaler up already.

## 2021-06-03 NOTE — Telephone Encounter (Signed)
See previous message. Can we call her and see how often she is using this.

## 2021-06-10 ENCOUNTER — Ambulatory Visit (INDEPENDENT_AMBULATORY_CARE_PROVIDER_SITE_OTHER): Payer: Medicare Other | Admitting: Internal Medicine

## 2021-06-10 ENCOUNTER — Other Ambulatory Visit: Payer: Self-pay

## 2021-06-10 ENCOUNTER — Encounter: Payer: Self-pay | Admitting: Internal Medicine

## 2021-06-10 VITALS — BP 113/62 | HR 73 | Temp 97.8°F | Resp 17 | Ht 63.0 in | Wt 181.2 lb

## 2021-06-10 DIAGNOSIS — J410 Simple chronic bronchitis: Secondary | ICD-10-CM | POA: Diagnosis not present

## 2021-06-10 DIAGNOSIS — I700 Atherosclerosis of aorta: Secondary | ICD-10-CM | POA: Insufficient documentation

## 2021-06-10 DIAGNOSIS — F17200 Nicotine dependence, unspecified, uncomplicated: Secondary | ICD-10-CM | POA: Diagnosis not present

## 2021-06-10 DIAGNOSIS — F419 Anxiety disorder, unspecified: Secondary | ICD-10-CM | POA: Diagnosis not present

## 2021-06-10 DIAGNOSIS — Z23 Encounter for immunization: Secondary | ICD-10-CM | POA: Diagnosis not present

## 2021-06-10 MED ORDER — HYDROXYZINE HCL 10 MG PO TABS: 10.0000 mg | ORAL_TABLET | Freq: Every day | ORAL | 0 refills | Status: DC | PRN

## 2021-06-10 NOTE — Assessment & Plan Note (Signed)
I do not think her COPD is deteriorating causing worsening SOB Continue Spiriva and Albuterol as prescribed Encouraged smoking cessation

## 2021-06-10 NOTE — Assessment & Plan Note (Signed)
Encouraged smoking cessation 

## 2021-06-10 NOTE — Assessment & Plan Note (Signed)
Will trial Hydroxyzine 10 mg daily prn- sedation caution given Support offered

## 2021-06-10 NOTE — Progress Notes (Signed)
Subjective:    Patient ID: Brooke King, female    DOB: 03-01-1941, 80 y.o.   MRN: [id number]  HPI  Pt presents to the clinic today for follow up of COPD. She denies chronic cough. She is taking Spiriva as prescribed. She does feel like she has been using her Albuterol more frequently. She feels like this is related to anxiety. She reports when she gets anxious and upset, she feels short of breath which makes her feel like she needs to use her Albuterol inhaler. She has a history of depression. She is not currently taking any medications for this. She is not seeing a therapist. There are no PFT's on file. She does smoke.  Review of Systems     Past Medical History:  Diagnosis Date   Allergy    B12 deficiency    Chest pain syndrome    COPD (chronic obstructive pulmonary disease) ([facility])    Depression    GERD (gastroesophageal reflux disease)    Glaucoma    History of colonic polyps    History of degenerative disc disease    c-spine, status post cervical laminectomy   Hyperlipidemia    Hypertension    Hypothyroidism    Insomnia    Migraine    Restless leg syndrome    Seasonal allergies    Sleep apnea    Thyroid cancer ([facility])    Urinary incontinence     Current Outpatient Medications  Medication Sig Dispense Refill   albuterol (VENTOLIN HFA) 108 (90 Base) MCG/ACT inhaler USE 1 TO 2 INHALATIONS BY  MOUTH INTO THE LUNGS EVERY  6 HOURS AS NEEDED FOR  WHEEZING OR SHORTNESS OF  BREATH 8 g 1   aspirin EC 81 MG tablet Take 81 mg by mouth daily.     atorvastatin (LIPITOR) 10 MG tablet Take 1 tablet (10 mg total) by mouth daily. 90 tablet 3   Calcium Carb-Cholecalciferol (CALCIUM + D3) 600-200 MG-UNIT TABS Take by mouth.     cetirizine (ZYRTEC) 10 MG tablet Take 10 mg by mouth daily.     Cranberry 1000 MG CAPS Take by mouth.     gabapentin (NEURONTIN) 300 MG capsule TAKE 1 CAPSULE BY MOUTH 3  TIMES DAILY 270 capsule 1   latanoprost (XALATAN) 0.005 % ophthalmic solution 1 drop at  bedtime.      montelukast (SINGULAIR) 10 MG tablet Take 10 mg by mouth at bedtime.     Multiple Vitamin (MULTIVITAMIN) tablet Take 1 tablet by mouth daily. (Patient not taking: Reported on 02/25/2021)     omeprazole (PRILOSEC) 20 MG capsule Take 1 capsule by mouth 2 (two) times daily.     SYNTHROID 112 MCG tablet TAKE 1 TABLET BY MOUTH  DAILY BEFORE BREAKFAST 90 tablet 3   tiotropium (SPIRIVA HANDIHALER) 18 MCG inhalation capsule Place 1 capsule (18 mcg total) into inhaler and inhale daily. 30 capsule 12   traZODone (DESYREL) 50 MG tablet Take 0.5 tablets (25 mg total) by mouth at bedtime as needed for sleep. 30 tablet 0   valsartan (DIOVAN) 80 MG tablet Take 80 mg by mouth daily.     vitamin B-12 (CYANOCOBALAMIN) 1000 MCG tablet Take 1,000 mcg by mouth daily.     No current facility-administered medications for this visit.    Allergies  Allergen Reactions   Toviaz [Fesoterodine Fumarate Er] Other (See Comments)    Throat closed   Cortisone    Doxycycline Hyclate    Hydrocodone    Keflex [Cephalexin]  Septra [Sulfamethoxazole-Trimethoprim]    Tagamet [Cimetidine]    Zoloft [Sertraline]    Penicillins Rash    Family History  Problem Relation Age of Onset   Heart disease Mother    Breast cancer Neg Hx     Social History   Socioeconomic History   Marital status: Widowed    Spouse name: Not on file   Number of children: Not on file   Years of education: Not on file   Highest education level: Not on file  Occupational History   Occupation: Retired  Tobacco Use   Smoking status: Every Day    Packs/day: 1.00    Years: 40.00    Pack years: 40.00    Types: Cigarettes   Smokeless tobacco: Never  Vaping Use   Vaping Use: Never used  Substance and Sexual Activity   Alcohol use: No   Drug use: No   Sexual activity: Not on file  Other Topics Concern   Not on file  Social History Narrative   Not on file   Social Determinants of Health   Financial Resource Strain: Not  on file  Food Insecurity: Not on file  Transportation Needs: Not on file  Physical Activity: Not on file  Stress: Not on file  Social Connections: Not on file  Intimate Partner Violence: Not on file     Constitutional: Denies fever, malaise, fatigue, headache or abrupt weight changes.  HEENT: Pt reports post nasal drip. Denies eye pain, eye redness, ear pain, ringing in the ears, wax buildup, runny nose, nasal congestion, bloody nose, or sore throat. Respiratory: Pt reports intermittent shortness of breath. Denies difficulty breathing, cough or sputum production.   Cardiovascular: Denies chest pain, chest tightness, palpitations or swelling in the hands or feet.  Neurological: Pt reports insomnia. Denies dizziness, difficulty with memory, difficulty with speech or problems with balance and coordination.  Psych: Pt reports anxiety. Denies depression, SI/HI.  No other specific complaints in a complete review of systems (except as listed in HPI above).  Objective:   Physical Exam  BP 113/62 (BP Location: Right Arm, Patient Position: Sitting, Cuff Size: Normal)   Pulse 73   Temp 97.8 F (36.6 C) (Temporal)   Resp 17   Ht 5\' 3"  (1.6 m)   Wt 181 lb 3.2 oz (82.2 kg)   SpO2 99%   BMI 32.10 kg/m   Wt Readings from Last 3 Encounters:  02/25/21 178 lb (80.7 kg)  11/25/20 180 lb 3.2 oz (81.7 kg)  10/29/20 178 lb 12.8 oz (81.1 kg)    General: Appears her stated age, obese, in NAD. HEENT: Head: normal shape and size; Eyes: sclera [place] and EOMs intact;  Cardiovascular: Normal rate and rhythm. S1,S2 noted.  No murmur, rubs or gallops noted.  Pulmonary/Chest: Normal effort and positive vesicular breath sounds. No respiratory distress. No wheezes, rales or ronchi noted.  Neurological: Alert and oriented.  Psychiatric: Mood and affect normal. Mildly anxious appearing. Judgment and thought content normal.    BMET    Component Value Date/Time   NA 142 10/29/2020 1125   K 4.4 10/29/2020  1125   K 3.8 12/15/2013 1202   CL 107 10/29/2020 1125   CO2 27 10/29/2020 1125   GLUCOSE 124 (H) 10/29/2020 1125   BUN 13 10/29/2020 1125   CREATININE 0.79 10/29/2020 1125   CALCIUM 9.8 10/29/2020 1125   GFRNONAA >60 01/19/2012 0949   GFRAA >60 01/19/2012 0949      CBC    Component Value  Date/Time   WBC 8.1 10/29/2020 1125   RBC 4.39 10/29/2020 1125   HGB 13.9 10/29/2020 1125   HCT 40.6 10/29/2020 1125   PLT 131 (L) 10/29/2020 1125   MCV 92.5 10/29/2020 1125   MCH 31.7 10/29/2020 1125   MCHC 34.2 10/29/2020 1125   RDW 12.3 10/29/2020 1125   LYMPHSABS 1,976 10/29/2020 1125   EOSABS 397 10/29/2020 1125   BASOSABS 89 10/29/2020 1125    Hgb A1C Lab Results  Component Value Date   HGBA1C 6.4 (H) 02/25/2021            Assessment & Plan:    Webb Silversmith, NP This visit occurred during the SARS-CoV-2 public health emergency.  Safety protocols were in place, including screening questions prior to the visit, additional usage of staff PPE, and extensive cleaning of exam room while observing appropriate contact time as indicated for disinfecting solutions.

## 2021-06-10 NOTE — Patient Instructions (Signed)

## 2021-06-10 NOTE — Addendum Note (Signed)
Addended by: Wilson Singer on: 06/10/2021 02:39 PM   Modules accepted: Orders

## 2021-06-11 ENCOUNTER — Other Ambulatory Visit: Payer: Self-pay

## 2021-06-18 DIAGNOSIS — G4733 Obstructive sleep apnea (adult) (pediatric): Secondary | ICD-10-CM | POA: Diagnosis not present

## 2021-07-14 DIAGNOSIS — H401112 Primary open-angle glaucoma, right eye, moderate stage: Secondary | ICD-10-CM | POA: Diagnosis not present

## 2021-07-14 DIAGNOSIS — E119 Type 2 diabetes mellitus without complications: Secondary | ICD-10-CM | POA: Diagnosis not present

## 2021-07-14 LAB — HM DIABETES EYE EXAM

## 2021-08-18 ENCOUNTER — Ambulatory Visit: Payer: Self-pay

## 2021-08-18 NOTE — Telephone Encounter (Signed)
Pt. Reports she had a large nose bleed Saturday night. Passed large blood clots, blood on clothing and bed linens. Has been sleeping recliner. "I was afraid it would happen again." No availability today. Appointment for tomorrow. Asking to be worked in today. No answer on FC line. Please advise pt.    Reason for Disposition  [1] Large amount of blood has been lost (e.g., 1 cup or 240 ml) AND [2] bleeding now controlled (stopped)  Answer Assessment - Initial Assessment Questions 1. AMOUNT OF BLEEDING: "How bad is the bleeding?" "How much blood was lost?" "Has the bleeding stopped?"   - MILD: needed a couple tissues   - MODERATE: needed many tissues   - SEVERE: large blood clots, soaked many tissues, lasted more than 30 minutes      Moderate 2. ONSET: "When did the nosebleed start?"      Saturday 3. FREQUENCY: "How many nosebleeds have you had in the last 24 hours?"      1 4. RECURRENT SYMPTOMS: "Have there been other recent nosebleeds?" If Yes, ask: "How long did it take you to stop the bleeding?" "What worked best?"      Yes 5. CAUSE: "What do you think caused this nosebleed?"     Unsure 6. LOCAL FACTORS: "Do you have any cold symptoms?", "Have you been rubbing or picking at your nose?"     Yes 7. SYSTEMIC FACTORS: "Do you have high blood pressure or any bleeding problems?"     143/79 8. BLOOD THINNERS: "Do you take any blood thinners?" (e.g., aspirin, clopidogrel / Plavix, coumadin, heparin). Notes: Other strong blood thinners include: Arixtra (fondaparinux), Eliquis (apixaban), Pradaxa (dabigatran), and Xarelto (rivaroxaban).     Aspirin 9. OTHER SYMPTOMS: "Do you have any other symptoms?" (e.g., lightheadedness)     No 10. PREGNANCY: "Is there any chance you are pregnant?" "When was your last menstrual period?"       No  Protocols used: Nosebleed-A-AH

## 2021-08-18 NOTE — Telephone Encounter (Signed)
I spoke with the patient and she informed me that she did notice some mild bleeding today, but she was able to get it to stop. I informed the patient if it starts back and she is unable to get it to stop after 30 minute or if she losing a large amount of blood to seek emergent care. She verbalize understanding, no questions or concerns.

## 2021-08-19 ENCOUNTER — Encounter: Payer: Self-pay | Admitting: Internal Medicine

## 2021-08-19 ENCOUNTER — Other Ambulatory Visit: Payer: Self-pay

## 2021-08-19 ENCOUNTER — Ambulatory Visit (INDEPENDENT_AMBULATORY_CARE_PROVIDER_SITE_OTHER): Payer: Medicare Other | Admitting: Internal Medicine

## 2021-08-19 VITALS — BP 120/57 | HR 81 | Temp 97.9°F | Resp 18 | Ht 63.0 in | Wt 179.4 lb

## 2021-08-19 DIAGNOSIS — I700 Atherosclerosis of aorta: Secondary | ICD-10-CM

## 2021-08-19 DIAGNOSIS — J410 Simple chronic bronchitis: Secondary | ICD-10-CM | POA: Diagnosis not present

## 2021-08-19 DIAGNOSIS — D696 Thrombocytopenia, unspecified: Secondary | ICD-10-CM | POA: Insufficient documentation

## 2021-08-19 DIAGNOSIS — F5101 Primary insomnia: Secondary | ICD-10-CM

## 2021-08-19 DIAGNOSIS — R040 Epistaxis: Secondary | ICD-10-CM | POA: Diagnosis not present

## 2021-08-19 DIAGNOSIS — F17200 Nicotine dependence, unspecified, uncomplicated: Secondary | ICD-10-CM

## 2021-08-19 LAB — CBC
HCT: 42.9 % (ref 35.0–45.0)
Hemoglobin: 14.2 g/dL (ref 11.7–15.5)
MCH: 30.3 pg (ref 27.0–33.0)
MCHC: 33.1 g/dL (ref 32.0–36.0)
MCV: 91.7 fL (ref 80.0–100.0)
MPV: 13.6 fL — ABNORMAL HIGH (ref 7.5–12.5)
Platelets: 150 10*3/uL (ref 140–400)
RBC: 4.68 10*6/uL (ref 3.80–5.10)
RDW: 12.6 % (ref 11.0–15.0)
WBC: 8.5 10*3/uL (ref 3.8–10.8)

## 2021-08-19 MED ORDER — TRAZODONE HCL 50 MG PO TABS: 25.0000 mg | ORAL_TABLET | Freq: Every evening | ORAL | 0 refills | Status: DC | PRN

## 2021-08-19 NOTE — Patient Instructions (Signed)
Nosebleed, Adult A nosebleed is when blood comes out of the nose. Nosebleeds are common and can be caused by many things. They are usually not a sign of a serious medical problem. Follow these instructions at home: When you have a nosebleed:  Sit down. Tilt your head a little forward. Follow these steps: Pinch your nose with a clean towel or tissue. Keep pinching your nose for 5 minutes. Do not let go. After 5 minutes, let go of your nose. If there is still bleeding, do these steps again. Keep doing these steps until the bleeding stops. Do not put tissues or other things in your nose to stop the bleeding. Avoid lying down or putting your head back. Use a nose spray decongestant as told by your doctor. After a nosebleed: Try not to blow your nose or sniffle for several hours. Try not to strain, lift, or bend at the waist for several days. Aspirin and blood-thinning medicines make bleeding more likely. If you take these medicines: Ask your doctor if you should stop taking them or if you should change how much you take. Do not stop taking the medicine unless your doctor tells you to. If your nosebleed was caused by dryness, use over-the-counter saline nasal spray or gel and humidifier as told by your doctor. This will keep the inside of your nose moist and allow it to heal. If you need to use one of these products: Choose one that is water-soluble. Use only as much as you need and use it only as often as needed. Do not lie down right away after you use it. If you get nosebleeds often, talk with your doctor about treatments. These may include: Nasal cautery. A chemical swab or electrical device is used to lightly burn tiny blood vessels inside the nose. This helps stop or prevent nosebleeds. Nasal packing. A gauze or other material is placed in the nose to keep constant pressure on the bleeding area. Contact a doctor if: You have a fever. You get nosebleeds often. You are getting  nosebleeds more often than usual. You bruise very easily. You have something stuck in your nose. You have bleeding in your mouth. You vomit or cough up brown material. You get a nosebleed after you start a new medicine. Get help right away if: You have a nosebleed after you fall or hurt your head. Your nosebleed does not go away after 20 minutes. You feel dizzy or weak. You have unusual bleeding from other parts of your body. You have unusual bruising on other parts of your body. You get sweaty. You vomit blood. Summary Nosebleeds are common. They are usually not a sign of a serious medical problem. When you have a nosebleed, sit down and tilt your head a little forward. Pinch your nose with a clean tissue for 5 minutes. Use saline spray or saline gel and a humidifier as told by your doctor. Get help right away if your nosebleed does not go away after 20 minutes. This information is not intended to replace advice given to you by your health care provider. Make sure you discuss any questions you have with your health care provider. Document Revised: 07/06/2019 Document Reviewed: 07/06/2019 Elsevier Patient Education  2022 [employer].

## 2021-08-19 NOTE — Assessment & Plan Note (Signed)
Continue baby ASA daily

## 2021-08-19 NOTE — Assessment & Plan Note (Signed)
Encouraged smoking cessation 

## 2021-08-19 NOTE — Progress Notes (Signed)
Subjective:    Patient ID: Brooke King, female    DOB: 1941/07/22, 80 y.o.   MRN: [id number]  HPI  Pt presents to the clinic today with c/o a nose bleed. She reports nose bleeds on 11/26 and 11/28. There first nose bleed took about 1 hour to stop. The bleeding yesterday was minimal. She is not using any nasal sprays but is using Gentamycin nasal ointment. She thinks her CPAP machine may be causing these nosebleeds. She is taking ASA, has a history of thrombocytopenia. Her last platelet count was 131, 10/2020. She has an appt with ENT in January for the same.  She also would like a refill on her Trazadone. She reports it helps her to fall asleep and stay asleep.  She also reports that she can no longer afford her Spiriva. She is using her Albuterol as prescribed. There are no PFT's on file.  Review of Systems     Past Medical History:  Diagnosis Date   Allergy    B12 deficiency    Chest pain syndrome    COPD (chronic obstructive pulmonary disease) (HCC)    Depression    GERD (gastroesophageal reflux disease)    Glaucoma    History of colonic polyps    History of degenerative disc disease    c-spine, status post cervical laminectomy   Hyperlipidemia    Hypertension    Hypothyroidism    Insomnia    Migraine    Restless leg syndrome    Seasonal allergies    Sleep apnea    Thyroid cancer (HCC)    Urinary incontinence     Current Outpatient Medications  Medication Sig Dispense Refill   albuterol (VENTOLIN HFA) 108 (90 Base) MCG/ACT inhaler USE 1 TO 2 INHALATIONS BY  MOUTH INTO THE LUNGS EVERY  6 HOURS AS NEEDED FOR  WHEEZING OR SHORTNESS OF  BREATH 8 g 1   aspirin EC 81 MG tablet Take 81 mg by mouth daily.     atorvastatin (LIPITOR) 10 MG tablet Take 1 tablet (10 mg total) by mouth daily. 90 tablet 3   Calcium Carb-Cholecalciferol (CALCIUM + D3) 600-200 MG-UNIT TABS Take by mouth.     gabapentin (NEURONTIN) 300 MG capsule TAKE 1 CAPSULE BY MOUTH 3  TIMES DAILY 270 capsule 1    hydrOXYzine (ATARAX/VISTARIL) 10 MG tablet Take 1 tablet (10 mg total) by mouth daily as needed. 30 tablet 0   latanoprost (XALATAN) 0.005 % ophthalmic solution 1 drop at bedtime.      montelukast (SINGULAIR) 10 MG tablet Take 10 mg by mouth at bedtime.     omeprazole (PRILOSEC) 20 MG capsule Take 1 capsule by mouth daily.     SYNTHROID 112 MCG tablet TAKE 1 TABLET BY MOUTH  DAILY BEFORE BREAKFAST 90 tablet 3   tiotropium (SPIRIVA HANDIHALER) 18 MCG inhalation capsule Place 1 capsule (18 mcg total) into inhaler and inhale daily. 30 capsule 12   traZODone (DESYREL) 50 MG tablet Take 0.5 tablets (25 mg total) by mouth at bedtime as needed for sleep. 30 tablet 0   valsartan (DIOVAN) 80 MG tablet Take 80 mg by mouth daily.     vitamin B-12 (CYANOCOBALAMIN) 1000 MCG tablet Take 1,000 mcg by mouth daily.     No current facility-administered medications for this visit.    Allergies  Allergen Reactions   Toviaz [Fesoterodine Fumarate Er] Other (See Comments)    Throat closed   Cortisone    Doxycycline Hyclate    Hydrocodone  Keflex [Cephalexin]    Septra [Sulfamethoxazole-Trimethoprim]    Tagamet [Cimetidine]    Zoloft [Sertraline]    Penicillins Rash    Family History  Problem Relation Age of Onset   Heart disease Mother    Breast cancer Neg Hx     Social History   Socioeconomic History   Marital status: Widowed    Spouse name: Not on file   Number of children: Not on file   Years of education: Not on file   Highest education level: Not on file  Occupational History   Occupation: Retired  Tobacco Use   Smoking status: Every Day    Packs/day: 1.00    Years: 40.00    Pack years: 40.00    Types: Cigarettes   Smokeless tobacco: Never  Vaping Use   Vaping Use: Never used  Substance and Sexual Activity   Alcohol use: No   Drug use: No   Sexual activity: Not on file  Other Topics Concern   Not on file  Social History Narrative   Not on file   Social Determinants of  Health   Financial Resource Strain: Not on file  Food Insecurity: Not on file  Transportation Needs: Not on file  Physical Activity: Not on file  Stress: Not on file  Social Connections: Not on file  Intimate Partner Violence: Not on file     Constitutional: Denies fever, malaise, fatigue, headache or abrupt weight changes.  HEENT: Pt reports nose bleeds. Denies eye pain, eye redness, ear pain, ringing in the ears, wax buildup, runny nose, nasal congestion, or sore throat. Respiratory: Denies difficulty breathing, shortness of breath, cough or sputum production.   Cardiovascular: Denies chest pain, chest tightness, palpitations or swelling in the hands or feet.  Skin: Denies redness, rashes, lesions or ulcercations.  Neurological: Pt reports insomnia. Denies dizziness, difficulty with memory, difficulty with speech or problems with balance and coordination.    No other specific complaints in a complete review of systems (except as listed in HPI above).  Objective:   Physical Exam  BP (!) 120/57 (BP Location: Left Arm, Patient Position: Sitting, Cuff Size: Normal)   Pulse 81   Temp 97.9 F (36.6 C) (Temporal)   Resp 18   Ht 5\' 3"  (1.6 m)   Wt 179 lb 6.4 oz (81.4 kg)   SpO2 99%   BMI 31.78 kg/m   Wt Readings from Last 3 Encounters:  06/10/21 181 lb 3.2 oz (82.2 kg)  02/25/21 178 lb (80.7 kg)  11/25/20 180 lb 3.2 oz (81.7 kg)    General: Appears her stated age, obese, in NAD. Skin: Warm, dry and intact. No bruising noted. HEENT: Head: normal shape and size; Nose: mucosa pink and moist, septum midline, no active bleeding noted;  Cardiovascular: Normal rate and rhythm. S1,S2 noted.  No murmur, rubs or gallops noted.  Pulmonary/Chest: Normal effort and positive vesicular breath sounds. No respiratory distress. No wheezes, rales or ronchi noted.  Musculoskeletal: No difficulty with gait.  Neurological: Alert and oriented.   BMET    Component Value Date/Time   NA 142  10/29/2020 1125   K 4.4 10/29/2020 1125   K 3.8 12/15/2013 1202   CL 107 10/29/2020 1125   CO2 27 10/29/2020 1125   GLUCOSE 124 (H) 10/29/2020 1125   BUN 13 10/29/2020 1125   CREATININE 0.79 10/29/2020 1125   CALCIUM 9.8 10/29/2020 1125   GFRNONAA >60 01/19/2012 0949   GFRAA >60 01/19/2012 0949    Lipid Panel  No results found for: CHOL, TRIG, HDL, CHOLHDL, VLDL, LDLCALC  CBC    Component Value Date/Time   WBC 8.1 10/29/2020 1125   RBC 4.39 10/29/2020 1125   HGB 13.9 10/29/2020 1125   HCT 40.6 10/29/2020 1125   PLT 131 (L) 10/29/2020 1125   MCV 92.5 10/29/2020 1125   MCH 31.7 10/29/2020 1125   MCHC 34.2 10/29/2020 1125   RDW 12.3 10/29/2020 1125   LYMPHSABS 1,976 10/29/2020 1125   EOSABS 397 10/29/2020 1125   BASOSABS 89 10/29/2020 1125    Hgb A1C Lab Results  Component Value Date   HGBA1C 6.4 (H) 02/25/2021            Assessment & Plan:   Epistasis, Thrombocytopenia:  She needs ASA given history of DM 2, HLD with aortic atherosclerosis CBC today Education provided on how to properly stop a nose bleed She has a follow up appt with ENT planned  RTC in 2 weeks for annual exam Webb Silversmith, NP This visit occurred during the SARS-CoV-2 public health emergency.  Safety protocols were in place, including screening questions prior to the visit, additional usage of staff PPE, and extensive cleaning of exam room while observing appropriate contact time as indicated for disinfecting solutions.

## 2021-08-19 NOTE — Assessment & Plan Note (Signed)
She is having trouble affording Spiriva Continue Albuterol Referral to [facility] for assistance

## 2021-08-19 NOTE — Assessment & Plan Note (Signed)
Trazadone refilled today

## 2021-08-20 ENCOUNTER — Telehealth: Payer: Self-pay

## 2021-08-20 NOTE — Chronic Care Management (AMB) (Signed)
  Chronic Care Management   Note  08/20/2021 Name: EVELIN CAKE MRN: [id number] DOB: 1941/03/02  TRISH MANCINELLI is a 80 y.o. year old female who is a primary care patient of Jearld Fenton, NP. I reached out to [name] by phone today in response to a referral sent by Ms. Junie Bame Hoffer's PCP.  Ms. Hancox was given information about Chronic Care Management services today including:  CCM service includes personalized support from designated clinical staff supervised by her physician, including individualized plan of care and coordination with other care providers 24/7 contact phone numbers for assistance for urgent and routine care needs. Service will only be billed when office clinical staff spend 20 minutes or more in a month to coordinate care. Only one practitioner may furnish and bill the service in a calendar month. The patient may stop CCM services at any time (effective at the end of the month) by phone call to the office staff. The patient is responsible for co-pay (up to 20% after annual deductible is met) if co-pay is required by the individual health plan.   Patient agreed to services and verbal consent obtained.   Follow up plan: Telephone appointment with care management team member scheduled for:08/27/2021  Noreene Larsson, [address], [place], [place] [address] Direct Dial: [phone] [email or web address]

## 2021-08-27 ENCOUNTER — Ambulatory Visit: Payer: Medicare Other | Admitting: Pharmacist

## 2021-08-27 ENCOUNTER — Other Ambulatory Visit: Payer: Self-pay | Admitting: Family Medicine

## 2021-08-27 DIAGNOSIS — J410 Simple chronic bronchitis: Secondary | ICD-10-CM

## 2021-08-27 DIAGNOSIS — E039 Hypothyroidism, unspecified: Secondary | ICD-10-CM

## 2021-08-27 MED ORDER — SPIRIVA RESPIMAT 2.5 MCG/ACT IN AERS: 2.0000 | INHALATION_SPRAY | Freq: Every day | RESPIRATORY_TRACT | 3 refills | Status: DC

## 2021-08-27 NOTE — Patient Instructions (Signed)
Visit Information   Thank you for taking time to visit with me today. Please don't hesitate to contact me if I can be of assistance to you before our next scheduled telephone appointment.  Following are the goals we discussed today:   Please watch the mail for an envelope from [employer] containing the patient assistance program application. Please complete this application and mail back to Brooke King along with the documents we discussed.  Feel free to call me with any questions or concerns. I look forward to our next call!  Brooke King, PharmD, Brooke King, CPP Clinical Pharmacist [facility] [phone]  Our next appointment is by telephone on 09/29/2021 at 2:45 PM  Please call the care guide team at [phone] if you need to cancel or reschedule your appointment.    Following is a copy of your full care plan:  Care Plan : PharmD - Medication Assistance  Updates made by Brooke King, Brooke King since 08/27/2021 12:00 AM     Problem: Disease Progression      Long-Range Goal: Disease Progression Prevented or Minimized   Start Date: 08/27/2021  Expected End Date: 11/25/2021  This Visit's Progress: On track  Priority: High  Note:   Current Barriers:  Unable to independently afford treatment regimen Reports cost of Spiriva Handihaler is currently unaffordable as she is currently in the coverage gap of her [employer] Advantage plan Patient has Partial Extra Help  Pharmacist Clinical Goal(s):  patient will verbalize ability to afford treatment regimen through collaboration with PharmD and provider.    Interventions: 1:1 collaboration with Brooke Fenton, Brooke King regarding development and update of comprehensive plan of care as evidenced by provider attestation and co-signature Inter-disciplinary care team collaboration (see longitudinal plan of care) Perform chart review. Patient seen for  Office Visit with PCP on 11/29 for c/o nose bleed Today reports has follow up appointment with ENT in January. Will discuss nose bleeds and CPAP settings Comprehensive medication review performed; medication list updated in electronic medical record Caution patient for risk of dizziness/sedation with gabapentin, hydroxyzine and trazodone, particularly when taken together Denies dizziness/sleepiness with gabapentin Reports not using hydroxyzine Uses trazodone at bedtime only as needed  COPD: Current treatment: Albuterol inhaler - 1-2 puffs into lungs every 6 hours as needed Not currently using Spiriva inhaler as unaffordable Reports has noticed greater shortness of breath since ran out of Spiriva See below for medication assistance plan  Medication Assistance: Reports cost of Spiriva Handihaler is currently unaffordable as she is currently in the coverage gap of her [place] having Partial Extra Help Will collaborate with Ellijay King for aid to patient with applying for Spiriva patient assistance from [place] patient will need to include a copy of letter from [employer] showing that she has partial extra help and to obtain a print out from her pharmacy showing the cost of the medication Assist patient with obtaining coupon for 30-day supply of Spiriva Respimat from manufacturer website Patient agrees to submit information for coupon Patient understands only Spiriva Respimat, rather than Spiriva Handihaler, available through this program Patient to ask Pharmacist for teaching on administration technique for Respimat device when picks up from pharmacy Will send patient link to "how to use" video from manufacturer website Collaborate with PCP/Brooke King to request provider send Rx for Spiriva Respimat to pharmacy for patient Follow up with Brooke King to provide  coupon details for Spiriva Respimat claim.  Brooke King confirms Spiriva Respimat claim goes through for $0 copayment Follow up with patient to let her know  Hypothyroidism: Controlled; current treatment: Synthroid 112 mcg daily before breakfast Counsel patient on importance of consistently taking Synthroid each morning on an empty stomach, at least 30 minutes before food Counsel on importance of separating the doses of the thyroid product and the oral calcium supplement by at least 4 hours   Patient Goals/Self-Care Activities patient will:  - collaborate with provider on medication access solutions       Consent to CCM Services: Brooke King was given information about Chronic Care Management services including:  CCM service includes personalized support from designated clinical staff supervised by her physician, including individualized plan of care and coordination with other care providers 24/7 contact phone numbers for assistance for urgent and routine care needs. Service will only be billed when office clinical staff spend 20 minutes or more in a month to coordinate care. Only one practitioner may furnish and bill the service in a calendar month. The patient may stop CCM services at any time (effective at the end of the month) by phone call to the office staff. The patient will be responsible for cost sharing (co-pay) of up to 20% of the service fee (after annual deductible is met).  Patient agreed to services and verbal consent obtained.   The patient verbalized understanding of instructions, educational materials, and care plan provided today and declined offer to receive copy of patient instructions, educational materials, and care plan.

## 2021-08-27 NOTE — Chronic Care Management (AMB) (Signed)
Chronic Care Management CCM Pharmacy Note  08/27/2021 Name:  Brooke King MRN:  [id number] DOB:  10/27/1940   Subjective: Brooke King is an 80 y.o. year old female who is a primary patient of Brooke Fenton, King.  The CCM team was consulted for assistance with disease management and care coordination needs.    Engaged with patient by telephone for initial visit for pharmacy case management and/or care coordination services.   Objective:  Medications Reviewed Today     Reviewed by Brooke King, RPH-CPP (Pharmacist) on 08/27/21 at 25  Med List Status: <None>   Medication Order Taking? Sig Documenting Provider Last Dose Status Informant  acetaminophen (TYLENOL) 500 MG tablet [id number] Yes Take 500 mg by mouth every 6 (six) hours as needed. [provider] Taking Active   albuterol (VENTOLIN HFA) 108 (90 Base) MCG/ACT inhaler [id number] Yes USE 1 TO 2 INHALATIONS BY  MOUTH INTO THE LUNGS EVERY  6 HOURS AS NEEDED FOR  WHEEZING OR SHORTNESS OF  BREATH Brooke Fenton, King Taking Active   aspirin EC 81 MG tablet [id number] Yes Take 81 mg by mouth daily. [provider] Taking Active   atorvastatin (LIPITOR) 10 MG tablet [id number] Yes Take 1 tablet (10 mg total) by mouth daily. Brooke Fenton, King Taking Active   Calcium Carb-Cholecalciferol (CALCIUM + D3) 600-200 MG-UNIT TABS [id number] Yes Take by mouth. [provider] Taking Active   fexofenadine (ALLEGRA) 180 MG tablet [id number] Yes Take 180 mg by mouth daily. [provider] Taking Active   gabapentin (NEURONTIN) 300 MG capsule [id number] Yes TAKE 1 CAPSULE BY MOUTH 3  TIMES DAILY  Patient taking differently: Take 300 mg by mouth 2 (two) times daily as needed.   Brooke Fenton, King Taking Active   gentamicin ointment (GARAMYCIN) 0.1 % [id number]  Apply topically. [provider]  Active   hydrOXYzine (ATARAX/VISTARIL) 10 MG tablet [id number] No Take 1 tablet (10 mg total) by mouth daily as  needed.  Patient not taking: Reported on 08/27/2021   Brooke Fenton, King Not Taking Active   latanoprost (XALATAN) 0.005 % ophthalmic solution [id number] Yes Place 1 drop into both eyes at bedtime. [provider] Taking Active   montelukast (SINGULAIR) 10 MG tablet [id number] Yes Take 10 mg by mouth at bedtime. [provider] Taking Active   omeprazole (PRILOSEC) 20 MG capsule [id number] Yes Take 1 capsule by mouth daily. [provider] Taking Active   SYNTHROID 112 MCG tablet [id number] Yes TAKE 1 TABLET BY MOUTH  DAILY BEFORE BREAKFAST King, Brooke Keens, King Taking Active   tiotropium (SPIRIVA HANDIHALER) 18 MCG inhalation capsule [id number] No Place 1 capsule (18 mcg total) into inhaler and inhale daily.  Patient not taking: Reported on 08/19/2021   Brooke King Not Taking Active   traZODone (DESYREL) 50 MG tablet [id number] Yes Take 0.5 tablets (25 mg total) by mouth at bedtime as needed for sleep. Brooke Fenton, King Taking Active   valsartan (DIOVAN) 80 MG tablet [id number] Yes Take 80 mg by mouth daily. [provider] Taking Active   vitamin B-12 (CYANOCOBALAMIN) 1000 MCG tablet [id number] Yes Take 1,000 mcg by mouth daily. [provider] Taking Active             Pertinent Labs:   Lab Results  Component Value Date   CREATININE 0.79 10/29/2020   BUN 13 10/29/2020   NA 142 10/29/2020   K 4.4 10/29/2020   CL  107 10/29/2020   CO2 27 10/29/2020    SDOH:  (Social Determinants of Health) assessments and interventions performed:    CCM Care Plan  Review of patient past medical history, allergies, medications, health status, including review of consultants reports, laboratory and other test data, was performed as part of comprehensive evaluation and provision of chronic care management services.   Care Plan : PharmD - Medication Assistance  Updates made by Brooke King, RPH-CPP since 08/27/2021 12:00 AM     Problem: Disease  Progression      Long-Range Goal: Disease Progression Prevented or Minimized   Start Date: 08/27/2021  Expected End Date: 11/25/2021  This Visit's Progress: On track  Priority: High  Note:   Current Barriers:  Unable to independently afford treatment regimen Reports cost of Spiriva Handihaler is currently unaffordable as she is currently in the coverage gap of her [employer] Advantage plan Patient has Partial Extra Help  Pharmacist Clinical Goal(s):  patient will verbalize ability to afford treatment regimen through collaboration with PharmD and provider.    Interventions: 1:1 collaboration with Brooke Fenton, King regarding development and update of comprehensive plan of care as evidenced by provider attestation and co-signature Inter-disciplinary care team collaboration (see longitudinal plan of care) Perform chart review. Patient seen for Office Visit with PCP on 11/29 for c/o nose bleed Today reports has follow up appointment with ENT in January. Will discuss nose bleeds and CPAP settings Comprehensive medication review performed; medication list updated in electronic medical record Caution patient for risk of dizziness/sedation with gabapentin, hydroxyzine and trazodone, particularly when taken together Denies dizziness/sleepiness with gabapentin Reports not using hydroxyzine Uses trazodone at bedtime only as needed  COPD: Current treatment: Albuterol inhaler - 1-2 puffs into lungs every 6 hours as needed Not currently using Spiriva inhaler as unaffordable Reports has noticed greater shortness of breath since ran out of Spiriva See below for medication assistance plan  Medication Assistance: Reports cost of Spiriva Handihaler is currently unaffordable as she is currently in the coverage gap of her [place] having Partial Extra Help Will collaborate with [place] for aid to patient with applying for  Spiriva patient assistance from [place] patient will need to include a copy of letter from [employer] showing that she has partial extra help and to obtain a print out from her pharmacy showing the cost of the medication Assist patient with obtaining coupon for 30-day supply of Spiriva Respimat from manufacturer website Patient agrees to submit information for coupon Patient understands only Spiriva Respimat, rather than Spiriva Handihaler, available through this program Patient to ask Pharmacist for teaching on administration technique for Respimat device when picks up from pharmacy Will send patient link to "how to use" video from manufacturer website Collaborate with PCP/Dr. Parks Ranger to request provider send Rx for Spiriva Respimat to pharmacy for patient Follow up with [facility] to provide coupon details for Spiriva Respimat claim. RPh Mickel Baas confirms Spiriva Respimat claim goes through for $0 copayment Follow up with patient to let her know  Hypothyroidism: Controlled; current treatment: Synthroid 112 mcg daily before breakfast Counsel patient on importance of consistently taking Synthroid each morning on an empty stomach, at least 30 minutes before food Counsel on importance of separating the doses of the thyroid product and the oral calcium supplement by at least 4 hours   Patient Goals/Self-Care Activities patient will:  - collaborate with provider on medication access  solutions        Plan: Telephone follow up appointment with care management team member scheduled for:  09/29/2021 2:45 PM  Wallace Cullens, PharmD, BCACP, CPP Clinical Pharmacist [place] [phone]

## 2021-08-29 ENCOUNTER — Telehealth: Payer: Self-pay | Admitting: Pharmacy Technician

## 2021-08-29 DIAGNOSIS — Z596 Low income: Secondary | ICD-10-CM

## 2021-08-29 NOTE — Progress Notes (Addendum)
[facility])                                            [place] Team    08/29/2021  ADRIE PICKING 1940-11-10 [phone]                                      Medication Assistance Referral  Referral From: [facility] Embedded RPh Dorthula Perfect   Medication/Company: [employer] / [employer] Patient application portion:  Mailed Provider application portion: Faxed  to [place], [place] Provider address/fax verified via: Office website   [employer]. Chinenye Katzenberger, [place]  [phone]

## 2021-09-04 ENCOUNTER — Other Ambulatory Visit: Payer: Self-pay

## 2021-09-04 ENCOUNTER — Encounter: Payer: Self-pay | Admitting: Internal Medicine

## 2021-09-04 ENCOUNTER — Ambulatory Visit (INDEPENDENT_AMBULATORY_CARE_PROVIDER_SITE_OTHER): Payer: Medicare Other | Admitting: Internal Medicine

## 2021-09-04 VITALS — BP 125/47 | HR 72 | Temp 97.5°F | Resp 17 | Ht 63.0 in | Wt 180.0 lb

## 2021-09-04 DIAGNOSIS — Z6829 Body mass index (BMI) 29.0-29.9, adult: Secondary | ICD-10-CM | POA: Insufficient documentation

## 2021-09-04 DIAGNOSIS — E1142 Type 2 diabetes mellitus with diabetic polyneuropathy: Secondary | ICD-10-CM | POA: Diagnosis not present

## 2021-09-04 DIAGNOSIS — E6609 Other obesity due to excess calories: Secondary | ICD-10-CM | POA: Diagnosis not present

## 2021-09-04 DIAGNOSIS — E039 Hypothyroidism, unspecified: Secondary | ICD-10-CM

## 2021-09-04 DIAGNOSIS — R3915 Urgency of urination: Secondary | ICD-10-CM | POA: Diagnosis not present

## 2021-09-04 DIAGNOSIS — Z23 Encounter for immunization: Secondary | ICD-10-CM | POA: Diagnosis not present

## 2021-09-04 DIAGNOSIS — Z6831 Body mass index (BMI) 31.0-31.9, adult: Secondary | ICD-10-CM

## 2021-09-04 DIAGNOSIS — R350 Frequency of micturition: Secondary | ICD-10-CM

## 2021-09-04 DIAGNOSIS — Z0001 Encounter for general adult medical examination with abnormal findings: Secondary | ICD-10-CM | POA: Diagnosis not present

## 2021-09-04 LAB — POCT URINALYSIS DIPSTICK
Bilirubin, UA: NEGATIVE
Blood, UA: NEGATIVE
Glucose, UA: NEGATIVE
Ketones, UA: NEGATIVE
Leukocytes, UA: NEGATIVE
Nitrite, UA: NEGATIVE
Protein, UA: NEGATIVE
Spec Grav, UA: 1.01 (ref 1.010–1.025)
Urobilinogen, UA: 0.2 E.U./dL
pH, UA: 5 (ref 5.0–8.0)

## 2021-09-04 NOTE — Patient Instructions (Signed)
Health Maintenance for Postmenopausal Women ?Menopause is a normal process in which your ability to get pregnant comes to an end. This process happens slowly over many months or years, usually between the ages of 48 and 55. Menopause is complete when you have missed your menstrual period for 12 months. ?It is important to talk with your health care provider about some of the most common conditions that affect women after menopause (postmenopausal women). These include heart disease, cancer, and bone loss (osteoporosis). Adopting a healthy lifestyle and getting preventive care can help to promote your health and wellness. The actions you take can also lower your chances of developing some of these common conditions. ?What are the signs and symptoms of menopause? ?During menopause, you may have the following symptoms: ?Hot flashes. These can be moderate or severe. ?Night sweats. ?Decrease in sex drive. ?Mood swings. ?Headaches. ?Tiredness (fatigue). ?Irritability. ?Memory problems. ?Problems falling asleep or staying asleep. ?Talk with your health care provider about treatment options for your symptoms. ?Do I need hormone replacement therapy? ?Hormone replacement therapy is effective in treating symptoms that are caused by menopause, such as hot flashes and night sweats. ?Hormone replacement carries certain risks, especially as you become older. If you are thinking about using estrogen or estrogen with progestin, discuss the benefits and risks with your health care provider. ?How can I reduce my risk for heart disease and stroke? ?The risk of heart disease, heart attack, and stroke increases as you age. One of the causes may be a change in the body's hormones during menopause. This can affect how your body uses dietary fats, triglycerides, and cholesterol. Heart attack and stroke are medical emergencies. There are many things that you can do to help prevent heart disease and stroke. ?Watch your blood pressure ?High  blood pressure causes heart disease and increases the risk of stroke. This is more likely to develop in people who have high blood pressure readings or are overweight. ?Have your blood pressure checked: ?Every 3-5 years if you are 18-39 years of age. ?Every year if you are 40 years old or older. ?Eat a healthy diet ? ?Eat a diet that includes plenty of vegetables, fruits, low-fat dairy products, and lean protein. ?Do not eat a lot of foods that are high in solid fats, added sugars, or sodium. ?Get regular exercise ?Get regular exercise. This is one of the most important things you can do for your health. Most adults should: ?Try to exercise for at least 150 minutes each week. The exercise should increase your heart rate and make you sweat (moderate-intensity exercise). ?Try to do strengthening exercises at least twice each week. Do these in addition to the moderate-intensity exercise. ?Spend less time sitting. Even light physical activity can be beneficial. ?Other tips ?Work with your health care provider to achieve or maintain a healthy weight. ?Do not use any products that contain nicotine or tobacco. These products include cigarettes, chewing tobacco, and vaping devices, such as e-cigarettes. If you need help quitting, ask your health care provider. ?Know your numbers. Ask your health care provider to check your cholesterol and your blood sugar (glucose). Continue to have your blood tested as directed by your health care provider. ?Do I need screening for cancer? ?Depending on your health history and family history, you may need to have cancer screenings at different stages of your life. This may include screening for: ?Breast cancer. ?Cervical cancer. ?Lung cancer. ?Colorectal cancer. ?What is my risk for osteoporosis? ?After menopause, you may be   at increased risk for osteoporosis. Osteoporosis is a condition in which bone destruction happens more quickly than new bone creation. To help prevent osteoporosis or  the bone fractures that can happen because of osteoporosis, you may take the following actions: ?If you are 19-50 years old, get at least 1,000 mg of calcium and at least 600 international units (IU) of vitamin D per day. ?If you are older than age 50 but younger than age 70, get at least 1,200 mg of calcium and at least 600 international units (IU) of vitamin D per day. ?If you are older than age 70, get at least 1,200 mg of calcium and at least 800 international units (IU) of vitamin D per day. ?Smoking and drinking excessive alcohol increase the risk of osteoporosis. Eat foods that are rich in calcium and vitamin D, and do weight-bearing exercises several times each week as directed by your health care provider. ?How does menopause affect my mental health? ?Depression may occur at any age, but it is more common as you become older. Common symptoms of depression include: ?Feeling depressed. ?Changes in sleep patterns. ?Changes in appetite or eating patterns. ?Feeling an overall lack of motivation or enjoyment of activities that you previously enjoyed. ?Frequent crying spells. ?Talk with your health care provider if you think that you are experiencing any of these symptoms. ?General instructions ?See your health care provider for regular wellness exams and vaccines. This may include: ?Scheduling regular health, dental, and eye exams. ?Getting and maintaining your vaccines. These include: ?Influenza vaccine. Get this vaccine each year before the flu season begins. ?Pneumonia vaccine. ?Shingles vaccine. ?Tetanus, diphtheria, and pertussis (Tdap) booster vaccine. ?Your health care provider may also recommend other immunizations. ?Tell your health care provider if you have ever been abused or do not feel safe at home. ?Summary ?Menopause is a normal process in which your ability to get pregnant comes to an end. ?This condition causes hot flashes, night sweats, decreased interest in sex, mood swings, headaches, or lack  of sleep. ?Treatment for this condition may include hormone replacement therapy. ?Take actions to keep yourself healthy, including exercising regularly, eating a healthy diet, watching your weight, and checking your blood pressure and blood sugar levels. ?Get screened for cancer and depression. Make sure that you are up to date with all your vaccines. ?This information is not intended to replace advice given to you by your health care provider. Make sure you discuss any questions you have with your health care provider. ?Document Revised: 01/27/2021 Document Reviewed: 01/27/2021 ?Elsevier Patient Education ? 2022 Elsevier Inc. ? ?

## 2021-09-04 NOTE — Progress Notes (Signed)
Subjective:    Patient ID: Brooke King, female    DOB: 04-17-1941, 80 y.o.   MRN: [id number]  HPI  Patient presents to clinic today for her annual exam.  Flu: 05/2021 Tetanus: > 10 years ago COVID: never Pneumovax: 05/2014 Prevnar: 11/2015 Shingrix: never Pap smear: No longer screening Mammogram: 08/2018 Bone density: No longer screening Colon screening: 10/2015 Vision screening: annually Dentist: as needed  Diet: She does eat meat. She consumes more veggies than fruits. She does eat fried foods. She drinks mostly coffee, soda, water Exercise: None  Review of Systems  Past Medical History:  Diagnosis Date   Allergy    B12 deficiency    Chest pain syndrome    COPD (chronic obstructive pulmonary disease) (HCC)    Depression    GERD (gastroesophageal reflux disease)    Glaucoma    History of colonic polyps    History of degenerative disc disease    c-spine, status post cervical laminectomy   Hyperlipidemia    Hypertension    Hypothyroidism    Insomnia    Migraine    Restless leg syndrome    Seasonal allergies    Sleep apnea    Thyroid cancer (HCC)    Urinary incontinence     Current Outpatient Medications  Medication Sig Dispense Refill   acetaminophen (TYLENOL) 500 MG tablet Take 500 mg by mouth every 6 (six) hours as needed.     albuterol (VENTOLIN HFA) 108 (90 Base) MCG/ACT inhaler USE 1 TO 2 INHALATIONS BY  MOUTH INTO THE LUNGS EVERY  6 HOURS AS NEEDED FOR  WHEEZING OR SHORTNESS OF  BREATH 8 g 1   aspirin EC 81 MG tablet Take 81 mg by mouth daily.     atorvastatin (LIPITOR) 10 MG tablet Take 1 tablet (10 mg total) by mouth daily. 90 tablet 3   Calcium Carb-Cholecalciferol (CALCIUM + D3) 600-200 MG-UNIT TABS Take by mouth.     fexofenadine (ALLEGRA) 180 MG tablet Take 180 mg by mouth daily.     gabapentin (NEURONTIN) 300 MG capsule TAKE 1 CAPSULE BY MOUTH 3  TIMES DAILY (Patient taking differently: Take 300 mg by mouth 2 (two) times daily as needed.) 270  capsule 1   gentamicin ointment (GARAMYCIN) 0.1 % Apply topically.     hydrOXYzine (ATARAX/VISTARIL) 10 MG tablet Take 1 tablet (10 mg total) by mouth daily as needed. (Patient not taking: Reported on 08/27/2021) 30 tablet 0   latanoprost (XALATAN) 0.005 % ophthalmic solution Place 1 drop into both eyes at bedtime.     montelukast (SINGULAIR) 10 MG tablet Take 10 mg by mouth at bedtime.     omeprazole (PRILOSEC) 20 MG capsule Take 1 capsule by mouth daily.     SPIRIVA RESPIMAT 2.5 MCG/ACT AERS Inhale 2 puffs into the lungs daily. 4 g 3   SYNTHROID 112 MCG tablet TAKE 1 TABLET BY MOUTH  DAILY BEFORE BREAKFAST 90 tablet 3   traZODone (DESYREL) 50 MG tablet Take 0.5 tablets (25 mg total) by mouth at bedtime as needed for sleep. 45 tablet 0   valsartan (DIOVAN) 80 MG tablet Take 80 mg by mouth daily.     vitamin B-12 (CYANOCOBALAMIN) 1000 MCG tablet Take 1,000 mcg by mouth daily.     No current facility-administered medications for this visit.    Allergies  Allergen Reactions   Toviaz [Fesoterodine Fumarate Er] Other (See Comments)    Throat closed   Cortisone    Doxycycline Hyclate  Hydrocodone    Keflex [Cephalexin]    Septra [Sulfamethoxazole-Trimethoprim]    Tagamet [Cimetidine]    Zoloft [Sertraline]    Penicillins Rash    Family History  Problem Relation Age of Onset   Heart disease Mother    Breast cancer Neg Hx     Social History   Socioeconomic History   Marital status: Widowed    Spouse name: Not on file   Number of children: Not on file   Years of education: Not on file   Highest education level: Not on file  Occupational History   Occupation: Retired  Tobacco Use   Smoking status: Every Day    Packs/day: 1.00    Years: 40.00    Pack years: 40.00    Types: Cigarettes   Smokeless tobacco: Never  Vaping Use   Vaping Use: Never used  Substance and Sexual Activity   Alcohol use: No   Drug use: No   Sexual activity: Not on file  Other Topics Concern   Not  on file  Social History Narrative   Not on file   Social Determinants of Health   Financial Resource Strain: Not on file  Food Insecurity: Not on file  Transportation Needs: Not on file  Physical Activity: Not on file  Stress: Not on file  Social Connections: Not on file  Intimate Partner Violence: Not on file     Constitutional: Denies fever, malaise, fatigue, headache or abrupt weight changes.  HEENT: Denies eye pain, eye redness, ear pain, ringing in the ears, wax buildup, runny nose, nasal congestion, bloody nose, or sore throat. Respiratory: Patient reports chronic cough.  Denies difficulty breathing, shortness of breath.   Cardiovascular: Denies chest pain, chest tightness, palpitations or swelling in the hands or feet.  Gastrointestinal: Denies abdominal pain, bloating, constipation, diarrhea or blood in the stool.  GU: Patient reports urinary frequency and urgency.  Denies pain with urination, burning sensation, blood in urine, odor or discharge. Musculoskeletal: Denies decrease in range of motion, difficulty with gait, muscle pain or joint pain and swelling.  Skin: Denies redness, rashes, lesions or ulcercations.  Neurological: Patient reports insomnia.  Denies dizziness, difficulty with memory, difficulty with speech or problems with balance and coordination.  Psych: Patient has a history of anxiety.  Denies depression, SI/HI.  No other specific complaints in a complete review of systems (except as listed in HPI above).     Objective:   Physical Exam  BP (!) 125/47 (BP Location: Left Arm, Patient Position: Sitting, Cuff Size: Large)    Pulse 72    Temp (!) 97.5 F (36.4 C) (Temporal)    Resp 17    Ht 5' 3"  (1.6 m)    Wt 180 lb (81.6 kg)    SpO2 100%    BMI 31.89 kg/m   Wt Readings from Last 3 Encounters:  08/19/21 179 lb 6.4 oz (81.4 kg)  06/10/21 181 lb 3.2 oz (82.2 kg)  02/25/21 178 lb (80.7 kg)    General: Appears her stated age, obese, in NAD. Skin: Warm, dry  and intact. No ulcerations noted. HEENT: Head: normal shape and size; Eyes: sclera Strohman and EOMs intact;  Neck:  Neck supple, trachea midline. No masses, lumps or thyromegaly present.  Cardiovascular: Normal rate and rhythm. S1,S2 noted.  No murmur, rubs or gallops noted. No JVD or BLE edema. No carotid bruits noted. Pulmonary/Chest: Normal effort and positive vesicular breath sounds. No respiratory distress. No wheezes, rales or ronchi noted.  Abdomen:  Soft and nontender. Normal bowel sounds.  Musculoskeletal: Strength 5/5 BUE/BLE.  No difficulty with gait.  Neurological: Alert and oriented. Cranial nerves II-XII grossly intact. Coordination normal.  Psychiatric: Mood and affect normal. Behavior is normal. Judgment and thought content normal.    BMET    Component Value Date/Time   NA 142 10/29/2020 1125   K 4.4 10/29/2020 1125   K 3.8 12/15/2013 1202   CL 107 10/29/2020 1125   CO2 27 10/29/2020 1125   GLUCOSE 124 (H) 10/29/2020 1125   BUN 13 10/29/2020 1125   CREATININE 0.79 10/29/2020 1125   CALCIUM 9.8 10/29/2020 1125   GFRNONAA >60 01/19/2012 0949   GFRAA >60 01/19/2012 0949    Lipid Panel  No results found for: CHOL, TRIG, HDL, CHOLHDL, VLDL, LDLCALC  CBC    Component Value Date/Time   WBC 8.5 08/19/2021 1330   RBC 4.68 08/19/2021 1330   HGB 14.2 08/19/2021 1330   HCT 42.9 08/19/2021 1330   PLT 150 08/19/2021 1330   MCV 91.7 08/19/2021 1330   MCH 30.3 08/19/2021 1330   MCHC 33.1 08/19/2021 1330   RDW 12.6 08/19/2021 1330   LYMPHSABS 1,976 10/29/2020 1125   EOSABS 397 10/29/2020 1125   BASOSABS 89 10/29/2020 1125    Hgb A1C Lab Results  Component Value Date   HGBA1C 6.4 (H) 02/25/2021           Assessment & Plan:   Preventative Health Maintenance:  Flu shot UTD She declines tetanus for financial reasons.  Advised her if she gets bit or cut she will need to go get this done Pneumovax and Prevnar UTD Discussed Shingrix vaccine, she would like to get  this she will need to get it at the pharmacy Encouraged her to get a COVID-vaccine She no longer wants to screen for cervical, breast, colon cancer or osteoporosis Encouraged her to consume a balanced diet and exercise regimen Advised her to see an eye doctor and dentist annually We will check CBC, c-Met, TSH, free T4, lipid, A1c and urine microalbumin today  Urinary Frequency, Urgency:  Urinalysis normal Likely r/t stress incontinence  RTC in 6 months, follow-up chronic conditions Webb Silversmith, NP This visit occurred during the SARS-CoV-2 public health emergency.  Safety protocols were in place, including screening questions prior to the visit, additional usage of staff PPE, and extensive cleaning of exam room while observing appropriate contact time as indicated for disinfecting solutions.

## 2021-09-04 NOTE — Assessment & Plan Note (Signed)
Encouraged diet and exercise for weight loss ?

## 2021-09-05 LAB — CBC
HCT: 43.4 % (ref 35.0–45.0)
Hemoglobin: 14.4 g/dL (ref 11.7–15.5)
MCH: 30.6 pg (ref 27.0–33.0)
MCHC: 33.2 g/dL (ref 32.0–36.0)
MCV: 92.3 fL (ref 80.0–100.0)
MPV: 13.5 fL — ABNORMAL HIGH (ref 7.5–12.5)
Platelets: 135 10*3/uL — ABNORMAL LOW (ref 140–400)
RBC: 4.7 10*6/uL (ref 3.80–5.10)
RDW: 12.6 % (ref 11.0–15.0)
WBC: 6.6 10*3/uL (ref 3.8–10.8)

## 2021-09-05 LAB — T4, FREE: Free T4: 1.3 ng/dL (ref 0.8–1.8)

## 2021-09-05 LAB — COMPLETE METABOLIC PANEL WITH GFR
AG Ratio: 1.7 (calc) (ref 1.0–2.5)
ALT: 19 U/L (ref 6–29)
AST: 21 U/L (ref 10–35)
Albumin: 4.3 g/dL (ref 3.6–5.1)
Alkaline phosphatase (APISO): 75 U/L (ref 37–153)
BUN: 14 mg/dL (ref 7–25)
CO2: 28 mmol/L (ref 20–32)
Calcium: 9.7 mg/dL (ref 8.6–10.4)
Chloride: 104 mmol/L (ref 98–110)
Creat: 0.86 mg/dL (ref 0.60–0.95)
Globulin: 2.5 g/dL (calc) (ref 1.9–3.7)
Glucose, Bld: 129 mg/dL (ref 65–139)
Potassium: 4.4 mmol/L (ref 3.5–5.3)
Sodium: 141 mmol/L (ref 135–146)
Total Bilirubin: 0.6 mg/dL (ref 0.2–1.2)
Total Protein: 6.8 g/dL (ref 6.1–8.1)
eGFR: 68 mL/min/{1.73_m2} (ref >=60)

## 2021-09-05 LAB — HEMOGLOBIN A1C
Hgb A1c MFr Bld: 6.4 % of total Hgb — ABNORMAL HIGH (ref <5.7)
Mean Plasma Glucose: 137 mg/dL
eAG (mmol/L): 7.6 mmol/L

## 2021-09-05 LAB — MICROALBUMIN / CREATININE URINE RATIO
Creatinine, Urine: 18 mg/dL — ABNORMAL LOW (ref 20–275)
Microalb Creat Ratio: 22 mcg/mg creat (ref <30)
Microalb, Ur: 0.4 mg/dL

## 2021-09-05 LAB — LIPID PANEL
Cholesterol: 157 mg/dL (ref <200)
HDL: 53 mg/dL (ref >=50)
LDL Cholesterol (Calc): 85 mg/dL (calc)
Non-HDL Cholesterol (Calc): 104 mg/dL (calc) (ref <130)
Total CHOL/HDL Ratio: 3 (calc) (ref <5.0)
Triglycerides: 101 mg/dL (ref <150)

## 2021-09-05 LAB — TSH: TSH: 1.64 mIU/L (ref 0.40–4.50)

## 2021-09-18 ENCOUNTER — Ambulatory Visit: Payer: Self-pay

## 2021-09-18 NOTE — Telephone Encounter (Signed)
Chief Complaint: Should get tooth pulled with low platelets Symptoms: None Frequency: Off aspirin x 2 weeks, will call the dentist to schedule if it's ok Pertinent Negatives: Patient denies any symptoms or issues with bleeding Disposition: [] ED /[] Urgent Care (no appt availability in office) / [] Appointment(In office/virtual)/ []  [facility]/ [x] Home Care/ [] Refused Recommended Disposition  Additional Notes: Advised someone will call back with Brooke King's recommendation.    Summary: platelets are low/tooth pulled   Pt stated PCP Brooke King, advised her platelets are low and she shouldn't take Aspirin .Pt stated she has been off Aspirin for two weeks  Pt has an appointment tomorrow to get a tooth pulled  and would like to know if its going to be safe to get the tooth pulled if her platelets are low.   Seeking clinical advise.      Reason for Disposition  [1] Caller requesting NON-URGENT health information AND [2] PCP's office is the best resource  Answer Assessment - Initial Assessment Questions 1. REASON FOR CALL or QUESTION: "What is your reason for calling today?" or "How can I best help you?" or "What question do you have that I can help answer?"     Should I get tooth pulled with low platelets  Protocols used: Information Only Call - No Triage-A-AH

## 2021-09-18 NOTE — Telephone Encounter (Signed)
I called the patient and gave her Brooke King recommendation that her platelet is almost within normal range at 135 (140- 400) as long as she stopped ASA she should be fine to get her tooth pulled. She informed me that she cancelled the dentist appt for tomorrow, but confirmed that she have been off the ASA for 2 weeks.

## 2021-09-23 DIAGNOSIS — G4733 Obstructive sleep apnea (adult) (pediatric): Secondary | ICD-10-CM | POA: Diagnosis not present

## 2021-09-24 ENCOUNTER — Telehealth: Payer: Self-pay | Admitting: Pharmacy Technician

## 2021-09-24 DIAGNOSIS — Z596 Low income: Secondary | ICD-10-CM

## 2021-09-24 NOTE — Progress Notes (Addendum)
[facility])                                            [place] Team    09/24/2021  Brooke King [birth year] [id number]  Received both patient and provider portion(s) of patient assistance application(s) for Spiriva. Faxed completed application and required documents into BI.  [name], [place]  [phone]

## 2021-09-29 ENCOUNTER — Ambulatory Visit (INDEPENDENT_AMBULATORY_CARE_PROVIDER_SITE_OTHER): Payer: Medicare Other | Admitting: Pharmacist

## 2021-09-29 DIAGNOSIS — J410 Simple chronic bronchitis: Secondary | ICD-10-CM

## 2021-09-29 DIAGNOSIS — E039 Hypothyroidism, unspecified: Secondary | ICD-10-CM

## 2021-09-29 NOTE — Chronic Care Management (AMB) (Signed)
Chronic Care Management CCM Pharmacy Note  09/29/2021 Name:  Brooke King MRN:  [id number] DOB:  14-Aug-1941   Subjective: Brooke King is an 81 y.o. year old female who is a primary patient of Jearld Fenton, NP.  The CCM team was consulted for assistance with disease management and care coordination needs.    Engaged with patient by telephone for follow up visit for pharmacy case management and/or care coordination services.   Objective:  Medications Reviewed Today     Reviewed by Jearld Fenton, NP (Nurse Practitioner) on 09/04/21 at [place] [facility] Status: <None>   Medication Order Taking? Sig Documenting Provider Last Dose Status Informant  acetaminophen (TYLENOL) 500 MG tablet [id number] Yes Take 500 mg by mouth every 6 (six) hours as needed. [provider] Taking Active   albuterol (VENTOLIN HFA) 108 (90 Base) MCG/ACT inhaler [id number] Yes USE 1 TO 2 INHALATIONS BY  MOUTH INTO THE LUNGS EVERY  6 HOURS AS NEEDED FOR  WHEEZING OR SHORTNESS OF  BREATH Jearld Fenton, NP Taking Active   aspirin EC 81 MG tablet [id number] Yes Take 81 mg by mouth daily. [provider] Taking Active   atorvastatin (LIPITOR) 10 MG tablet [id number] Yes Take 1 tablet (10 mg total) by mouth daily. Jearld Fenton, NP Taking Active   Calcium Carb-Cholecalciferol (CALCIUM + D3) 600-200 MG-UNIT TABS [id number] Yes Take by mouth. [provider] Taking Active   Cranberry 180 MG CAPS [id number] Yes Take by mouth. [provider] Taking Active   fexofenadine (ALLEGRA) 180 MG tablet [id number] Yes Take 180 mg by mouth daily. [provider] Taking Active   gabapentin (NEURONTIN) 300 MG capsule [id number] Yes TAKE 1 CAPSULE BY MOUTH 3  TIMES DAILY  Patient taking differently: Take 300 mg by mouth 2 (two) times daily as needed.   Jearld Fenton, NP Taking Active   gentamicin ointment (GARAMYCIN) 0.1 % [id number] Yes Apply topically. [provider] Taking Active    hydrOXYzine (ATARAX/VISTARIL) 10 MG tablet [id number] Yes Take 1 tablet (10 mg total) by mouth daily as needed. Jearld Fenton, NP Taking Active   latanoprost (XALATAN) 0.005 % ophthalmic solution [id number] Yes Place 1 drop into both eyes at bedtime. [provider] Taking Active   montelukast (SINGULAIR) 10 MG tablet [id number] Yes Take 10 mg by mouth at bedtime. [provider] Taking Active   omeprazole (PRILOSEC) 20 MG capsule [id number] Yes Take 1 capsule by mouth daily. [provider] Taking Active   SPIRIVA RESPIMAT 2.5 MCG/ACT AERS [id number] Yes Inhale 2 puffs into the lungs daily. Olin Hauser, DO Taking Active   SYNTHROID 112 MCG tablet [id number] Yes TAKE 1 TABLET BY MOUTH  DAILY BEFORE BREAKFAST Baity, Coralie Keens, NP Taking Active   traZODone (DESYREL) 50 MG tablet [id number] Yes Take 0.5 tablets (25 mg total) by mouth at bedtime as needed for sleep. Jearld Fenton, NP Taking Active   valsartan (DIOVAN) 80 MG tablet [id number] Yes Take 80 mg by mouth daily. [provider] Taking Active   vitamin B-12 (CYANOCOBALAMIN) 1000 MCG tablet [id number] Yes Take 1,000 mcg by mouth daily. [provider] Taking Active             Pertinent Labs:    Lab Results  Component Value Date   CREATININE 0.86 09/04/2021   BUN 14 09/04/2021   NA 141 09/04/2021   K 4.4 09/04/2021   CL 104 09/04/2021   CO2 28  09/04/2021   Lab Results  Component Value Date   TSH 1.64 09/04/2021   T4TOTAL 11.0 10/29/2020    SDOH:  (Social Determinants of Health) assessments and interventions performed:    [place]  Review of patient past medical history, allergies, medications, health status, including review of consultants reports, laboratory and other test data, was performed as part of comprehensive evaluation and provision of chronic care management services.   Care Plan : PharmD - Medication Assistance  Updates made by Rennis Petty,  RPH-CPP since 09/29/2021 12:00 AM     Problem: Disease Progression      Long-Range Goal: Disease Progression Prevented or Minimized   Start Date: 08/27/2021  Expected End Date: 11/25/2021  This Visit's Progress: On track  Recent Progress: On track  Priority: High  Note:   Current Barriers:  Unable to independently afford treatment regimen Reports cost of Spiriva Handihaler is unaffordable, particularly when in the coverage gap of her [employer] Advantage plan Patient has Partial Extra Help  Pharmacist Clinical Goal(s):  patient will verbalize ability to afford treatment regimen through collaboration with PharmD and provider.    Interventions: 1:1 collaboration with Jearld Fenton, NP regarding development and update of comprehensive plan of care as evidenced by provider attestation and co-signature Inter-disciplinary care team collaboration (see longitudinal plan of care) Perform chart review.  Patient seen for Office Visit with PCP on 12/15 for annual exam. From review of lab result notes from 12/15, note provider advised patient to stop her aspirin as her platelets were low Today reports follow up appointment with ENT scheduled for 1/27. Note patient planning to discuss nose bleeds and CPAP settings Patient confirms stopped aspirin as directed by PCP Reports had a tooth pulled on Friday and that gum line remains sore, but she is following directions for aftercare from her dentist.  Medication Assistance: Collaborating with Emerson Simcox for aid to patient with applying for Spiriva patient assistance from [place] faxed application to patient assistance program on 09/24/2021  COPD: Current treatment: Albuterol inhaler - 1-2 puffs into lungs every 6 hours as needed Spiriva Respimat - 2 puffs once daily Identify patient in need of a refill of Spiriva.  Patient most recently received Spiriva Respimat as this dosage form was  available through one-time 30-day supply program. However, patient states she prefers Spiriva Surveyor, quantity with [facility] to confirm refill of Spiriva Handihaler remains on file. RPh Melony confirms will refill for patient Patient to pick up 30 day supply of Spiriva Handihaler for tier 3 copayment to last while awaiting response from patient assistance program  Hypothyroidism: Controlled; current treatment: Synthroid 112 mcg daily before breakfast Have counseled patient on importance of consistently taking Synthroid each morning on an empty stomach, at least 30 minutes before food and on importance of separating the doses of the thyroid product and the oral calcium supplement by at least 4 hours  Health Maintenance: Encourage patient to consider obtaining both Tetanus and Shingrix vaccinations. Counsel patient that starting 09/21/2021, these vaccinations are covered with no deductible and no cost-sharing to people with Medicare prescription drug coverage.  Patient states that she will follow up with pharmacy regarding scheduling these vaccinations  Patient Goals/Self-Care Activities patient will:  - collaborate with provider on medication access solutions       Plan: Telephone follow up appointment with care management team member scheduled for:  10/24/2021 at 12 pm  Wallace Cullens, PharmD, [place],  [place] [phone]

## 2021-09-29 NOTE — Patient Instructions (Signed)
Visit Information  Thank you for taking time to visit with me today. Please don't hesitate to contact me if I can be of assistance to you before our next scheduled telephone appointment.  Following are the goals we discussed today:   Goals Addressed             This Visit's Progress    Pharmacy Goals       Please follow up with your pharmacy regarding receiving Tetanus and Shingles Vaccinations   Feel free to call me with any questions or concerns. I look forward to our next call!  Wallace Cullens, PharmD, [place], CPP Clinical Pharmacist [facility] [phone]         Our next appointment is by telephone on 10/24/2021 at 12 pm  Please call the care guide team at [phone] if you need to cancel or reschedule your appointment.    The patient verbalized understanding of instructions, educational materials, and care plan provided today and declined offer to receive copy of patient instructions, educational materials, and care plan.

## 2021-10-07 ENCOUNTER — Other Ambulatory Visit: Payer: Self-pay

## 2021-10-07 ENCOUNTER — Ambulatory Visit
Admission: RE | Admit: 2021-10-07 | Discharge: 2021-10-07 | Disposition: A | Discharge status: Home / Self Care | Payer: Medicare Other | Attending: Internal Medicine | Admitting: Internal Medicine

## 2021-10-07 ENCOUNTER — Ambulatory Visit (INDEPENDENT_AMBULATORY_CARE_PROVIDER_SITE_OTHER): Payer: Medicare Other | Admitting: Internal Medicine

## 2021-10-07 ENCOUNTER — Encounter: Payer: Self-pay | Admitting: Internal Medicine

## 2021-10-07 ENCOUNTER — Ambulatory Visit
Admission: RE | Admit: 2021-10-07 | Discharge: 2021-10-07 | Disposition: A | Discharge status: Home / Self Care | Payer: Medicare Other | Source: Ambulatory Visit | Attending: Internal Medicine | Admitting: Internal Medicine

## 2021-10-07 VITALS — BP 119/64 | HR 86 | Temp 97.1°F | Ht 63.0 in | Wt 181.0 lb

## 2021-10-07 DIAGNOSIS — J029 Acute pharyngitis, unspecified: Secondary | ICD-10-CM | POA: Diagnosis not present

## 2021-10-07 DIAGNOSIS — J410 Simple chronic bronchitis: Secondary | ICD-10-CM

## 2021-10-07 DIAGNOSIS — R0602 Shortness of breath: Secondary | ICD-10-CM | POA: Diagnosis not present

## 2021-10-07 DIAGNOSIS — J449 Chronic obstructive pulmonary disease, unspecified: Secondary | ICD-10-CM | POA: Diagnosis not present

## 2021-10-07 DIAGNOSIS — R059 Cough, unspecified: Secondary | ICD-10-CM | POA: Diagnosis not present

## 2021-10-07 DIAGNOSIS — J020 Streptococcal pharyngitis: Secondary | ICD-10-CM

## 2021-10-07 LAB — POCT RAPID STREP A (OFFICE): Rapid Strep A Screen: POSITIVE — AB

## 2021-10-07 MED ORDER — AZITHROMYCIN 250 MG PO TABS: ORAL_TABLET | ORAL | 0 refills | Status: DC

## 2021-10-07 NOTE — Progress Notes (Signed)
Subjective:    Patient ID: Brooke King, female    DOB: December 10, 1940, 81 y.o.   MRN: [id number]  HPI  Patient presents to clinic today with complaint of runny nose, sore throat, cough and shortness of breath.  She reports this started 1 week ago after getting a tooth pulled.  She is blowing clear mucus out of her nose.  She is having difficulty swallowing.  The cough is mostly nonproductive.  She denies headache, nasal congestion, ear pain, chest pain, nausea, vomiting or diarrhea.  She denies fever, chills or body aches.  She has not had sick contacts that she is aware of.  She has been trying salt water gargles OTC with minimal relief of symptoms.  She does have a history of COPD, currently managed on Spiriva and Albuterol.  She does smoke.  She had a negative home COVID test 3 days ago.  Review of Systems     Past Medical History:  Diagnosis Date   Allergy    B12 deficiency    Chest pain syndrome    COPD (chronic obstructive pulmonary disease) (HCC)    Depression    GERD (gastroesophageal reflux disease)    Glaucoma    History of colonic polyps    History of degenerative disc disease    c-spine, status post cervical laminectomy   Hyperlipidemia    Hypertension    Hypothyroidism    Insomnia    Migraine    Restless leg syndrome    Seasonal allergies    Sleep apnea    Thyroid cancer (HCC)    Urinary incontinence     Current Outpatient Medications  Medication Sig Dispense Refill   acetaminophen (TYLENOL) 500 MG tablet Take 500 mg by mouth every 6 (six) hours as needed.     albuterol (VENTOLIN HFA) 108 (90 Base) MCG/ACT inhaler USE 1 TO 2 INHALATIONS BY  MOUTH INTO THE LUNGS EVERY  6 HOURS AS NEEDED FOR  WHEEZING OR SHORTNESS OF  BREATH 8 g 1   aspirin EC 81 MG tablet Take 81 mg by mouth daily.     atorvastatin (LIPITOR) 10 MG tablet Take 1 tablet (10 mg total) by mouth daily. 90 tablet 3   Calcium Carb-Cholecalciferol (CALCIUM + D3) 600-200 MG-UNIT TABS Take by mouth.      Cranberry 180 MG CAPS Take by mouth.     fexofenadine (ALLEGRA) 180 MG tablet Take 180 mg by mouth daily.     gabapentin (NEURONTIN) 300 MG capsule TAKE 1 CAPSULE BY MOUTH 3  TIMES DAILY (Patient taking differently: Take 300 mg by mouth 2 (two) times daily as needed.) 270 capsule 1   gentamicin ointment (GARAMYCIN) 0.1 % Apply topically.     hydrOXYzine (ATARAX/VISTARIL) 10 MG tablet Take 1 tablet (10 mg total) by mouth daily as needed. 30 tablet 0   latanoprost (XALATAN) 0.005 % ophthalmic solution Place 1 drop into both eyes at bedtime.     montelukast (SINGULAIR) 10 MG tablet Take 10 mg by mouth at bedtime.     omeprazole (PRILOSEC) 20 MG capsule Take 1 capsule by mouth daily.     SYNTHROID 112 MCG tablet TAKE 1 TABLET BY MOUTH  DAILY BEFORE BREAKFAST 90 tablet 3   tiotropium (SPIRIVA) 18 MCG inhalation capsule Place 18 mcg into inhaler and inhale daily.     traZODone (DESYREL) 50 MG tablet Take 0.5 tablets (25 mg total) by mouth at bedtime as needed for sleep. 45 tablet 0   valsartan (DIOVAN) 80  MG tablet Take 80 mg by mouth daily.     vitamin B-12 (CYANOCOBALAMIN) 1000 MCG tablet Take 1,000 mcg by mouth daily.     No current facility-administered medications for this visit.    Allergies  Allergen Reactions   Toviaz [Fesoterodine Fumarate Er] Other (See Comments)    Throat closed   Cortisone    Doxycycline Hyclate    Hydrocodone    Keflex [Cephalexin]    Septra [Sulfamethoxazole-Trimethoprim]    Tagamet [Cimetidine]    Zoloft [Sertraline]    Penicillins Rash    Family History  Problem Relation Age of Onset   Heart disease Mother    Breast cancer Neg Hx     Social History   Socioeconomic History   Marital status: Widowed    Spouse name: Not on file   Number of children: Not on file   Years of education: Not on file   Highest education level: Not on file  Occupational History   Occupation: Retired  Tobacco Use   Smoking status: Every Day    Packs/day: 1.00     Years: 40.00    Pack years: 40.00    Types: Cigarettes   Smokeless tobacco: Never  Vaping Use   Vaping Use: Never used  Substance and Sexual Activity   Alcohol use: No   Drug use: No   Sexual activity: Not on file  Other Topics Concern   Not on file  Social History Narrative   Not on file   Social Determinants of Health   Financial Resource Strain: Not on file  Food Insecurity: Not on file  Transportation Needs: Not on file  Physical Activity: Not on file  Stress: Not on file  Social Connections: Not on file  Intimate Partner Violence: Not on file     Constitutional: Denies fever, malaise, fatigue, headache or abrupt weight changes.  HEENT: Patient reports runny nose and sore throat.  Denies eye pain, eye redness, ear pain, ringing in the ears, wax buildup, nasal congestion, bloody nose. Respiratory: Patient reports cough and shortness of breath.  Denies difficulty breathing, or sputum production.   Cardiovascular: Denies chest pain, chest tightness, palpitations or swelling in the hands or feet.  Gastrointestinal: Denies abdominal pain, bloating, constipation, diarrhea or blood in the stool.   No other specific complaints in a complete review of systems (except as listed in HPI above).  Objective:   Physical Exam  BP 119/64    Pulse 86    Temp (!) 97.1 F (36.2 C) (Oral)    Ht 5\' 3"  (1.6 m)    Wt 181 lb (82.1 kg)    SpO2 100%    BMI 32.06 kg/m   Wt Readings from Last 3 Encounters:  09/04/21 180 lb (81.6 kg)  08/19/21 179 lb 6.4 oz (81.4 kg)  06/10/21 181 lb 3.2 oz (82.2 kg)    General: Appears her stated age, unwell but in NAD. Skin: Warm, dry and intact. No rashes noted. HEENT: Head: normal shape and size; Eyes: sclera [place], no icterus, conjunctiva pink, and EOMs intact; Throat/Mouth: Teeth present, mucosa erythematous and moist, no exudate, lesions or ulcerations noted.  Neck: No adenopathy noted. Cardiovascular: Normal rate and rhythm. S1,S2 noted.  No murmur,  rubs or gallops noted.  Pulmonary/Chest: Normal effort and positive vesicular breath sounds. No respiratory distress. No wheezes, rales or ronchi noted.  Neurological: Alert and oriented.    BMET    Component Value Date/Time   NA 141 09/04/2021 1021   K  4.4 09/04/2021 1021   K 3.8 12/15/2013 1202   CL 104 09/04/2021 1021   CO2 28 09/04/2021 1021   GLUCOSE 129 09/04/2021 1021   BUN 14 09/04/2021 1021   CREATININE 0.86 09/04/2021 1021   CALCIUM 9.7 09/04/2021 1021   GFRNONAA >60 01/19/2012 0949   GFRAA >60 01/19/2012 0949    Lipid Panel     Component Value Date/Time   CHOL 157 09/04/2021 1021   TRIG 101 09/04/2021 1021   HDL 53 09/04/2021 1021   CHOLHDL 3.0 09/04/2021 1021   LDLCALC 85 09/04/2021 1021    CBC    Component Value Date/Time   WBC 6.6 09/04/2021 1021   RBC 4.70 09/04/2021 1021   HGB 14.4 09/04/2021 1021   HCT 43.4 09/04/2021 1021   PLT 135 (L) 09/04/2021 1021   MCV 92.3 09/04/2021 1021   MCH 30.6 09/04/2021 1021   MCHC 33.2 09/04/2021 1021   RDW 12.6 09/04/2021 1021   LYMPHSABS 1,976 10/29/2020 1125   EOSABS 397 10/29/2020 1125   BASOSABS 89 10/29/2020 1125    Hgb A1C Lab Results  Component Value Date   HGBA1C 6.4 (H) 09/04/2021            Assessment & Plan:   Runny Nose, Sore Throat, Cough and Shortness of Breath, COPD:  Rapid strep positive Rx for Azithromycin 250 mg daily x5 days Okay to continue salt water gargles Chest x-ray today Continue Spiriva and Albuterol  We will follow-up after imaging with further recommendation and treatment plan  Webb Silversmith, NP This visit occurred during the SARS-CoV-2 public health emergency.  Safety protocols were in place, including screening questions prior to the visit, additional usage of staff PPE, and extensive cleaning of exam room while observing appropriate contact time as indicated for disinfecting solutions.

## 2021-10-07 NOTE — Patient Instructions (Signed)
Strep Throat, Adult ?Strep throat is an infection of the throat. It is caused by germs (bacteria). Strep throat is common during the cold months of the year. It mostly affects children who are 5-81 years old. However, people of all ages can get it at any time of the year. This infection spreads from person to person through coughing, sneezing, or having close contact. ?What are the causes? ?This condition is caused by the Streptococcus pyogenes germ. ?What increases the risk? ?You care for young children. Children are more likely to get strep throat and may spread it to others. ?You go to crowded places. Germs can spread easily in such places. ?You kiss or touch someone who has strep throat. ?What are the signs or symptoms? ?Fever or chills. ?Redness, swelling, or pain in the tonsils or throat. ?Pain or trouble when swallowing. ?[place] or yellow spots on the tonsils or throat. ?Tender glands in the neck and under the jaw. ?Bad breath. ?Red rash all over the body. This is rare. ?How is this treated? ?Medicines that kill germs (antibiotics). ?Medicines that treat pain or fever. These include: ?Ibuprofen or acetaminophen. ?Aspirin, only for people who are over the age of 18. ?Cough drops. ?Throat sprays. ?Follow these instructions at home: ?Medicines ? ?Take over-the-counter and prescription medicines only as told by your doctor. ?Take your antibiotic medicine as told by your doctor. Do not stop taking the antibiotic even if you start to feel better. ?Eating and drinking ? ?If you have trouble swallowing, eat soft foods until your throat feels better. ?Drink enough fluid to keep your pee (urine) pale yellow. ?To help with pain, you may have: ?Warm fluids, such as soup and tea. ?Cold fluids, such as frozen desserts or popsicles. ?General instructions ?Rinse your mouth (gargle) with a salt-water mixture 3-4 times a day or as needed. To make a salt-water mixture, dissolve ?-1 tsp (3-6 g) of salt in 1 cup (237 mL) of warm  water. ?Rest as much as you can. ?Stay home from work or school until you have been taking antibiotics for 24 hours. ?Do not smoke or use any products that contain nicotine or tobacco. If you need help quitting, ask your doctor. ?Keep all follow-up visits. ?How is this prevented? ? ?Do not share food, drinking cups, or personal items. They can cause the germs to spread. ?Wash your hands well with soap and water. Make sure that all people in your house wash their hands well. ?Have family members tested if they have a fever or a sore throat. They may need an antibiotic if they have strep throat. ?Contact a doctor if: ?You have swelling in your neck that keeps getting bigger. ?You get a rash, cough, or earache. ?You cough up a thick fluid that is green, yellow-brown, or bloody. ?You have pain that does not get better with medicine. ?Your symptoms get worse instead of getting better. ?You have a fever. ?Get help right away if: ?You vomit. ?You have a very bad headache. ?Your neck hurts or feels stiff. ?You have chest pain or are short of breath. ?You have drooling, very bad throat pain, or changes in your voice. ?Your neck is swollen, or the skin gets red and tender. ?Your mouth is dry, or you are peeing less than normal. ?You keep feeling more tired or have trouble waking up. ?Your joints are red or painful. ?These symptoms may be an emergency. Do not wait to see if the symptoms will go away. Get help right away. Call   your local emergency services (911 in the [place] ?Summary ?Strep throat is an infection of the throat. It is caused by germs (bacteria). ?This infection can spread from person to person through coughing, sneezing, or having close contact. ?Take your medicines, including antibiotics, as told by your doctor. Do not stop taking the antibiotic even if you start to feel better. ?To prevent the spread of germs, wash your hands well with soap and water. Have others do the same. Do not share food, drinking cups,  or personal items. ?Get help right away if you have a bad headache, chest pain, shortness of breath, a stiff or painful neck, or you vomit. ?This information is not intended to replace advice given to you by your health care provider. Make sure you discuss any questions you have with your health care provider. ?Document Revised: 12/31/2020 Document Reviewed: 12/31/2020 ?Elsevier Patient Education ? 2022 Elsevier Inc. ? ?

## 2021-10-08 ENCOUNTER — Other Ambulatory Visit: Payer: Self-pay | Admitting: Internal Medicine

## 2021-10-08 DIAGNOSIS — E1142 Type 2 diabetes mellitus with diabetic polyneuropathy: Secondary | ICD-10-CM

## 2021-10-08 NOTE — Telephone Encounter (Signed)
Requested Prescriptions  Pending Prescriptions Disp Refills   gabapentin (NEURONTIN) 300 MG capsule [Pharmacy Med Name: Gabapentin 300 MG Oral Capsule] 270 capsule 3    Sig: TAKE 1 CAPSULE BY MOUTH 3  TIMES DAILY     Neurology: Anticonvulsants - gabapentin Passed - 10/08/2021 10:38 PM      Passed - Valid encounter within last 12 months    Recent Outpatient Visits          Yesterday Shortness of breath   [facility], Coralie Keens, NP   1 month ago Encounter for general adult medical examination with abnormal findings   [facility] [place], Coralie Keens, NP   1 month ago Thrombocytopenia [facility])   [facility] [place], Coralie Keens, NP   4 months ago Needs flu shot   [facility] [place], Coralie Keens, NP   7 months ago [facility] [place], Coralie Keens, NP      Future Appointments            In 4 months Baity, Coralie Keens, NP [facility]

## 2021-10-16 ENCOUNTER — Telehealth: Payer: Self-pay | Admitting: Pharmacy Technician

## 2021-10-16 DIAGNOSIS — Z596 Low income: Secondary | ICD-10-CM

## 2021-10-16 NOTE — Progress Notes (Signed)
[facility])                                            [place] Team    10/16/2021  Farha Dano [facility] 1941/01/13 [id number]  Care coordination call placed to BI in regard to Spiriva application.  Spoke to [place] who informs patient is APPROVED 10/16/21-09/20/22. Patient will need to call in for refills as she needs them giving BI at least 2 weeks to process and deliver to her home.  Niam Nepomuceno P. Vitali Seibert, [place]  [phone]

## 2021-10-21 DIAGNOSIS — J410 Simple chronic bronchitis: Secondary | ICD-10-CM | POA: Diagnosis not present

## 2021-10-21 DIAGNOSIS — E039 Hypothyroidism, unspecified: Secondary | ICD-10-CM

## 2021-10-24 ENCOUNTER — Ambulatory Visit (INDEPENDENT_AMBULATORY_CARE_PROVIDER_SITE_OTHER): Payer: Medicare Other | Admitting: Pharmacist

## 2021-10-24 ENCOUNTER — Other Ambulatory Visit: Payer: Self-pay | Admitting: Internal Medicine

## 2021-10-24 DIAGNOSIS — J449 Chronic obstructive pulmonary disease, unspecified: Secondary | ICD-10-CM

## 2021-10-24 DIAGNOSIS — J410 Simple chronic bronchitis: Secondary | ICD-10-CM

## 2021-10-24 NOTE — Telephone Encounter (Signed)
Requested Prescriptions  Pending Prescriptions Disp Refills   albuterol (VENTOLIN HFA) 108 (90 Base) MCG/ACT inhaler [Pharmacy Med Name: ALBUTEROL HFA 90MCG/ACT (PA)] 17 g 6    Sig: USE 1 TO 2 INHALATIONS BY  MOUTH INTO THE LUNGS EVERY  6 HOURS AS NEEDED FOR  WHEEZING OR SHORTNESS OF  BREATH     Pulmonology:  Beta Agonists 2 Passed - 10/24/2021  1:21 PM      Passed - Last BP in normal range    BP Readings from Last 1 Encounters:  10/07/21 119/64         Passed - Last Heart Rate in normal range    Pulse Readings from Last 1 Encounters:  10/07/21 86         Passed - Valid encounter within last 12 months    Recent Outpatient Visits          2 weeks ago Shortness of breath   [place], NP   1 month ago Encounter for general adult medical examination with abnormal findings   [facility] [place], Coralie Keens, NP   2 months ago Thrombocytopenia [facility])   [facility] [place], Coralie Keens, NP   4 months ago Needs flu shot   [facility] [place], Coralie Keens, NP   8 months ago [facility] [place], Coralie Keens, NP      Future Appointments            In 1 month McGowan, Hunt Oris, PA-C [facility]   In 4 months [place], Coralie Keens, NP [facility], [facility]

## 2021-10-24 NOTE — Chronic Care Management (AMB) (Signed)
Chronic Care Management CCM Pharmacy Note  10/24/2021 Name:  Brooke King MRN:  [id number] DOB:  [birth year]  Subjective: Brooke King is an 81 y.o. year old female who is a primary patient of Jearld Fenton, NP.  The CCM team was consulted for assistance with disease management and care coordination needs.    Engaged with patient by telephone for follow up visit for pharmacy case management and/or care coordination services.   Objective:  Medications Reviewed Today     Reviewed by Rennis Petty, RPH-CPP (Pharmacist) on 10/24/21 at 1725  Med List Status: <None>   Medication Order Taking? Sig Documenting Provider Last Dose Status Informant  acetaminophen (TYLENOL) 500 MG tablet [id number]  Take 500 mg by mouth every 6 (six) hours as needed. [provider]  Active   albuterol (VENTOLIN HFA) 108 (90 Base) MCG/ACT inhaler [id number]  USE 1 TO 2 INHALATIONS BY  MOUTH INTO THE LUNGS EVERY  6 HOURS AS NEEDED FOR  WHEEZING OR SHORTNESS OF  BREATH Baity, Coralie Keens, NP  Active   atorvastatin (LIPITOR) 10 MG tablet [id number]  Take 1 tablet (10 mg total) by mouth daily. Jearld Fenton, NP  Active   Calcium Carb-Cholecalciferol (CALCIUM + D3) 600-200 MG-UNIT TABS [id number]  Take by mouth. [provider]  Active   Cranberry 180 MG CAPS [id number]  Take by mouth. [provider]  Active   fexofenadine (ALLEGRA) 180 MG tablet [id number]  Take 180 mg by mouth daily. [provider]  Active   gabapentin (NEURONTIN) 300 MG capsule [id number]  TAKE 1 CAPSULE BY MOUTH 3  TIMES DAILY Baity, Coralie Keens, NP  Active   gentamicin ointment (GARAMYCIN) 0.1 % [id number]  Apply topically. [provider]  Active   hydrOXYzine (ATARAX/VISTARIL) 10 MG tablet [id number]  Take 1 tablet (10 mg total) by mouth daily as needed. Jearld Fenton, NP  Active   latanoprost (XALATAN) 0.005 % ophthalmic solution [id number]  Place 1 drop into both eyes at bedtime. [provider]   Active   montelukast (SINGULAIR) 10 MG tablet [id number]  Take 10 mg by mouth at bedtime. [provider]  Active   omeprazole (PRILOSEC) 20 MG capsule [id number]  Take 1 capsule by mouth daily. [provider]  Active   SYNTHROID 112 MCG tablet [id number]  TAKE 1 TABLET BY MOUTH  DAILY BEFORE BREAKFAST Baity, Coralie Keens, NP  Active   tiotropium (SPIRIVA) 18 MCG inhalation capsule [id number] Yes Place 18 mcg into inhaler and inhale daily. [provider] Taking Active   traZODone (DESYREL) 50 MG tablet [id number]  Take 0.5 tablets (25 mg total) by mouth at bedtime as needed for sleep. Jearld Fenton, NP  Active   valsartan (DIOVAN) 80 MG tablet [id number]  Take 80 mg by mouth daily. [provider]  Active   vitamin B-12 (CYANOCOBALAMIN) 1000 MCG tablet [id number]  Take 1,000 mcg by mouth daily. [provider]  Active             Pertinent Labs:   Lab Results  Component Value Date   CHOL 157 09/04/2021   HDL 53 09/04/2021   LDLCALC 85 09/04/2021   TRIG 101 09/04/2021   CHOLHDL 3.0 09/04/2021   Lab Results  Component Value Date   CREATININE 0.86 09/04/2021   BUN 14 09/04/2021   NA 141 09/04/2021   K 4.4 09/04/2021   CL 104 09/04/2021   CO2 28 09/04/2021    SDOH:  (  Social Determinants of Health) assessments and interventions performed:    CCM Care Plan  Review of patient past medical history, allergies, medications, health status, including review of consultants reports, laboratory and other test data, was performed as part of comprehensive evaluation and provision of chronic care management services.   Care Plan : PharmD - Medication Assistance  Updates made by Rennis Petty, RPH-CPP since 10/24/2021 12:00 AM     Problem: Disease Progression      Long-Range Goal: Disease Progression Prevented or Minimized   Start Date: 08/27/2021  Expected End Date: 11/25/2021  Recent Progress: On track  Priority: High  Note:   Current  Barriers:  Unable to independently afford treatment regimen Reports cost of Spiriva Handihaler is unaffordable, particularly when in the coverage gap of her [employer] Advantage plan Patient has Partial Extra Help  Pharmacist Clinical Goal(s):  patient will verbalize ability to afford treatment regimen through collaboration with PharmD and provider.    Interventions: 1:1 collaboration with Jearld Fenton, NP regarding development and update of comprehensive plan of care as evidenced by provider attestation and co-signature Inter-disciplinary care team collaboration (see longitudinal plan of care) Perform chart review. Patient seen for Office Visit with PCP on 1/17 for runny nose, sore throat, cough and shortness of breath. Provider advised patient: Rapid strep positive Rx sent for Azithromycin 250 mg daily x5 days Okay to continue salt water gargles Chest x-ray today Continue Spiriva and Albuterol Today patient reports throat symptoms improved with completing on azithromycin course, but still has symptoms including runny nose States will call office to follow up with PCP regarding symptoms Also reports has just started to have symptoms of UTI Reports will contact Urologist office today for evaluation  Medication Assistance: Collaborated with [place] Simcox for aid to patient with applying for Spiriva patient assistance from [place] message from [place] 10/16/21-09/20/22 for patient assistance program Initial approval submitted for Spiriva Respimat, but patient has expressed preference for Spiriva Handihaler.  Collaborated with PCP to have new Rx sent to program Follow up with [employer] patient assistance program today and confirm Rx for Spiriva Handihaler received. Representative states medication was shipped today and should reach patient sometime next week. Note patient to call every 3 months for refill Provide this  update to patient today  COPD: Current treatment: Albuterol inhaler - 1-2 puffs into lungs every 6 hours as needed Spiriva Respimat - 2 puffs once daily Patient reports read over Spiriva patient leaflet and noticed this medication to be used with caution in narrow-angle glaucoma and she has a history of glaucoma Encourage patient to follow up with Ophthalmologist regarding this concern and to let PCP/CM Pharmacist know if she would like to consider an alternative therapy for her COPD management Identify patient in need of refill of albuterol inhaler Confirms will contact pharmacy to request refill  Hypothyroidism: Controlled; current treatment: Synthroid 112 mcg daily before breakfast Have counseled patient on importance of consistently taking Synthroid each morning on an empty stomach, at least 30 minutes before food and on importance of separating the doses of the thyroid product and the oral calcium supplement by at least 4 hours   Patient Goals/Self-Care Activities patient will:  - collaborate with provider on medication access solutions       Plan: Telephone follow up appointment with care management team member scheduled for:  11/10/2021 at 2:30 PM  Wallace Cullens, PharmD, BCACP, CPP Clinical Pharmacist [place]  [place] [phone]

## 2021-10-24 NOTE — Patient Instructions (Signed)
Visit Information  Thank you for taking time to visit with me today. Please don't hesitate to contact me if I can be of assistance to you before our next scheduled telephone appointment.  Following are the goals we discussed today:   Goals Addressed             This Visit's Progress    Pharmacy Goals       Please follow up with your pharmacy regarding receiving Tetanus and Shingles Vaccinations   Feel free to call me with any questions or concerns. I look forward to our next call!   Wallace Cullens, PharmD, Para [place], CPP Clinical Pharmacist [facility] [phone]         Our next appointment is by telephone on 11/10/2021 at 2:30 PM  Please call the care guide team at [phone] if you need to cancel or reschedule your appointment.    Patient verbalizes understanding of instructions and care plan provided today and agrees to view in [place]. Active MyChart status confirmed with patient.

## 2021-11-03 DIAGNOSIS — G4733 Obstructive sleep apnea (adult) (pediatric): Secondary | ICD-10-CM | POA: Diagnosis not present

## 2021-11-07 DIAGNOSIS — J329 Chronic sinusitis, unspecified: Secondary | ICD-10-CM | POA: Diagnosis not present

## 2021-11-07 DIAGNOSIS — J300 Vasomotor rhinitis: Secondary | ICD-10-CM | POA: Diagnosis not present

## 2021-11-07 DIAGNOSIS — R052 Subacute cough: Secondary | ICD-10-CM | POA: Diagnosis not present

## 2021-11-10 ENCOUNTER — Ambulatory Visit: Payer: Medicare Other | Admitting: Pharmacist

## 2021-11-10 DIAGNOSIS — J410 Simple chronic bronchitis: Secondary | ICD-10-CM

## 2021-11-10 DIAGNOSIS — I10 Essential (primary) hypertension: Secondary | ICD-10-CM

## 2021-11-10 NOTE — Chronic Care Management (AMB) (Signed)
Chronic Care Management CCM Pharmacy Note  11/10/2021 Name:  Brooke King MRN:  [id number] DOB:  April 07, 1941   Subjective: Brooke King is an 81 y.o. year old female who is a primary patient of Jearld Fenton, NP.  The CCM team was consulted for assistance with disease management and care coordination needs.    Engaged with patient by telephone for follow up visit for pharmacy case management and/or care coordination services.   Objective:  Medications Reviewed Today     Reviewed by Rennis Petty, RPH-CPP (Pharmacist) on 10/24/21 at 1725  Med List Status: <None>   Medication Order Taking? Sig Documenting Provider Last Dose Status Informant  acetaminophen (TYLENOL) 500 MG tablet [id number]  Take 500 mg by mouth every 6 (six) hours as needed. [provider]  Active   albuterol (VENTOLIN HFA) 108 (90 Base) MCG/ACT inhaler [id number]  USE 1 TO 2 INHALATIONS BY  MOUTH INTO THE LUNGS EVERY  6 HOURS AS NEEDED FOR  WHEEZING OR SHORTNESS OF  BREATH Baity, Coralie Keens, NP  Active   atorvastatin (LIPITOR) 10 MG tablet [id number]  Take 1 tablet (10 mg total) by mouth daily. Jearld Fenton, NP  Active   Calcium Carb-Cholecalciferol (CALCIUM + D3) 600-200 MG-UNIT TABS [id number]  Take by mouth. [provider]  Active   Cranberry 180 MG CAPS [id number]  Take by mouth. [provider]  Active   fexofenadine (ALLEGRA) 180 MG tablet [id number]  Take 180 mg by mouth daily. [provider]  Active   gabapentin (NEURONTIN) 300 MG capsule [id number]  TAKE 1 CAPSULE BY MOUTH 3  TIMES DAILY Baity, Coralie Keens, NP  Active   gentamicin ointment (GARAMYCIN) 0.1 % [id number]  Apply topically. [provider]  Active   hydrOXYzine (ATARAX/VISTARIL) 10 MG tablet [id number]  Take 1 tablet (10 mg total) by mouth daily as needed. Jearld Fenton, NP  Active   latanoprost (XALATAN) 0.005 % ophthalmic solution [id number]  Place 1 drop into both eyes at bedtime. [provider]   Active   montelukast (SINGULAIR) 10 MG tablet [id number]  Take 10 mg by mouth at bedtime. [provider]  Active   omeprazole (PRILOSEC) 20 MG capsule [id number]  Take 1 capsule by mouth daily. [provider]  Active   SYNTHROID 112 MCG tablet [id number]  TAKE 1 TABLET BY MOUTH  DAILY BEFORE BREAKFAST Baity, Coralie Keens, NP  Active   tiotropium (SPIRIVA) 18 MCG inhalation capsule [id number] Yes Place 18 mcg into inhaler and inhale daily. [provider] Taking Active   traZODone (DESYREL) 50 MG tablet [id number]  Take 0.5 tablets (25 mg total) by mouth at bedtime as needed for sleep. Jearld Fenton, NP  Active   valsartan (DIOVAN) 80 MG tablet [id number]  Take 80 mg by mouth daily. [provider]  Active   vitamin B-12 (CYANOCOBALAMIN) 1000 MCG tablet [id number]  Take 1,000 mcg by mouth daily. [provider]  Active             Pertinent Labs:   Lab Results  Component Value Date   CHOL 157 09/04/2021   HDL 53 09/04/2021   LDLCALC 85 09/04/2021   TRIG 101 09/04/2021   CHOLHDL 3.0 09/04/2021   Lab Results  Component Value Date   CREATININE 0.86 09/04/2021   BUN 14 09/04/2021   NA 141 09/04/2021   K 4.4 09/04/2021   CL 104 09/04/2021   CO2 28 09/04/2021   BP Readings  from Last 3 Encounters:  10/07/21 119/64  09/04/21 (!) 125/47  08/19/21 (!) 120/57   Pulse Readings from Last 3 Encounters:  10/07/21 86  09/04/21 72  08/19/21 81     SDOH:  (Social Determinants of Health) assessments and interventions performed:    [place]  Review of patient past medical history, allergies, medications, health status, including review of consultants reports, laboratory and other test data, was performed as part of comprehensive evaluation and provision of chronic care management services.   Care Plan : PharmD - Medication Assistance  Updates made by Rennis Petty, RPH-CPP since 11/10/2021 12:00 AM     Problem: Disease Progression       Long-Range Goal: Disease Progression Prevented or Minimized   Start Date: 08/27/2021  Expected End Date: 11/25/2021  This Visit's Progress: On track  Recent Progress: On track  Priority: High  Note:   Current Barriers:  Unable to independently afford treatment regimen Reports cost of Spiriva Handihaler is unaffordable, particularly when in the coverage gap of her [place] Patient has Partial Extra Help Patient APPROVED to receive Spiriva from [place] patient assistance program through 09/20/2022  Pharmacist Clinical Goal(s):  patient will verbalize ability to afford treatment regimen through collaboration with PharmD and provider.    Interventions: 1:1 collaboration with Jearld Fenton, NP regarding development and update of comprehensive plan of care as evidenced by provider attestation and co-signature Inter-disciplinary care team collaboration (see longitudinal plan of care) Today reports she was seen by ENT specialist last week and provider started her on course of Clindamycin 300 mg three times daily for 10 days Counsel patient on importance of taking clindamycin doses with full glass of water, completing full course even if feeling better sooner and monitoring for diarrhea Also reports has had pain/burning with urination for a couple of days and is taking OTC urinary relief product Advise patient to contact PCP office or Urology today for appointment for evaluation of current urinary symptoms  Medication Assistance: Collaborated with [place] Simcox for aid to patient with applying for Spiriva patient assistance from [place] message from [place] patient [place] 10/16/21-09/20/22 for patient assistance program Today patient confirms received a 3 month supply of Spiriva from the program  COPD: Current treatment: Albuterol inhaler - 1-2 puffs into lungs every 6 hours as needed Spiriva  Handihaler - 1 capsule via inhaler once daily Confirms received refill of albuterol inhaler and has to use as needed as directed.  Hypothyroidism: Controlled; current treatment: Synthroid 112 mcg daily before breakfast Have counseled patient on importance of consistently taking Synthroid each morning on an empty stomach, at least 30 minutes before food and on importance of separating the doses of the thyroid product and the oral calcium supplement by at least 4 hours  Hypertension: Current treament: valsartan 80 mg daily Reports last checked home BP on 2/18: 128/75, HR 81  Patient Goals/Self-Care Activities patient will:  - collaborate with provider on medication access solutions       Plan: Telephone follow up appointment with care management team member scheduled for:  3/27 at 3 pm  Wallace Cullens, PharmD, [place], [place] [phone]

## 2021-11-10 NOTE — Patient Instructions (Signed)
Visit Information  Thank you for taking time to visit with me today. Please don't hesitate to contact me if I can be of assistance to you before our next scheduled telephone appointment.  Following are the goals we discussed today:   Goals Addressed             This Visit's Progress    Pharmacy Goals        Feel free to call me with any questions or concerns. I look forward to our next call!   Wallace Cullens, PharmD, Para March, CPP Clinical Pharmacist [facility] [phone]         Our next appointment is by telephone on 3/27 at 3 pm  Please call the care guide team at [phone] if you need to cancel or reschedule your appointment.    Patient verbalizes understanding of instructions and care plan provided today and agrees to view in [place]. Active MyChart status confirmed with patient.

## 2021-11-18 DIAGNOSIS — I10 Essential (primary) hypertension: Secondary | ICD-10-CM

## 2021-11-18 DIAGNOSIS — J410 Simple chronic bronchitis: Secondary | ICD-10-CM | POA: Diagnosis not present

## 2021-11-25 NOTE — Progress Notes (Unsigned)
11/26/2021 11:09 AM   Brooke King 21-Mar-1941 [id number]  Referring provider: Jearld Fenton, NP [place],  [place] [address]  Chief Complaint  Patient presents with   Urinary Incontinence   Urological history: 1. Incontinence -contributing factors of obesity, vaginal delivery, age, smoking, vaginal atrophy and sleep apnea -PVR 18 mL  2. Adrenal adenoma -MR 2013 - adrenal adenoma  HPI: Brooke King is a 81 y.o. female who presents today for leakage and frequency.   She is experiencing 8 or more daytime urinations, nocturia x1-2 with a strong urge to urinate.  She is leaking urine with laughing and coughing.  She leaks 3 times daily and uses 3-4 absorbent pads daily.  She does engage in toilet mapping.  She thinks her bladder has fell.  She has suprapubic pain and LBP and doesn't feel right.    PVR 18 mL    PMH: Past Medical History:  Diagnosis Date   Allergy    B12 deficiency    Chest pain syndrome    COPD (chronic obstructive pulmonary disease) (HCC)    Depression    GERD (gastroesophageal reflux disease)    Glaucoma    History of colonic polyps    History of degenerative disc disease    c-spine, status post cervical laminectomy   Hyperlipidemia    Hypertension    Hypothyroidism    Insomnia    Migraine    Restless leg syndrome    Seasonal allergies    Sleep apnea    Thyroid cancer ([place])    Urinary incontinence     Surgical History: Past Surgical History:  Procedure Laterality Date   ABDOMINAL HYSTERECTOMY     CATARACT EXTRACTION, BILATERAL     CERVICAL LAMINECTOMY  08/2004   CHOLECYSTECTOMY     COLONOSCOPY WITH PROPOFOL N/A 11/18/2015   Procedure: COLONOSCOPY WITH PROPOFOL;  Surgeon: Hulen Luster, MD;  Location[facility] ENDOSCOPY;  Service: Gastroenterology;  Laterality: N/A;   ESOPHAGOGASTRODUODENOSCOPY (EGD) WITH PROPOFOL N/A 11/18/2015   Procedure: ESOPHAGOGASTRODUODENOSCOPY (EGD) WITH PROPOFOL;  Surgeon: Hulen Luster, MD;  Location: [facility] ENDOSCOPY;   Service: Gastroenterology;  Laterality: N/A;   HEMORRHOID SURGERY     TUBAL LIGATION      Home Medications:  Allergies as of 11/26/2021       Reactions   Toviaz [fesoterodine Fumarate Er] Other (See Comments)   Throat closed   Cortisone    Doxycycline Hyclate    Hydrocodone    Keflex [cephalexin]    Septra [sulfamethoxazole-trimethoprim]    Tagamet [cimetidine]    Zoloft [sertraline]    Penicillins Rash        Medication List        Accurate as of November 26, 2021 11:09 AM. If you have any questions, ask your nurse or doctor.          STOP taking these medications    clindamycin 300 MG capsule Commonly known as: CLEOCIN Stopped by: Zara Council, PA-C       TAKE these medications    acetaminophen 500 MG tablet Commonly known as: TYLENOL Take 500 mg by mouth every 6 (six) hours as needed.   albuterol 108 (90 Base) MCG/ACT inhaler Commonly known as: VENTOLIN HFA USE 1 TO 2 INHALATIONS BY  MOUTH INTO THE LUNGS EVERY  6 HOURS AS NEEDED FOR  WHEEZING OR SHORTNESS OF  BREATH   atorvastatin 10 MG tablet Commonly known as: LIPITOR Take 1 tablet (10 mg total) by mouth  daily.   Calcium + D3 600-200 MG-UNIT Tabs Take by mouth.   Cranberry 180 MG Caps Take by mouth.   fexofenadine 180 MG tablet Commonly known as: ALLEGRA Take 180 mg by mouth daily.   gabapentin 300 MG capsule Commonly known as: NEURONTIN TAKE 1 CAPSULE BY MOUTH 3  TIMES DAILY   gentamicin ointment 0.1 % Commonly known as: GARAMYCIN Apply topically.   hydrOXYzine 10 MG tablet Commonly known as: ATARAX Take 1 tablet (10 mg total) by mouth daily as needed.   ipratropium 0.03 % nasal spray Commonly known as: ATROVENT Place into both nostrils.   latanoprost 0.005 % ophthalmic solution Commonly known as: XALATAN Place 1 drop into both eyes at bedtime.   montelukast 10 MG tablet Commonly known as: SINGULAIR Take 10 mg by mouth at bedtime.   omeprazole 20 MG capsule Commonly known  as: PRILOSEC Take 1 capsule by mouth daily.   Synthroid 112 MCG tablet Generic drug: levothyroxine TAKE 1 TABLET BY MOUTH  DAILY BEFORE BREAKFAST   tiotropium 18 MCG inhalation capsule Commonly known as: SPIRIVA Place 18 mcg into inhaler and inhale daily.   traZODone 50 MG tablet Commonly known as: DESYREL Take 0.5 tablets (25 mg total) by mouth at bedtime as needed for sleep.   valsartan 80 MG tablet Commonly known as: DIOVAN Take 80 mg by mouth daily.   vitamin B-12 1000 MCG tablet Commonly known as: CYANOCOBALAMIN Take 1,000 mcg by mouth daily.        Allergies:  Allergies  Allergen Reactions   Toviaz [Fesoterodine Fumarate Er] Other (See Comments)    Throat closed   Cortisone    Doxycycline Hyclate    Hydrocodone    Keflex [Cephalexin]    Septra [Sulfamethoxazole-Trimethoprim]    Tagamet [Cimetidine]    Zoloft [Sertraline]    Penicillins Rash    Family History: Family History  Problem Relation Age of Onset   Heart disease Mother    Breast cancer Neg Hx     Social History:  reports that she has been smoking cigarettes. She has a 40.00 pack-year smoking history. She has never used smokeless tobacco. She reports that she does not drink alcohol and does not use drugs.  ROS: Pertinent ROS in HPI  Physical Exam: BP 126/70    Pulse 91    Ht '5\' 3"'$  (1.6 m)    Wt 180 lb 6.4 oz (81.8 kg)    SpO2 97%    BMI 31.96 kg/m   Constitutional:  Well nourished. Alert and oriented, No acute distress. HEENT: [place] AT, moist mucus membranes.  Trachea midline, no masses. Cardiovascular: No clubbing, cyanosis, or edema. Respiratory: Normal respiratory effort, no increased work of breathing. GI: Abdomen is soft, non tender, non distended, no abdominal masses. Liver and spleen not palpable.  No hernias appreciated.  Stool sample for occult testing is not indicated.   GU: No CVA tenderness.  No bladder fullness or masses.  *** external genitalia, *** pubic hair distribution, no  lesions.  Normal urethral meatus, no lesions, no prolapse, no discharge.   No urethral masses, tenderness and/or tenderness. No bladder fullness, tenderness or masses. *** vagina mucosa, *** estrogen effect, no discharge, no lesions, *** pelvic support, *** cystocele and *** rectocele noted.  No cervical motion tenderness.  Uterus is freely mobile and non-fixed.  No adnexal/parametria masses or tenderness noted.  Anus and perineum are without rashes or lesions.   ***  Skin: No rashes, bruises or suspicious lesions. Lymph: No cervical  or inguinal adenopathy. Neurologic: Grossly intact, no focal deficits, moving all 4 extremities. Psychiatric: Normal mood and affect.    Laboratory Data: Lab Results  Component Value Date   WBC 6.6 09/04/2021   HGB 14.4 09/04/2021   HCT 43.4 09/04/2021   MCV 92.3 09/04/2021   PLT 135 (L) 09/04/2021    Lab Results  Component Value Date   CREATININE 0.86 09/04/2021    Lab Results  Component Value Date   HGBA1C 6.4 (H) 09/04/2021    Lab Results  Component Value Date   TSH 1.64 09/04/2021       Component Value Date/Time   CHOL 157 09/04/2021 1021   HDL 53 09/04/2021 1021   CHOLHDL 3.0 09/04/2021 1021   LDLCALC 85 09/04/2021 1021    Lab Results  Component Value Date   AST 21 09/04/2021   Lab Results  Component Value Date   ALT 19 09/04/2021    Urinalysis ***   I have reviewed the labs.   Pertinent Imaging:  11/26/21 11:06  Scan Result 18    Assessment & Plan:  ***  1. Incontinence ***  No follow-ups on file.  These notes generated with voice recognition software. I apologize for typographical errors.  Zara Council, PA-C  [facility] [address]  [place], [place] [address] [phone]

## 2021-11-26 ENCOUNTER — Encounter: Payer: Self-pay | Admitting: Urology

## 2021-11-26 ENCOUNTER — Other Ambulatory Visit: Payer: Self-pay

## 2021-11-26 ENCOUNTER — Ambulatory Visit: Payer: Medicare Other | Admitting: Urology

## 2021-11-26 VITALS — BP 126/70 | HR 91 | Ht 63.0 in | Wt 180.4 lb

## 2021-11-26 DIAGNOSIS — N952 Postmenopausal atrophic vaginitis: Secondary | ICD-10-CM | POA: Diagnosis not present

## 2021-11-26 DIAGNOSIS — N3946 Mixed incontinence: Secondary | ICD-10-CM | POA: Diagnosis not present

## 2021-11-26 DIAGNOSIS — R3129 Other microscopic hematuria: Secondary | ICD-10-CM | POA: Diagnosis not present

## 2021-11-26 DIAGNOSIS — N8111 Cystocele, midline: Secondary | ICD-10-CM | POA: Diagnosis not present

## 2021-11-26 LAB — URINALYSIS, COMPLETE
Bilirubin, UA: NEGATIVE
Glucose, UA: NEGATIVE
Ketones, UA: NEGATIVE
Nitrite, UA: NEGATIVE
Protein,UA: NEGATIVE
Specific Gravity, UA: 1.015 (ref 1.005–1.030)
Urobilinogen, Ur: 0.2 mg/dL (ref 0.2–1.0)
pH, UA: 6.5 (ref 5.0–7.5)

## 2021-11-26 LAB — MICROSCOPIC EXAMINATION: WBC, UA: >30 /hpf — AB (ref 0–5)

## 2021-11-26 LAB — BLADDER SCAN AMB NON-IMAGING: Scan Result: 18

## 2021-11-26 MED ORDER — PREMARIN 0.625 MG/GM VA CREA: TOPICAL_CREAM | VAGINAL | 12 refills | Status: DC

## 2021-11-26 NOTE — Patient Instructions (Addendum)
You are given a sample of vaginal estrogen cream Premarin and instructed to apply 0.'5mg'$  (pea-sized amount)  just inside the vaginal introitus with a finger-tip on Monday, Wednesday and Friday nights,   ?  ? ?Follow up here in 3 months. ? ? ?Please call and ask to speak with a nurse if you develop questions or concerns. ? ?

## 2021-12-03 ENCOUNTER — Telehealth: Payer: Self-pay | Admitting: Urology

## 2021-12-03 ENCOUNTER — Other Ambulatory Visit: Payer: Self-pay | Admitting: *Deleted

## 2021-12-03 LAB — CULTURE, URINE COMPREHENSIVE

## 2021-12-03 MED ORDER — CIPROFLOXACIN HCL 250 MG PO TABS: 250.0000 mg | ORAL_TABLET | Freq: Two times a day (BID) | ORAL | 0 refills | Status: AC

## 2021-12-03 NOTE — Telephone Encounter (Signed)
Per result note from Zara Council, PA: Please let Brooke King know that her urine culture was positive and she needs to start Cipro 250 mg, twice daily for seven days.  ? ?Patient notified of positive urine culture results and to start the antibiotics.  ? ?Patient would like Rx sent to [facility]. ? ? ?

## 2021-12-03 NOTE — Progress Notes (Signed)
Cipro 250

## 2021-12-03 NOTE — Telephone Encounter (Signed)
rx sent to pharmacy by e-script  

## 2021-12-04 ENCOUNTER — Telehealth: Payer: Self-pay | Admitting: Urology

## 2021-12-04 NOTE — Telephone Encounter (Signed)
Please let Brooke King know that [facility] is not taking any new patients, so I would like to see her back in three weeks to make sure her infection has cleared and to discuss other options.  ?

## 2021-12-05 NOTE — Telephone Encounter (Signed)
Spoke with patient and advised results Appt scheduled 

## 2021-12-11 ENCOUNTER — Other Ambulatory Visit: Payer: Self-pay | Admitting: Internal Medicine

## 2021-12-11 DIAGNOSIS — F5101 Primary insomnia: Secondary | ICD-10-CM

## 2021-12-11 NOTE — Telephone Encounter (Signed)
Medication Refill - Medication: traZODone (DESYREL) 50 MG tablet ? ?No new symptoms helps her sleep. ? ?Has the patient contacted their pharmacy? No. No, more refills. ?(Agent: If no, request that the patient contact the pharmacy for the refill. If patient does not wish to contact the pharmacy document the reason why and proceed with request.) ? ? ?Preferred Pharmacy (with phone number or street name):  ?[facility], [place] - [address]  ?[place] [address]  ?Phone: [phone] Fax: [phone]  ?Hours: Not open 24 hours  ? ?Has the patient been seen for an appointment in the last year OR does the patient have an upcoming appointment? Yes.   ? ?Agent: Please be advised that RX refills may take up to 3 business days. We ask that you follow-up with your pharmacy.  ?

## 2021-12-12 NOTE — Telephone Encounter (Signed)
Informed patient please allow 48 to 72 hour turn around time. Patient wanted to know if PCP can expedite request.  ?

## 2021-12-13 MED ORDER — TRAZODONE HCL 50 MG PO TABS: 25.0000 mg | ORAL_TABLET | Freq: Every evening | ORAL | 0 refills | Status: DC | PRN

## 2021-12-13 NOTE — Telephone Encounter (Signed)
Requested Prescriptions  ?Pending Prescriptions Disp Refills  ?? traZODone (DESYREL) 50 MG tablet 45 tablet 0  ?  Sig: Take 0.5 tablets (25 mg total) by mouth at bedtime as needed for sleep.  ?  ? Psychiatry: Antidepressants - Serotonin Modulator Passed - 12/12/2021 10:06 AM  ?  ?  Passed - Valid encounter within last 6 months  ?  Recent Outpatient Visits   ?      ? 2 months ago Shortness of breath  ? [facility] [place], Coralie Keens, NP  ? 3 months ago Encounter for general adult medical examination with abnormal findings  ? [facility] [place], [place] W, NP  ? 3 months ago Thrombocytopenia [facility])  ? [facility] [place], [place] W, NP  ? 6 months ago Needs flu shot  ? [facility] [place], Coralie Keens, NP  ? 9 months ago Anxiety  ? [facility] [place], Coralie Keens, NP  ?  ?  ?Future Appointments   ?        ? In 2 weeks McGowan, Gordan Payment [place]  ? In 2 months McGowan, Gordan Payment [place]  ? In 2 months Baity, Coralie Keens, NP [facility] [place]  ?  ? ?  ?  ?  ? ?

## 2021-12-15 ENCOUNTER — Ambulatory Visit (INDEPENDENT_AMBULATORY_CARE_PROVIDER_SITE_OTHER): Payer: Medicare Other | Admitting: Pharmacist

## 2021-12-15 DIAGNOSIS — I10 Essential (primary) hypertension: Secondary | ICD-10-CM

## 2021-12-15 DIAGNOSIS — J410 Simple chronic bronchitis: Secondary | ICD-10-CM

## 2021-12-15 NOTE — Chronic Care Management (AMB) (Signed)
? ?Chronic Care Management ?CCM Pharmacy Note ? ?12/15/2021 ?Name:  Brooke King MRN:  [id number] DOB:  August 06, 1941 ? ? ?Subjective: ?Brooke King is an 81 y.o. year old female who is a primary patient of Brooke Fenton, NP.  The CCM team was consulted for assistance with disease management and care coordination needs.   ? ?Engaged with patient by telephone for follow up visit for pharmacy case management and/or care coordination services.  ? ?Objective: ? ?Medications Reviewed Today   ? ? Reviewed by Lesly Rubenstein, LPN (Licensed Practical Nurse) on 11/26/21 at 1106  Med List Status: <None>  ? ?Medication Order Taking? Sig Documenting Provider Last Dose Status Informant  ?acetaminophen (TYLENOL) 500 MG tablet [id number] Yes Take 500 mg by mouth every 6 (six) hours as needed. [provider] Taking Active   ?albuterol (VENTOLIN HFA) 108 (90 Base) MCG/ACT inhaler [id number] Yes USE 1 TO 2 INHALATIONS BY  MOUTH INTO THE LUNGS EVERY  6 HOURS AS NEEDED FOR  WHEEZING OR SHORTNESS OF  BREATH Baity, Brooke Keens, NP Taking Active   ?atorvastatin (LIPITOR) 10 MG tablet [id number] Yes Take 1 tablet (10 mg total) by mouth daily. Brooke Fenton, NP Taking Active   ?Calcium Carb-Cholecalciferol (CALCIUM + D3) 600-200 MG-UNIT TABS [id number] Yes Take by mouth. [provider] Taking Active   ?Cranberry 180 MG CAPS [id number] Yes Take by mouth. [provider] Taking Active   ?fexofenadine (ALLEGRA) 180 MG tablet [id number] Yes Take 180 mg by mouth daily. [provider] Taking Active   ?gabapentin (NEURONTIN) 300 MG capsule [id number] Yes TAKE 1 CAPSULE BY MOUTH 3  TIMES DAILY Baity, Brooke Keens, NP Taking Active   ?gentamicin ointment (GARAMYCIN) 0.1 % [id number] Yes Apply topically. [provider] Taking Active   ?hydrOXYzine (ATARAX/VISTARIL) 10 MG tablet [id number] Yes Take 1 tablet (10 mg total) by mouth daily as needed. Brooke Fenton, NP Taking Active   ?ipratropium (ATROVENT) 0.03 %  nasal spray [id number] Yes Place into both nostrils. [provider] Taking Active   ?latanoprost (XALATAN) 0.005 % ophthalmic solution [id number] Yes Place 1 drop into both eyes at bedtime. [provider] Taking Active   ?montelukast (SINGULAIR) 10 MG tablet [id number] Yes Take 10 mg by mouth at bedtime. [provider] Taking Active   ?omeprazole (PRILOSEC) 20 MG capsule [id number] Yes Take 1 capsule by mouth daily. [provider] Taking Active   ?SYNTHROID 112 MCG tablet [id number] Yes TAKE 1 TABLET BY MOUTH  DAILY BEFORE BREAKFAST Baity, Brooke Keens, NP Taking Active   ?tiotropium (SPIRIVA) 18 MCG inhalation capsule [id number] Yes Place 18 mcg into inhaler and inhale daily. [provider] Taking Active   ?traZODone (DESYREL) 50 MG tablet [id number] Yes Take 0.5 tablets (25 mg total) by mouth at bedtime as needed for sleep. Brooke Fenton, NP Taking Active   ?valsartan (DIOVAN) 80 MG tablet [id number] Yes Take 80 mg by mouth daily. [provider] Taking Active   ?vitamin B-12 (CYANOCOBALAMIN) 1000 MCG tablet [id number] Yes Take 1,000 mcg by mouth daily. [provider] Taking Active   ? ?  ?  ? ?  ? ? ?Pertinent Labs:  ?Lab Results  ?Component Value Date  ? CHOL 157 09/04/2021  ? HDL 53 09/04/2021  ? [place] 85 09/04/2021  ? TRIG 101 09/04/2021  ? CHOLHDL 3.0 09/04/2021  ? ?Lab Results  ?Component Value Date  ? CREATININE 0.86 09/04/2021  ? BUN 14 09/04/2021  ? NA 141  09/04/2021  ? K 4.4 09/04/2021  ? CL 104 09/04/2021  ? CO2 28 09/04/2021  ? ?BP Readings from Last 3 Encounters:  ?11/26/21 126/70  ?10/07/21 119/64  ?09/04/21 (!) 125/47  ? ?Pulse Readings from Last 3 Encounters:  ?11/26/21 91  ?10/07/21 86  ?09/04/21 72  ? ? ? ?SDOH:  (Social Determinants of Health) assessments and interventions performed:  ? ? ?CCM Care Plan ? ?Review of patient past medical history, allergies, medications, health status, including review of consultants reports, laboratory  and other test data, was performed as part of comprehensive evaluation and provision of chronic care management services.  ? ?Care Plan : PharmD - Medication Assistance  ?Updates made by Rennis Petty, RPH-CPP since 12/15/2021 12:00 AM  ?  ? ?Problem: Disease Progression   ?  ? ?Long-Range Goal: Disease Progression Prevented or Minimized Completed 12/15/2021  ?Start Date: 08/27/2021  ?Expected End Date: 11/25/2021  ?Recent Progress: On track  ?Priority: High  ?Note:   ?Current Barriers:  ?Unable to independently afford treatment regimen ?Reports cost of Spiriva Handihaler is unaffordable, particularly when in the coverage gap of her [employer] Advantage plan ?Patient has Partial Extra Help ?Patient APPROVED to receive Spiriva from Brooke King patient assistance program through 09/20/2022 ? ?Pharmacist Clinical Goal(s):  ?patient will verbalize ability to afford treatment regimen through collaboration with PharmD and provider.  ? ? ?Interventions: ?1:1 collaboration with Brooke Fenton, NP regarding development and update of comprehensive plan of care as evidenced by provider attestation and co-signature ?Inter-disciplinary care team collaboration (see longitudinal plan of care) ?Perform chart review. ?Patient seen for Office Visit with [place] on 3/8 for Urinary Incontinence ?Urinalysis completed ?Provider advised patient: ?Will refer to gynecology for consideration for a pessary ?Start vaginal estrogen cream 3 nights weekly ?Per telephone encounter from 3/15. Urologist advised patient: ?Urine culture was positive and she needs to start Cipro 250 mg, twice daily for seven days ?Next appointment with Urology on 4/11 ?Today patient reports complete course of ciprofloxacin as directed by Urologist and symptoms improved. ?Reports tried using estrogen cream, but did not notice improvement. Plans to discuss with provider at upcoming appointment ? ? ?COPD: ?Current  treatment: ?Albuterol inhaler - 1-2 puffs into lungs every 6 hours as needed ?Reports self-discontinued Spiriva inhaler due to side effect of headaches/burden of multiple medications ?Reports feels better now that she is off of Spiriva and is not interested in trying another maintenance inhaler at this time. ?Reports prefers to use only albuterol as needed for now, but states will discuss with PCP at next appointment ?Will collaborate with PCP to let her know ? ?Tobacco Use: ?Discuss benefits of smoking cessation ?Patient denies interest in quitting smoking at this time ? ?Hypertension: ?Current treament: valsartan 80 mg daily ?Reports last time she checked reading was "good", but unable to locate her log today. ? ?Patient Goals/Self-Care Activities ?patient will:  ?- collaborate with provider on medication access solutions ? ?  ? ?  ? ?Plan: The patient has been provided with contact information for CM Pharmacist and has been advised to call with any medication related questions or concerns.  ?No further follow up required: Patient denies need to schedule further follow up at this time ? ?Wallace Cullens, PharmD, BCACP ?Clinical Pharmacist ?[facility] ?[place] ?[phone] ? ? ? ? ? ?

## 2021-12-15 NOTE — Patient Instructions (Signed)
Visit Information ? ?Thank you for taking time to visit with me today. Please don't hesitate to contact me if I can be of assistance to you before our next scheduled telephone appointment. ? ?Following are the goals we discussed today:  ? Goals Addressed   ? ?  ?  ?  ?  ? This Visit's Progress  ?  Pharmacy Goals     ?  Feel free to call me with any questions or concerns.  ? ?Wallace Cullens, PharmD, BCACP, CPP ?Clinical Pharmacist ?[facility] ?[place] ?[phone] ?  ? ?  ? ? ?Patient verbalizes understanding of instructions and care plan provided today and agrees to view in [place]. Active MyChart status confirmed with patient.   ? ?

## 2021-12-19 ENCOUNTER — Other Ambulatory Visit: Payer: Self-pay | Admitting: Internal Medicine

## 2021-12-19 DIAGNOSIS — I10 Essential (primary) hypertension: Secondary | ICD-10-CM | POA: Diagnosis not present

## 2021-12-19 DIAGNOSIS — F17210 Nicotine dependence, cigarettes, uncomplicated: Secondary | ICD-10-CM

## 2021-12-19 DIAGNOSIS — J449 Chronic obstructive pulmonary disease, unspecified: Secondary | ICD-10-CM

## 2021-12-19 NOTE — Telephone Encounter (Signed)
Requested medication (s) are due for refill today: yes (mail order) ? ?Requested medication (s) are on the active medication list: yes ? ?Last refill:  10/24/21 24g with 1 RF ? ?Future visit scheduled: 03/05/22 ? ?Notes to clinic:  Mail order Pharm request as follows: Requesting 1 year supply ? ? ?  ? ?Requested Prescriptions  ?Pending Prescriptions Disp Refills  ? albuterol (VENTOLIN HFA) 108 (90 Base) MCG/ACT inhaler [Pharmacy Med Name: ALBUTEROL HFA 90MCG/ACT (PA)] 34 g 3  ?  Sig: USE 1 TO 2 INHALATIONS BY MOUTH  INTO THE LUNGS EVERY 6 HOURS AS  NEEDED FOR WHEEZING OR SHORTNESS OF BREATH  ?  ? Pulmonology:  Beta Agonists 2 Passed - 12/19/2021  5:17 AM  ?  ?  Passed - Last BP in normal range  ?  BP Readings from Last 1 Encounters:  ?11/26/21 126/70  ?  ?  ?  ?  Passed - Last Heart Rate in normal range  ?  Pulse Readings from Last 1 Encounters:  ?11/26/21 91  ?  ?  ?  ?  Passed - Valid encounter within last 12 months  ?  Recent Outpatient Visits   ? ?      ? 2 months ago Shortness of breath  ? [facility] [place], Coralie Keens, NP  ? 3 months ago Encounter for general adult medical examination with abnormal findings  ? [facility] [place], [place] W, NP  ? 4 months ago Thrombocytopenia [facility])  ? [facility] [place], [place] W, NP  ? 6 months ago Needs flu shot  ? [facility] [place], Coralie Keens, NP  ? 9 months ago Anxiety  ? [facility] [place], Coralie Keens, NP  ? ?  ?  ?Future Appointments   ? ?        ? In 1 week McGowan, Gordan Payment [place]  ? In 2 months McGowan, Gordan Payment [place]  ? In 2 months Baity, Coralie Keens, NP [facility], [place]  ? ?  ? ?  ?  ?  ? ? ?
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
## 2021-12-30 ENCOUNTER — Ambulatory Visit: Payer: Medicare Other | Admitting: Urology

## 2021-12-30 ENCOUNTER — Encounter: Payer: Self-pay | Admitting: Urology

## 2021-12-30 VITALS — BP 173/80 | HR 76 | Ht 63.0 in | Wt 180.0 lb

## 2021-12-30 DIAGNOSIS — N3946 Mixed incontinence: Secondary | ICD-10-CM | POA: Diagnosis not present

## 2021-12-30 DIAGNOSIS — N8111 Cystocele, midline: Secondary | ICD-10-CM

## 2021-12-30 DIAGNOSIS — R3129 Other microscopic hematuria: Secondary | ICD-10-CM | POA: Diagnosis not present

## 2021-12-30 DIAGNOSIS — N952 Postmenopausal atrophic vaginitis: Secondary | ICD-10-CM | POA: Diagnosis not present

## 2021-12-30 LAB — URINALYSIS, COMPLETE
Bilirubin, UA: NEGATIVE
Glucose, UA: NEGATIVE
Ketones, UA: NEGATIVE
Leukocytes,UA: NEGATIVE
Nitrite, UA: NEGATIVE
Protein,UA: NEGATIVE
RBC, UA: NEGATIVE
Specific Gravity, UA: <1.005 — ABNORMAL LOW (ref 1.005–1.030)
Urobilinogen, Ur: 0.2 mg/dL (ref 0.2–1.0)
pH, UA: 6 (ref 5.0–7.5)

## 2021-12-30 LAB — MICROSCOPIC EXAMINATION
Bacteria, UA: NONE SEEN
Epithelial Cells (non renal): NONE SEEN /hpf (ref 0–10)

## 2021-12-30 LAB — BLADDER SCAN AMB NON-IMAGING

## 2021-12-30 MED ORDER — MIRABEGRON ER 50 MG PO TB24: 50.0000 mg | ORAL_TABLET | Freq: Every day | ORAL | 0 refills | Status: DC

## 2022-01-15 ENCOUNTER — Other Ambulatory Visit: Payer: Self-pay

## 2022-01-15 MED ORDER — OMEPRAZOLE 20 MG PO CPDR: 20.0000 mg | DELAYED_RELEASE_CAPSULE | Freq: Every day | ORAL | 1 refills | Status: DC

## 2022-01-15 MED ORDER — MONTELUKAST SODIUM 10 MG PO TABS
10.0000 mg | ORAL_TABLET | Freq: Every day | ORAL | 1 refills | Status: DC
Start: 2022-01-15 — End: 2022-08-10

## 2022-01-15 MED ORDER — VALSARTAN 80 MG PO TABS: 80.0000 mg | ORAL_TABLET | Freq: Every day | ORAL | 1 refills | Status: DC

## 2022-01-16 DIAGNOSIS — H401112 Primary open-angle glaucoma, right eye, moderate stage: Secondary | ICD-10-CM | POA: Diagnosis not present

## 2022-01-16 LAB — HM DIABETES EYE EXAM

## 2022-01-28 ENCOUNTER — Telehealth: Payer: Self-pay

## 2022-01-28 DIAGNOSIS — N3946 Mixed incontinence: Secondary | ICD-10-CM

## 2022-01-28 MED ORDER — MIRABEGRON ER 50 MG PO TB24: 50.0000 mg | ORAL_TABLET | Freq: Every day | ORAL | 2 refills | Status: DC

## 2022-01-28 NOTE — Telephone Encounter (Signed)
Incoming call on triage line from pt in regards to [facility] Rx. Pt states the medication is working and she would like an Rx sent to [facility]. Pt due to see Larene Beach 03/31/22 for follow up. Myrbetriq sent.  ?

## 2022-02-02 ENCOUNTER — Other Ambulatory Visit: Payer: Self-pay | Admitting: Internal Medicine

## 2022-02-02 DIAGNOSIS — E039 Hypothyroidism, unspecified: Secondary | ICD-10-CM

## 2022-02-04 NOTE — Telephone Encounter (Signed)
Requested Prescriptions  ?Pending Prescriptions Disp Refills  ?? SYNTHROID 112 MCG tablet [Pharmacy Med Name: SYNTHROID  112MCG  TAB] 90 tablet 2  ?  Sig: TAKE 1 TABLET BY MOUTH  DAILY BEFORE BREAKFAST  ?  ? Endocrinology:  Hypothyroid Agents Passed - 02/02/2022 10:31 PM  ?  ?  Passed - TSH in normal range and within 360 days  ?  TSH  ?Date Value Ref Range Status  ?09/04/2021 1.64 0.40 - 4.50 mIU/L Final  ?   ?  ?  Passed - Valid encounter within last 12 months  ?  Recent Outpatient Visits   ?      ? 4 months ago Shortness of breath  ? [facility] [place], Coralie Keens, NP  ? 5 months ago Encounter for general adult medical examination with abnormal findings  ? [facility] [place], [place] W, NP  ? 5 months ago Thrombocytopenia [facility])  ? [facility] [place], [place] W, NP  ? 7 months ago Needs flu shot  ? [facility] [place], Coralie Keens, NP  ? 11 months ago Anxiety  ? [facility] [place], Coralie Keens, NP  ?  ?  ?Future Appointments   ?        ? In 3 weeks McGowan, Gordan Payment [place]  ? In 4 weeks Baity, Coralie Keens, NP [facility] [place]  ?  ? ?  ?  ?  ? ?

## 2022-02-06 ENCOUNTER — Encounter: Payer: Self-pay | Admitting: Internal Medicine

## 2022-02-06 ENCOUNTER — Ambulatory Visit: Payer: Self-pay

## 2022-02-06 ENCOUNTER — Ambulatory Visit (INDEPENDENT_AMBULATORY_CARE_PROVIDER_SITE_OTHER): Payer: Medicare Other | Admitting: Internal Medicine

## 2022-02-06 VITALS — BP 132/70 | HR 90 | Temp 97.1°F | Wt 179.0 lb

## 2022-02-06 DIAGNOSIS — T50905A Adverse effect of unspecified drugs, medicaments and biological substances, initial encounter: Secondary | ICD-10-CM

## 2022-02-06 DIAGNOSIS — R21 Rash and other nonspecific skin eruption: Secondary | ICD-10-CM | POA: Diagnosis not present

## 2022-02-06 DIAGNOSIS — R109 Unspecified abdominal pain: Secondary | ICD-10-CM

## 2022-02-06 DIAGNOSIS — R140 Abdominal distension (gaseous): Secondary | ICD-10-CM | POA: Diagnosis not present

## 2022-02-06 DIAGNOSIS — R197 Diarrhea, unspecified: Secondary | ICD-10-CM | POA: Diagnosis not present

## 2022-02-06 MED ORDER — TRIAMCINOLONE ACETONIDE 0.1 % EX CREA: 1.0000 "application " | TOPICAL_CREAM | Freq: Two times a day (BID) | CUTANEOUS | 0 refills | Status: DC

## 2022-02-06 NOTE — Patient Instructions (Signed)
Diarrhea, Adult ?Diarrhea is when you pass loose and watery poop (stool) often. Diarrhea can make you feel weak and cause you to lose water in your body (get dehydrated). Losing water in your body can cause you to: ?Feel tired and thirsty. ?Have a dry mouth. ?Go pee (urinate) less often. ?Diarrhea often lasts 2-3 days. However, it can last longer if it is a sign of something more serious. It is important to treat your diarrhea as told by your doctor. ?Follow these instructions at home: ?Eating and drinking ? ?  ? ?Follow these instructions as told by your doctor: ?Take an ORS (oral rehydration solution). This is a drink that helps you replace fluids and minerals your body lost. It is sold at pharmacies and stores. ?Drink plenty of fluids, such as: ?Water. ?Ice chips. ?Diluted fruit juice. ?Low-calorie sports drinks. ?Milk, if you want. ?Avoid drinking fluids that have a lot of sugar or caffeine in them. ?Eat bland, easy-to-digest foods in small amounts as you are able. These foods include: ?Bananas. ?Applesauce. ?Rice. ?Low-fat (lean) meats. ?Toast. ?Crackers. ?Avoid alcohol. ?Avoid spicy or fatty foods. ? ?Medicines ?Take over-the-counter and prescription medicines only as told by your doctor. ?If you were prescribed an antibiotic medicine, take it as told by your doctor. Do not stop using the antibiotic even if you start to feel better. ?General instructions ? ?Wash your hands often using soap and water. If soap and water are not available, use a hand sanitizer. Others in your home should wash their hands as well. Hands should be washed: ?After using the toilet or changing a diaper. ?Before preparing, cooking, or serving food. ?While caring for a sick person. ?While visiting someone in a hospital. ?Drink enough fluid to keep your pee (urine) pale yellow. ?Rest at home while you get better. ?Take a warm bath to help with any burning or pain from having diarrhea. ?Watch your condition for any changes. ?Keep all  follow-up visits as told by your doctor. This is important. ?Contact a doctor if: ?You have a fever. ?Your diarrhea gets worse. ?You have new symptoms. ?You cannot keep fluids down. ?You feel light-headed or dizzy. ?You have a headache. ?You have muscle cramps. ?Get help right away if: ?You have chest pain. ?You feel very weak or you pass out (faint). ?You have bloody or black poop or poop that looks like tar. ?You have very bad pain, cramping, or bloating in your belly (abdomen). ?You have trouble breathing or you are breathing very quickly. ?Your heart is beating very quickly. ?Your skin feels cold and clammy. ?You feel confused. ?You have signs of losing too much water in your body, such as: ?Dark pee, very little pee, or no pee. ?Cracked lips. ?Dry mouth. ?Sunken eyes. ?Sleepiness. ?Weakness. ?Summary ?Diarrhea is when you pass loose and watery poop (stool) often. ?Diarrhea can make you feel weak and cause you to lose water in your body (get dehydrated). ?Take an ORS (oral rehydration solution). This is a drink that is sold at pharmacies and stores. ?Eat bland, easy-to-digest foods in small amounts as you are able. ?Contact a doctor if your condition gets worse. Get help right away if you have signs that you have lost too much water in your body. ?This information is not intended to replace advice given to you by your health care provider. Make sure you discuss any questions you have with your health care provider. ?Document Revised: 03/19/2021 Document Reviewed: 03/19/2021 ?Elsevier Patient Education ? 2023 [employer] ? ?

## 2022-02-06 NOTE — Progress Notes (Signed)
Subjective:    Patient ID: Brooke King, female    DOB: 1941/07/29, 81 y.o.   MRN: [id number]  HPI  Pt presents to the clinic today with c/o abdominal pain, bloating and diarrhea. This started 1 week ago. She reports the stool is watery and happens after she eats. She is having 4-5 times per day. She reports the abdominal pain feels like cramping, and improves after having a BM. She denies recent changes in diet. She reports she started Myrbetriq around the same time that the symptoms started.  She has not tried anything OTC for this.  She also reports a rash on her face.  She noticed this last night.  The rash does not itch, burn or tingle.  It has not seemed to spread.  She denies changes in soaps, lotions or detergents.  She reports she does have a dog that is outdoors and indoors.  She has not put anything OTC for this.  Review of Systems  Past Medical History:  Diagnosis Date   Allergy    B12 deficiency    Chest pain syndrome    COPD (chronic obstructive pulmonary disease) (HCC)    Depression    GERD (gastroesophageal reflux disease)    Glaucoma    History of colonic polyps    History of degenerative disc disease    c-spine, status post cervical laminectomy   Hyperlipidemia    Hypertension    Hypothyroidism    Insomnia    Migraine    Restless leg syndrome    Seasonal allergies    Sleep apnea    Thyroid cancer (HCC)    Urinary incontinence     Current Outpatient Medications  Medication Sig Dispense Refill   acetaminophen (TYLENOL) 500 MG tablet Take 500 mg by mouth every 6 (six) hours as needed.     albuterol (VENTOLIN HFA) 108 (90 Base) MCG/ACT inhaler USE 1 TO 2 INHALATIONS BY MOUTH  INTO THE LUNGS EVERY 6 HOURS AS  NEEDED FOR WHEEZING OR SHORTNESS OF BREATH 34 g 0   atorvastatin (LIPITOR) 10 MG tablet Take 1 tablet (10 mg total) by mouth daily. 90 tablet 3   Calcium Carb-Cholecalciferol (CALCIUM + D3) 600-200 MG-UNIT TABS Take by mouth.     conjugated estrogens  (PREMARIN) vaginal cream Apply 0.'5mg'$  (pea-sized amount)  just inside the vaginal introitus with a finger-tip on  Monday, Wednesday and Friday nights. (Patient not taking: Reported on 12/15/2021) 30 g 12   Cranberry 180 MG CAPS Take by mouth.     fexofenadine (ALLEGRA) 180 MG tablet Take 180 mg by mouth daily.     gabapentin (NEURONTIN) 300 MG capsule TAKE 1 CAPSULE BY MOUTH 3  TIMES DAILY 270 capsule 3   gentamicin ointment (GARAMYCIN) 0.1 % Apply topically.     hydrOXYzine (ATARAX/VISTARIL) 10 MG tablet Take 1 tablet (10 mg total) by mouth daily as needed. 30 tablet 0   ipratropium (ATROVENT) 0.03 % nasal spray Place into both nostrils.     latanoprost (XALATAN) 0.005 % ophthalmic solution Place 1 drop into both eyes at bedtime.     mirabegron ER (MYRBETRIQ) 50 MG TB24 tablet Take 1 tablet (50 mg total) by mouth daily. 30 tablet 2   montelukast (SINGULAIR) 10 MG tablet Take 1 tablet (10 mg total) by mouth at bedtime. 90 tablet 1   omeprazole (PRILOSEC) 20 MG capsule Take 1 capsule (20 mg total) by mouth daily. 90 capsule 1   SYNTHROID 112 MCG tablet TAKE 1 TABLET  BY MOUTH  DAILY BEFORE BREAKFAST 90 tablet 2   tiotropium (SPIRIVA) 18 MCG inhalation capsule Place 18 mcg into inhaler and inhale daily. (Patient not taking: Reported on 12/15/2021)     traZODone (DESYREL) 50 MG tablet Take 0.5 tablets (25 mg total) by mouth at bedtime as needed for sleep. 45 tablet 0   valsartan (DIOVAN) 80 MG tablet Take 1 tablet (80 mg total) by mouth daily. 90 tablet 1   vitamin B-12 (CYANOCOBALAMIN) 1000 MCG tablet Take 1,000 mcg by mouth daily.     No current facility-administered medications for this visit.    Allergies  Allergen Reactions   Toviaz [Fesoterodine Fumarate Er] Other (See Comments)    Throat closed   Cortisone    Doxycycline Hyclate    Hydrocodone    Keflex [Cephalexin]    Septra [Sulfamethoxazole-Trimethoprim]    Tagamet [Cimetidine]    Zoloft [Sertraline]    Penicillins Rash    Family  History  Problem Relation Age of Onset   Heart disease Mother    Breast cancer Neg Hx     Social History   Socioeconomic History   Marital status: Widowed    Spouse name: Not on file   Number of children: Not on file   Years of education: Not on file   Highest education level: Not on file  Occupational History   Occupation: Retired  Tobacco Use   Smoking status: Every Day    Packs/day: 1.00    Years: 40.00    Pack years: 40.00    Types: Cigarettes   Smokeless tobacco: Never  Vaping Use   Vaping Use: Never used  Substance and Sexual Activity   Alcohol use: No   Drug use: No   Sexual activity: Not on file  Other Topics Concern   Not on file  Social History Narrative   Not on file   Social Determinants of Health   Financial Resource Strain: Not on file  Food Insecurity: Not on file  Transportation Needs: Not on file  Physical Activity: Not on file  Stress: Not on file  Social Connections: Not on file  Intimate Partner Violence: Not on file     Constitutional: Denies fever, malaise, fatigue, headache or abrupt weight changes.  HEENT: Denies eye pain, eye redness, ear pain, ringing in the ears, wax buildup, runny nose, nasal congestion, bloody nose, or sore throat. Respiratory: Denies difficulty breathing, shortness of breath, cough or sputum production.   Cardiovascular: Denies chest pain, chest tightness, palpitations or swelling in the hands or feet.  Gastrointestinal: Pt reports abdominal pain, bloating and diarrhea. Denies constipation, or blood in the stool.  Skin: Pt reports rash of face. Denies ulcercations.  N No other specific complaints in a complete review of systems (except as listed in HPI above).     Objective:   Physical Exam  BP 132/70 (BP Location: Right Arm, Patient Position: Sitting, Cuff Size: Large)   Pulse 90   Temp (!) 97.1 F (36.2 C) (Temporal)   Wt 179 lb (81.2 kg)   SpO2 99%   BMI 31.71 kg/m   Wt Readings from Last 3  Encounters:  12/30/21 180 lb (81.6 kg)  11/26/21 180 lb 6.4 oz (81.8 kg)  10/07/21 181 lb (82.1 kg)    General: Appears her stated age, obese, in NAD. Skin: Grouped, papular lesions noted of the left cheek. HEENT: Head: normal shape and size; Eyes: sclera Raynor, no icterus, conjunctiva pink, PERRLA and EOMs intact;  Cardiovascular: Normal rate  and rhythm. S1,S2 noted.  No murmur, rubs or gallops noted.  Pulmonary/Chest: Normal effort and positive vesicular breath sounds. No respiratory distress. No wheezes, rales or ronchi noted.  Abdomen: Soft and nontender. Normal bowel sounds. No distention or masses noted.  Musculoskeletal: No difficulty with gait.  Neurological: Alert and oriented.    BMET    Component Value Date/Time   NA 141 09/04/2021 1021   K 4.4 09/04/2021 1021   K 3.8 12/15/2013 1202   CL 104 09/04/2021 1021   CO2 28 09/04/2021 1021   GLUCOSE 129 09/04/2021 1021   BUN 14 09/04/2021 1021   CREATININE 0.86 09/04/2021 1021   CALCIUM 9.7 09/04/2021 1021   GFRNONAA >60 01/19/2012 0949   GFRAA >60 01/19/2012 0949    Lipid Panel     Component Value Date/Time   CHOL 157 09/04/2021 1021   TRIG 101 09/04/2021 1021   HDL 53 09/04/2021 1021   CHOLHDL 3.0 09/04/2021 1021   LDLCALC 85 09/04/2021 1021    CBC    Component Value Date/Time   WBC 6.6 09/04/2021 1021   RBC 4.70 09/04/2021 1021   HGB 14.4 09/04/2021 1021   HCT 43.4 09/04/2021 1021   PLT 135 (L) 09/04/2021 1021   MCV 92.3 09/04/2021 1021   MCH 30.6 09/04/2021 1021   MCHC 33.2 09/04/2021 1021   RDW 12.6 09/04/2021 1021   LYMPHSABS 1,976 10/29/2020 1125   EOSABS 397 10/29/2020 1125   BASOSABS 89 10/29/2020 1125    Hgb A1C Lab Results  Component Value Date   HGBA1C 6.4 (H) 09/04/2021           Assessment & Plan:   Rash of Face:  Could be contact/allergic dermatitis Not entirely consistent with shingles Rx for Triamcinolone cream 0.1% twice daily as needed  Abdominal Cramping, Bloating  and Diarrhea:  I am concerned that this could be a medication reaction to Myrbetriq which she started just prior to symptom onset We will have her hold this for 3 days and see if symptoms improved She can take Imodium OTC as needed however we will not know if it is holding the Myrbetriq or the Imodium that improves her symptoms so she will try to hold off on this Advised her to go to urgent care over the weekend if symptoms seem to worsen  She will plan to update me on Monday and let me know how she is doing  Webb Silversmith, NP

## 2022-02-06 NOTE — Telephone Encounter (Signed)
    Chief Complaint: Diarrhea, eating and drinking , but then has diarrhea after she eats. Symptoms: Watery Frequency: Saturday Pertinent Negatives: Patient denies fever Disposition: '[]'$ ED /'[]'$ Urgent Care (no appt availability in office) / '[x]'$ Appointment(In office/virtual)/ '[]'$  [facility]/ '[]'$ Home Care/ '[]'$ Refused Recommended Disposition /'[]'$  Mobile Bus/ '[]'$  Follow-up with PCP Additional Notes: States has a few "red spots on my face as well."  Answer Assessment - Initial Assessment Questions 1. DIARRHEA SEVERITY: "How bad is the diarrhea?" "How many more stools have you had in the past 24 hours than normal?"    - NO DIARRHEA (SCALE 0)   - MILD (SCALE 1-3): Few loose or mushy BMs; increase of 1-3 stools over normal daily number of stools; mild increase in ostomy output.   -  MODERATE (SCALE 4-7): Increase of 4-6 stools daily over normal; moderate increase in ostomy output. * SEVERE (SCALE 8-10; OR 'WORST POSSIBLE'): Increase of 7 or more stools daily over normal; moderate increase in ostomy output; incontinence.     3 2. ONSET: "When did the diarrhea begin?"      Saturday 3. BM CONSISTENCY: "How loose or watery is the diarrhea?"      Watery 4. VOMITING: "Are you also vomiting?" If Yes, ask: "How many times in the past 24 hours?"      No 5. ABDOMINAL PAIN: "Are you having any abdominal pain?" If Yes, ask: "What does it feel like?" (e.g., crampy, dull, intermittent, constant)      Cramps 6. ABDOMINAL PAIN SEVERITY: If present, ask: "How bad is the pain?"  (e.g., Scale 1-10; mild, moderate, or severe)   - MILD (1-3): doesn't interfere with normal activities, abdomen soft and not tender to touch    - MODERATE (4-7): interferes with normal activities or awakens from sleep, abdomen tender to touch    - SEVERE (8-10): excruciating pain, doubled over, unable to do any normal activities       Mild 7. ORAL INTAKE: If vomiting, "Have you been able to drink liquids?" "How much  liquids have you had in the past 24 hours?"     Yes 8. HYDRATION: "Any signs of dehydration?" (e.g., dry mouth [not just dry lips], too weak to stand, dizziness, new weight loss) "When did you last urinate?"     No 9. EXPOSURE: "Have you traveled to a foreign country recently?" "Have you been exposed to anyone with diarrhea?" "Could you have eaten any food that was spoiled?"     No 10. ANTIBIOTIC USE: "Are you taking antibiotics now or have you taken antibiotics in the past 2 months?"       No 11. OTHER SYMPTOMS: "Do you have any other symptoms?" (e.g., fever, blood in stool)       No 12. PREGNANCY: "Is there any chance you are pregnant?" "When was your last menstrual period?"       No  Protocols used: [facility]

## 2022-02-09 ENCOUNTER — Ambulatory Visit: Payer: Self-pay | Admitting: *Deleted

## 2022-02-09 DIAGNOSIS — R197 Diarrhea, unspecified: Secondary | ICD-10-CM

## 2022-02-09 NOTE — Telephone Encounter (Signed)
Chief Complaint: diarrhea Symptoms: abdominal pain, watery stools Frequency: 4-5 watery stools within the last 24 hours Pertinent Negatives: Patient denies dizziness.  Disposition: '[]'$ ED /'[]'$ Urgent Care (no appt availability in office) / '[]'$ Appointment(In office/virtual)/ '[]'$  [facility]/ '[]'$ Home Care/ '[]'$ Refused Recommended Disposition /'[]'$  Mobile Bus/ '[x]'$  Follow-up with PCP Additional Notes: Patient was seen for office visit on 5/19 and was told to call to follow-up on symptoms this morning. Patient has stopped Myrbetriq but is still having watery diarrhea. In the past 24 hours, patient reports  4-5 watery stools.  She has taken Imodium this morning around 7:30 am and has had a stool since then. Patient states she has not been eating much but is still able to drink.  Patient can be called with provider recommendations at [phone].   Reason for Disposition  [1] MODERATE diarrhea (e.g., 4-6 times / day more than normal) AND [2] present > 48 hours (2 days)  Answer Assessment - Initial Assessment Questions 1. DIARRHEA SEVERITY: "How bad is the diarrhea?" "How many more stools have you had in the past 24 hours than normal?"    - NO DIARRHEA (SCALE 0)   - MILD (SCALE 1-3): Few loose or mushy BMs; increase of 1-3 stools over normal daily number of stools; mild increase in ostomy output.   -  MODERATE (SCALE 4-7): Increase of 4-6 stools daily over normal; moderate increase in ostomy output. * SEVERE (SCALE 8-10; OR 'WORST POSSIBLE'): Increase of 7 or more stools daily over normal; moderate increase in ostomy output; incontinence.     4-5 bowel movements in the last 24 hours 2. ONSET: "When did the diarrhea begin?"      Patient states that she had a bowel 3. BM CONSISTENCY: "How loose or watery is the diarrhea?"      watery 4. VOMITING: "Are you also vomiting?" If Yes, ask: "How many times in the past 24 hours?"      no 5. ABDOMINAL PAIN: "Are you having any abdominal pain?"  If Yes, ask: "What does it feel like?" (e.g., crampy, dull, intermittent, constant)      aching 6. ABDOMINAL PAIN SEVERITY: If present, ask: "How bad is the pain?"  (e.g., Scale 1-10; mild, moderate, or severe)   - MILD (1-3): doesn't interfere with normal activities, abdomen soft and not tender to touch    - MODERATE (4-7): interferes with normal activities or awakens from sleep, abdomen tender to touch    - SEVERE (8-10): excruciating pain, doubled over, unable to do any normal activities       8-10 7. ORAL INTAKE: If vomiting, "Have you been able to drink liquids?" "How much liquids have you had in the past 24 hours?"     Unable to drink this morning2 8. HYDRATION: "Any signs of dehydration?" (e.g., dry mouth [not just dry lips], too weak to stand, dizziness, new weight loss) "When did you last urinate?"     Denies dizziness 9. EXPOSURE: "Have you traveled to a foreign country recently?" "Have you been exposed to anyone with diarrhea?" "Could you have eaten any food that was spoiled?"     no 10. ANTIBIOTIC USE: "Are you taking antibiotics now or have you taken antibiotics in the past 2 months?"       N/a 11. OTHER SYMPTOMS: "Do you have any other symptoms?" (e.g., fever, blood in stool)       N/a 12. PREGNANCY: "Is there any chance you are pregnant?" "When was your last menstrual period?"  N/a  Protocols used: [facility]

## 2022-02-10 ENCOUNTER — Other Ambulatory Visit: Payer: Medicare Other

## 2022-02-10 DIAGNOSIS — R197 Diarrhea, unspecified: Secondary | ICD-10-CM

## 2022-02-10 NOTE — Telephone Encounter (Signed)
Okay to resume Myrbetriq.  Have her schedule a lab appointment today to have some blood work and a stool sample taken.  I would like her to continue with the Imodium 2 times daily for now until labs are reviewed.

## 2022-02-10 NOTE — Telephone Encounter (Signed)
Lab appointment 1:30 today.   Thanks,   -Mickel Baas

## 2022-02-10 NOTE — Addendum Note (Signed)
Addended by: Jearld Fenton on: 02/10/2022 08:36 AM   Modules accepted: Orders

## 2022-02-11 DIAGNOSIS — R197 Diarrhea, unspecified: Secondary | ICD-10-CM | POA: Diagnosis not present

## 2022-02-11 LAB — CBC WITH DIFFERENTIAL/PLATELET
Absolute Monocytes: 581 cells/uL (ref 200–950)
Basophils Absolute: 91 cells/uL (ref 0–200)
Basophils Relative: 1.3 %
Eosinophils Absolute: 392 cells/uL (ref 15–500)
Eosinophils Relative: 5.6 %
HCT: 40.7 % (ref 35.0–45.0)
Hemoglobin: 13.8 g/dL (ref 11.7–15.5)
Lymphs Abs: 2310 cells/uL (ref 850–3900)
MCH: 31.3 pg (ref 27.0–33.0)
MCHC: 33.9 g/dL (ref 32.0–36.0)
MCV: 92.3 fL (ref 80.0–100.0)
MPV: 13.5 fL — ABNORMAL HIGH (ref 7.5–12.5)
Monocytes Relative: 8.3 %
Neutro Abs: 3626 cells/uL (ref 1500–7800)
Neutrophils Relative %: 51.8 %
Platelets: 140 10*3/uL (ref 140–400)
RBC: 4.41 10*6/uL (ref 3.80–5.10)
RDW: 12.4 % (ref 11.0–15.0)
Total Lymphocyte: 33 %
WBC: 7 10*3/uL (ref 3.8–10.8)

## 2022-02-11 LAB — COMPLETE METABOLIC PANEL WITH GFR
AG Ratio: 1.6 (calc) (ref 1.0–2.5)
ALT: 13 U/L (ref 6–29)
AST: 19 U/L (ref 10–35)
Albumin: 4.3 g/dL (ref 3.6–5.1)
Alkaline phosphatase (APISO): 72 U/L (ref 37–153)
BUN/Creatinine Ratio: 13 (calc) (ref 6–22)
BUN: 13 mg/dL (ref 7–25)
CO2: 27 mmol/L (ref 20–32)
Calcium: 9.3 mg/dL (ref 8.6–10.4)
Chloride: 106 mmol/L (ref 98–110)
Creat: 0.97 mg/dL — ABNORMAL HIGH (ref 0.60–0.95)
Globulin: 2.7 g/dL (calc) (ref 1.9–3.7)
Glucose, Bld: 92 mg/dL (ref 65–139)
Potassium: 3.9 mmol/L (ref 3.5–5.3)
Sodium: 142 mmol/L (ref 135–146)
Total Bilirubin: 0.4 mg/dL (ref 0.2–1.2)
Total Protein: 7 g/dL (ref 6.1–8.1)
eGFR: 59 mL/min/{1.73_m2} — ABNORMAL LOW (ref >=60)

## 2022-02-11 LAB — TSH: TSH: 0.99 mIU/L (ref 0.40–4.50)

## 2022-02-13 LAB — GI PATHOGEN PANEL BY PCR, STOOL
Adenovirus F40/41: NEGATIVE
Astrovirus: NEGATIVE
C.DIFFICILE TOXIN: POSITIVE
CRYPTOSPORIDIUM SPECIES: NEGATIVE
Campylobacter species: NEGATIVE
Cyclospora cayetanensis: NEGATIVE
ENTEROAGGREGATIVE E. COLI (EAEC): NEGATIVE
ENTEROPATHOGENIC E. COLI (EPEC): NEGATIVE
ENTEROTOXIGENIC E. COLI (ETEC): NEGATIVE
Entamoeba histolytica: NEGATIVE
GIARDIA: NEGATIVE
Norovirus GI/GII: NEGATIVE
Plesiomonas shigelloides: NEGATIVE
ROTAVIRUS: NEGATIVE
SHIGA TOXIN PRODUCING E. COLI: NEGATIVE
SHIGELLA/ENTEROINVASIVE E. COLI: NEGATIVE
Salmonella species: NEGATIVE
Sapovirus: NEGATIVE
Vibrio cholerae: NEGATIVE
Vibrio species: NEGATIVE
YERSINIA SPECIES: NEGATIVE

## 2022-02-20 DIAGNOSIS — G4733 Obstructive sleep apnea (adult) (pediatric): Secondary | ICD-10-CM | POA: Diagnosis not present

## 2022-02-26 ENCOUNTER — Encounter: Payer: Self-pay | Admitting: Urology

## 2022-02-26 ENCOUNTER — Ambulatory Visit: Payer: Medicare Other | Admitting: Urology

## 2022-02-26 VITALS — BP 135/77 | HR 78 | Ht 63.0 in | Wt 179.0 lb

## 2022-02-26 DIAGNOSIS — N8111 Cystocele, midline: Secondary | ICD-10-CM

## 2022-02-26 DIAGNOSIS — N952 Postmenopausal atrophic vaginitis: Secondary | ICD-10-CM

## 2022-02-26 DIAGNOSIS — N3946 Mixed incontinence: Secondary | ICD-10-CM

## 2022-02-26 LAB — BLADDER SCAN AMB NON-IMAGING

## 2022-02-26 NOTE — Progress Notes (Signed)
02/26/22 11:55 AM   Brooke King 1940-10-21 [id number]  Referring provider:  Jearld Fenton, NP [address] [place],  [place] [address]  Urological history  1. Incontinence -contributing factors of obesity, vaginal delivery, age, smoking, vaginal atrophy and sleep apnea -PVR 0 ml - She was given a trial of Myrbetriq in 12/2021  2. 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 Vaginal atrophy  - Unable to tolerate vaginal estrogen cream   4. Cystocele  - Referred to gynecology to discuss pessary   HPI: Brooke King is a 81 y.o.female who presents today for a 3 month follow-up with OAB questionnaire and PVR.   She has 8 or more daytime urination's, 1-2 nighttime urination's, strong urge to urinate,mixed incontinence leaking 1-2x daily she is wearing 4 absorbant pads daily and she engages in toilet mapping   She reports that she has improvement in her urinary frequency on Myrbetriq.   Patient denies any modifying or aggravating factors.  Patient denies any gross hematuria, dysuria or suprapubic/flank pain.  Patient denies any fevers, chills, nausea or vomiting.    She has burning and itching in her vaginal area.  PMH: Past Medical History:  Diagnosis Date   Allergy    B12 deficiency    Chest pain syndrome    COPD (chronic obstructive pulmonary disease) (HCC)    Depression    GERD (gastroesophageal reflux disease)    Glaucoma    History of colonic polyps    History of degenerative disc disease    c-spine, status post cervical laminectomy   Hyperlipidemia    Hypertension    Hypothyroidism    Insomnia    Migraine    Restless leg syndrome    Seasonal allergies    Sleep apnea    Thyroid cancer ([place])    Urinary incontinence     Surgical History: Past Surgical History:  Procedure Laterality Date   ABDOMINAL HYSTERECTOMY     CATARACT EXTRACTION, BILATERAL     CERVICAL LAMINECTOMY  08/2004   CHOLECYSTECTOMY     COLONOSCOPY WITH PROPOFOL N/A 11/18/2015   Procedure:  COLONOSCOPY WITH PROPOFOL;  Surgeon: Hulen Luster, MD;  Location: [facility] ENDOSCOPY;  Service: Gastroenterology;  Laterality: N/A;   ESOPHAGOGASTRODUODENOSCOPY (EGD) WITH PROPOFOL N/A 11/18/2015   Procedure: ESOPHAGOGASTRODUODENOSCOPY (EGD) WITH PROPOFOL;  Surgeon: Hulen Luster, MD;  Location: [facility] ENDOSCOPY;  Service: Gastroenterology;  Laterality: N/A;   HEMORRHOID SURGERY     TUBAL LIGATION      Home Medications:  Allergies as of 02/26/2022       Reactions   Toviaz [fesoterodine Fumarate Er] Other (See Comments)   Throat closed   Cortisone    Doxycycline Hyclate    Hydrocodone    Keflex [cephalexin]    Septra [sulfamethoxazole-trimethoprim]    Tagamet [cimetidine]    Zoloft [sertraline]    Penicillins Rash        Medication List        Accurate as of February 26, 2022 11:55 AM. If you have any questions, ask your nurse or doctor.          acetaminophen 500 MG tablet Commonly known as: TYLENOL Take 500 mg by mouth every 6 (six) hours as needed.   albuterol 108 (90 Base) MCG/ACT inhaler Commonly known as: VENTOLIN HFA USE 1 TO 2 INHALATIONS BY MOUTH  INTO THE LUNGS EVERY 6 HOURS AS  NEEDED FOR WHEEZING OR SHORTNESS OF BREATH   atorvastatin 10 MG tablet Commonly known as:  LIPITOR Take 1 tablet (10 mg total) by mouth daily.   Calcium + D3 600-200 MG-UNIT Tabs Take by mouth.   Cranberry 180 MG Caps Take by mouth.   fexofenadine 180 MG tablet Commonly known as: ALLEGRA Take 180 mg by mouth daily.   gabapentin 300 MG capsule Commonly known as: NEURONTIN TAKE 1 CAPSULE BY MOUTH 3  TIMES DAILY   hydrOXYzine 10 MG tablet Commonly known as: ATARAX Take 1 tablet (10 mg total) by mouth daily as needed.   ipratropium 0.03 % nasal spray Commonly known as: ATROVENT Place into both nostrils.   latanoprost 0.005 % ophthalmic solution Commonly known as: XALATAN Place 1 drop into both eyes at bedtime.   mirabegron ER 50 MG Tb24 tablet Commonly known as: MYRBETRIQ Take 1  tablet (50 mg total) by mouth daily.   montelukast 10 MG tablet Commonly known as: SINGULAIR Take 1 tablet (10 mg total) by mouth at bedtime.   omeprazole 20 MG capsule Commonly known as: PRILOSEC Take 1 capsule (20 mg total) by mouth daily.   Premarin vaginal cream Generic drug: conjugated estrogens Apply 0.'5mg'$  (pea-sized amount)  just inside the vaginal introitus with a finger-tip on  Monday, Wednesday and Friday nights.   Synthroid 112 MCG tablet Generic drug: levothyroxine TAKE 1 TABLET BY MOUTH  DAILY BEFORE BREAKFAST   tiotropium 18 MCG inhalation capsule Commonly known as: SPIRIVA Place 18 mcg into inhaler and inhale daily.   traZODone 50 MG tablet Commonly known as: DESYREL Take 0.5 tablets (25 mg total) by mouth at bedtime as needed for sleep.   triamcinolone cream 0.1 % Commonly known as: KENALOG Apply 1 application. topically 2 (two) times daily.   valsartan 80 MG tablet Commonly known as: DIOVAN Take 1 tablet (80 mg total) by mouth daily.   vitamin B-12 1000 MCG tablet Commonly known as: CYANOCOBALAMIN Take 1,000 mcg by mouth daily.        Allergies:  Allergies  Allergen Reactions   Toviaz [Fesoterodine Fumarate Er] Other (See Comments)    Throat closed   Cortisone    Doxycycline Hyclate    Hydrocodone    Keflex [Cephalexin]    Septra [Sulfamethoxazole-Trimethoprim]    Tagamet [Cimetidine]    Zoloft [Sertraline]    Penicillins Rash    Family History: Family History  Problem Relation Age of Onset   Heart disease Mother    Breast cancer Neg Hx     Social History:  reports that she has been smoking cigarettes. She has a 40.00 pack-year smoking history. She has never used smokeless tobacco. She reports that she does not drink alcohol and does not use drugs.   Physical Exam: BP 135/77   Pulse 78   Ht '5\' 3"'$  (1.6 m)   Wt 179 lb (81.2 kg)   BMI 31.71 kg/m   Constitutional:  Well nourished. Alert and oriented, No acute distress. HEENT: [place]  AT, moist mucus membranes.  Trachea midline Cardiovascular: No clubbing, cyanosis, or edema. Respiratory: Normal respiratory effort, no increased work of breathing. Neurologic: Grossly intact, no focal deficits, moving all 4 extremities. Psychiatric: Normal mood and affect.    Laboratory Data: Lab Results  Component Value Date   CREATININE 0.97 (H) 02/10/2022   Lab Results  Component Value Date   HGBA1C 6.4 (H) 09/04/2021      Latest Ref Rng & Units 02/10/2022    1:33 PM 09/04/2021   10:21 AM 08/19/2021    1:30 PM  CBC  WBC 3.8 - 10.8  Thousand/uL 7.0  6.6  8.5   Hemoglobin 11.7 - 15.5 g/dL 13.8  14.4  14.2   Hematocrit 35.0 - 45.0 % 40.7  43.4  42.9   Platelets 140 - 400 Thousand/uL 140  135  150   I have reviewed the labs.   Pertinent Imaging: Results for orders placed or performed in visit on 02/26/22  Bladder Scan (Post Void Residual) in office  Result Value Ref Range   Scan Result 54m     Assessment & Plan:    1. Mixed Incontinence - She is emptying adequately with PVR of 046m- She has seen improvement on Myrbetriq 50 mg daily  - Continue this medication  - will reassess in three months  2. Cystocele  - grade II cystocele and rectocele seen on 11/2021 exam  - Referral sent to gynecology for consideration for a pessary   3. Vaginal atrophy -could not tolerate the vaginal estrogen cream   Return in about 3 months (around 05/29/2022) for PVR and OAB questionnaire.  [address] [id number], KaKirke Shaggyittlejohn,acting as a scribe for SHNorth Coast Surgery Center LtdPA-C.,have documented all relevant documentation on the behalf of Bobbye Petti, PA-C,as directed by  SHWesley Rehabilitation HospitalPA-C while in the presence of SHBurtonPA-C.  I have reviewed the above documentation for accuracy and completeness, and I agree with the above.    ShZara CouncilPA-C

## 2022-03-05 ENCOUNTER — Ambulatory Visit: Payer: Medicare Other | Admitting: Internal Medicine

## 2022-03-09 ENCOUNTER — Ambulatory Visit
Admission: RE | Admit: 2022-03-09 | Discharge: 2022-03-09 | Disposition: A | Discharge status: Home / Self Care | Payer: Medicare Other | Attending: Internal Medicine | Admitting: Internal Medicine

## 2022-03-09 ENCOUNTER — Ambulatory Visit (INDEPENDENT_AMBULATORY_CARE_PROVIDER_SITE_OTHER): Payer: Medicare Other | Admitting: Internal Medicine

## 2022-03-09 ENCOUNTER — Other Ambulatory Visit: Payer: Self-pay | Admitting: Internal Medicine

## 2022-03-09 ENCOUNTER — Ambulatory Visit (INDEPENDENT_AMBULATORY_CARE_PROVIDER_SITE_OTHER): Payer: Medicare Other

## 2022-03-09 ENCOUNTER — Encounter: Payer: Self-pay | Admitting: Internal Medicine

## 2022-03-09 ENCOUNTER — Ambulatory Visit
Admission: RE | Admit: 2022-03-09 | Discharge: 2022-03-09 | Disposition: A | Discharge status: Home / Self Care | Payer: Medicare Other | Source: Ambulatory Visit | Attending: Internal Medicine | Admitting: Internal Medicine

## 2022-03-09 VITALS — BP 116/62 | HR 74 | Temp 97.1°F | Resp 17 | Ht 63.0 in | Wt 178.0 lb

## 2022-03-09 VITALS — BP 116/62 | HR 74 | Temp 97.1°F | Wt 178.0 lb

## 2022-03-09 DIAGNOSIS — E785 Hyperlipidemia, unspecified: Secondary | ICD-10-CM

## 2022-03-09 DIAGNOSIS — N393 Stress incontinence (female) (male): Secondary | ICD-10-CM

## 2022-03-09 DIAGNOSIS — I700 Atherosclerosis of aorta: Secondary | ICD-10-CM | POA: Diagnosis not present

## 2022-03-09 DIAGNOSIS — Z0000 Encounter for general adult medical examination without abnormal findings: Secondary | ICD-10-CM

## 2022-03-09 DIAGNOSIS — M545 Low back pain, unspecified: Secondary | ICD-10-CM

## 2022-03-09 DIAGNOSIS — F5101 Primary insomnia: Secondary | ICD-10-CM

## 2022-03-09 DIAGNOSIS — Z13820 Encounter for screening for osteoporosis: Secondary | ICD-10-CM | POA: Diagnosis not present

## 2022-03-09 DIAGNOSIS — R102 Pelvic and perineal pain: Secondary | ICD-10-CM

## 2022-03-09 DIAGNOSIS — I10 Essential (primary) hypertension: Secondary | ICD-10-CM | POA: Diagnosis not present

## 2022-03-09 DIAGNOSIS — Z6831 Body mass index (BMI) 31.0-31.9, adult: Secondary | ICD-10-CM

## 2022-03-09 DIAGNOSIS — Z1231 Encounter for screening mammogram for malignant neoplasm of breast: Secondary | ICD-10-CM | POA: Diagnosis not present

## 2022-03-09 DIAGNOSIS — E1142 Type 2 diabetes mellitus with diabetic polyneuropathy: Secondary | ICD-10-CM

## 2022-03-09 DIAGNOSIS — Z85850 Personal history of malignant neoplasm of thyroid: Secondary | ICD-10-CM

## 2022-03-09 DIAGNOSIS — J410 Simple chronic bronchitis: Secondary | ICD-10-CM | POA: Diagnosis not present

## 2022-03-09 DIAGNOSIS — K219 Gastro-esophageal reflux disease without esophagitis: Secondary | ICD-10-CM | POA: Diagnosis not present

## 2022-03-09 DIAGNOSIS — E039 Hypothyroidism, unspecified: Secondary | ICD-10-CM

## 2022-03-09 DIAGNOSIS — E6609 Other obesity due to excess calories: Secondary | ICD-10-CM

## 2022-03-09 DIAGNOSIS — N1831 Chronic kidney disease, stage 3a: Secondary | ICD-10-CM

## 2022-03-09 DIAGNOSIS — G4733 Obstructive sleep apnea (adult) (pediatric): Secondary | ICD-10-CM | POA: Diagnosis not present

## 2022-03-09 DIAGNOSIS — N183 Chronic kidney disease, stage 3 unspecified: Secondary | ICD-10-CM | POA: Insufficient documentation

## 2022-03-09 LAB — POCT URINALYSIS DIPSTICK
Bilirubin, UA: NEGATIVE
Blood, UA: NEGATIVE
Glucose, UA: NEGATIVE
Ketones, UA: NEGATIVE
Leukocytes, UA: NEGATIVE
Nitrite, UA: NEGATIVE
Protein, UA: NEGATIVE
Spec Grav, UA: 1.01 (ref 1.010–1.025)
Urobilinogen, UA: 0.2 E.U./dL
pH, UA: 5 (ref 5.0–8.0)

## 2022-03-09 LAB — POCT GLYCOSYLATED HEMOGLOBIN (HGB A1C): Hemoglobin A1C: 6.4 % — AB (ref 4.0–5.6)

## 2022-03-09 MED ORDER — TRAZODONE HCL 50 MG PO TABS: 25.0000 mg | ORAL_TABLET | Freq: Every evening | ORAL | 0 refills | Status: DC | PRN

## 2022-03-09 NOTE — Assessment & Plan Note (Signed)
POCT A1c 6.4% Urine microalbumin checked within the last year Encouraged her to consume a low-carb diet and exercise weight loss Eye exam UTD Encourage routine foot exams Encouraged her to get a flu shot in the fall Encouraged her to get a COVID-vaccine Pneumonia vaccines UTD

## 2022-03-09 NOTE — Assessment & Plan Note (Signed)
Doing okay off meds We will monitor

## 2022-03-09 NOTE — Assessment & Plan Note (Signed)
Encourage smoking cessation, she declines Continue Montelukast, Spiriva and Albuterol

## 2022-03-09 NOTE — Telephone Encounter (Signed)
Requested Prescriptions  Pending Prescriptions Disp Refills  . atorvastatin (LIPITOR) 10 MG tablet [Pharmacy Med Name: Atorvastatin Calcium 10 MG Oral Tablet] 90 tablet 1    Sig: TAKE 1 TABLET BY MOUTH  DAILY     Cardiovascular:  Antilipid - Statins Failed - 03/09/2022  4:45 AM      Failed - Lipid Panel in normal range within the last 12 months    Cholesterol  Date Value Ref Range Status  09/04/2021 157 <200 mg/dL Final   LDL Cholesterol (Calc)  Date Value Ref Range Status  09/04/2021 85 mg/dL (calc) Final    Comment:    Reference range: <100 . Desirable range <100 mg/dL for primary prevention;   <70 mg/dL for patients with CHD or diabetic patients  with > or = 2 CHD risk factors. Marland Kitchen LDL-C is now calculated using the Martin-Hopkins  calculation, which is a validated novel method providing  better accuracy than the Friedewald equation in the  estimation of LDL-C.  Cresenciano Genre et al. Annamaria Helling. 0932;671(24): 2061-2068  (http://education.[employer].com/faq/FAQ164)    HDL  Date Value Ref Range Status  09/04/2021 53 > OR = 50 mg/dL Final   Triglycerides  Date Value Ref Range Status  09/04/2021 101 <150 mg/dL Final         Passed - Patient is not pregnant      Passed - Valid encounter within last 12 months    Recent Outpatient Visits          Today Type 2 diabetes mellitus with peripheral neuropathy [facility])   [place], NP   1 month ago Abdominal cramping   [facility] [place], Coralie Keens, NP   5 months ago Shortness of breath   [facility] [place], Coralie Keens, NP   6 months ago Encounter for general adult medical examination with abnormal findings   [facility] [place], Coralie Keens, NP   6 months ago Thrombocytopenia [facility])   [facility], Coralie Keens, NP      Future Appointments            In 2 months [place], Gordan Payment [place]   In 6 months [place],  Coralie Keens, NP [facility], [place] [facility]

## 2022-03-09 NOTE — Assessment & Plan Note (Signed)
BMET reviewed Continue Valsartan for renal protection

## 2022-03-09 NOTE — Assessment & Plan Note (Signed)
Lipid profile reviewed Encouraged her to consume a low-fat diet Continue Atorvastatin and Aspirin

## 2022-03-09 NOTE — Assessment & Plan Note (Signed)
Encourage weight loss as this can help reduce sleep apnea symptoms Continue CPAP 

## 2022-03-09 NOTE — Assessment & Plan Note (Signed)
Controlled on Valsartan Reinforced DASH diet and exercise for weight loss Kidney function reviewed

## 2022-03-09 NOTE — Patient Instructions (Signed)
Health Maintenance, Female Adopting a healthy lifestyle and getting preventive care are important in promoting health and wellness. Ask your health care provider about: The right schedule for you to have regular tests and exams. Things you can do on your own to prevent diseases and keep yourself healthy. What should I know about diet, weight, and exercise? Eat a healthy diet  Eat a diet that includes plenty of vegetables, fruits, low-fat dairy products, and lean protein. Do not eat a lot of foods that are high in solid fats, added sugars, or sodium. Maintain a healthy weight Body mass index (BMI) is used to identify weight problems. It estimates body fat based on height and weight. Your health care provider can help determine your BMI and help you achieve or maintain a healthy weight. Get regular exercise Get regular exercise. This is one of the most important things you can do for your health. Most adults should: Exercise for at least 150 minutes each week. The exercise should increase your heart rate and make you sweat (moderate-intensity exercise). Do strengthening exercises at least twice a week. This is in addition to the moderate-intensity exercise. Spend less time sitting. Even light physical activity can be beneficial. Watch cholesterol and blood lipids Have your blood tested for lipids and cholesterol at 81 years of age, then have this test every 5 years. Have your cholesterol levels checked more often if: Your lipid or cholesterol levels are high. You are older than 81 years of age. You are at high risk for heart disease. What should I know about cancer screening? Depending on your health history and family history, you may need to have cancer screening at various ages. This may include screening for: Breast cancer. Cervical cancer. Colorectal cancer. Skin cancer. Lung cancer. What should I know about heart disease, diabetes, and high blood pressure? Blood pressure and heart  disease High blood pressure causes heart disease and increases the risk of stroke. This is more likely to develop in people who have high blood pressure readings or are overweight. Have your blood pressure checked: Every 3-5 years if you are 18-39 years of age. Every year if you are 40 years old or older. Diabetes Have regular diabetes screenings. This checks your fasting blood sugar level. Have the screening done: Once every three years after age 40 if you are at a normal weight and have a low risk for diabetes. More often and at a younger age if you are overweight or have a high risk for diabetes. What should I know about preventing infection? Hepatitis B If you have a higher risk for hepatitis B, you should be screened for this virus. Talk with your health care provider to find out if you are at risk for hepatitis B infection. Hepatitis C Testing is recommended for: Everyone born from 1945 through 1965. Anyone with known risk factors for hepatitis C. Sexually transmitted infections (STIs) Get screened for STIs, including gonorrhea and chlamydia, if: You are sexually active and are younger than 81 years of age. You are older than 81 years of age and your health care provider tells you that you are at risk for this type of infection. Your sexual activity has changed since you were last screened, and you are at increased risk for chlamydia or gonorrhea. Ask your health care provider if you are at risk. Ask your health care provider about whether you are at high risk for HIV. Your health care provider may recommend a prescription medicine to help prevent HIV   infection. If you choose to take medicine to prevent HIV, you should first get tested for HIV. You should then be tested every 3 months for as long as you are taking the medicine. Pregnancy If you are about to stop having your period (premenopausal) and you may become pregnant, seek counseling before you get pregnant. Take 400 to 800  micrograms (mcg) of folic acid every day if you become pregnant. Ask for birth control (contraception) if you want to prevent pregnancy. Osteoporosis and menopause Osteoporosis is a disease in which the bones lose minerals and strength with aging. This can result in bone fractures. If you are 34 years old or older, or if you are at risk for osteoporosis and fractures, ask your health care provider if you should: Be screened for bone loss. Take a calcium or vitamin D supplement to lower your risk of fractures. Be given hormone replacement therapy (HRT) to treat symptoms of menopause. Follow these instructions at home: Alcohol use Do not drink alcohol if: Your health care provider tells you not to drink. You are pregnant, may be pregnant, or are planning to become pregnant. If you drink alcohol: Limit how much you have to: 0-1 drink a day. Know how much alcohol is in your drink. In the [place] one drink equals one 12 oz bottle of beer (355 mL), one 5 oz glass of wine (148 mL), or one 1 oz glass of hard liquor (44 mL). Lifestyle Do not use any products that contain nicotine or tobacco. These products include cigarettes, chewing tobacco, and vaping devices, such as e-cigarettes. If you need help quitting, ask your health care provider. Do not use street drugs. Do not share needles. Ask your health care provider for help if you need support or information about quitting drugs. General instructions Schedule regular health, dental, and eye exams. Stay current with your vaccines. Tell your health care provider if: You often feel depressed. You have ever been abused or do not feel safe at home. Summary Adopting a healthy lifestyle and getting preventive care are important in promoting health and wellness. Follow your health care provider's instructions about healthy diet, exercising, and getting tested or screened for diseases. Follow your health care provider's instructions on monitoring your  cholesterol and blood pressure. This information is not intended to replace advice given to you by your health care provider. Make sure you discuss any questions you have with your health care provider. Document Revised: 01/27/2021 Document Reviewed: 01/27/2021 Elsevier Patient Education  [place].  Steps to Quit Smoking Smoking tobacco is the leading cause of preventable death. It can affect almost every organ in the body. Smoking puts you and those around you at risk for developing many serious chronic diseases. Quitting smoking can be very challenging. Do not get discouraged if you are not successful the first time. Some people need to make many attempts to quit before they achieve long-term success. Do your best to stick to your quit plan, and talk with your health care provider if you have any questions or concerns. How do I get ready to quit? When you decide to quit smoking, create a plan to help you succeed. Before you quit: Pick a date to quit. Set a date within the next 2 weeks to give you time to prepare. Write down the reasons why you are quitting. Keep this list in places where you will see it often. Tell your family, friends, and co-workers that you are quitting. Support from people you are  close to can make quitting easier. Talk with your health care provider about your options for quitting smoking. Find out what treatment options are covered by your health insurance. Identify people, places, things, and activities that make you want to smoke (triggers). Avoid them. What first steps can I take to quit smoking? Throw away all cigarettes at home, at work, and in your car. Throw away smoking accessories, such as Scientist, research (medical). Clean your car. Make sure to empty the ashtray. Clean your home, including curtains and carpets. What strategies can I use to quit smoking? Talk with your health care provider about combining strategies, such as taking medicines while you are  also receiving in-person counseling. Using these two strategies together makes you more likely to succeed in quitting than if you used either strategy on its own. If you are pregnant or breastfeeding, talk with your health care provider about finding counseling or other support strategies to quit smoking. Do not take medicine to help you quit smoking unless your health care provider tells you to. Quit right away Quit smoking completely, instead of gradually reducing how much you smoke over a period of time. Stopping smoking right away may be more successful than gradually quitting. Attend in-person counseling to help you build problem-solving skills. You are more likely to succeed in quitting if you attend counseling sessions regularly. Even short sessions of 10 minutes can be effective. Take medicine You may take medicines to help you quit smoking. Some medicines require a prescription. You can also purchase over-the-counter medicines. Medicines may have nicotine in them to replace the nicotine in cigarettes. Medicines may: Help to stop cravings. Help to relieve withdrawal symptoms. Your health care provider may recommend: Nicotine patches, gum, or lozenges. Nicotine inhalers or sprays. Non-nicotine medicine that you take by mouth. Find resources Find resources and support systems that can help you quit smoking and remain smoke-free after you quit. These resources are most helpful when you use them often. They include: Online chats with a Social worker. Telephone quitlines. Printed Furniture conservator/restorer. Support groups or group counseling. Text messaging programs. Mobile phone apps or applications. Use apps that can help you stick to your quit plan by providing reminders, tips, and encouragement. Examples of free services include Quit Guide from the CDC and smokefree.gov  What can I do to make it easier to quit?  Reach out to your family and friends for support and encouragement. Call telephone  quitlines, such as 1-800-QUIT-NOW, reach out to support groups, or work with a counselor for support. Ask people who smoke to avoid smoking around you. Avoid places that trigger you to smoke, such as bars, parties, or smoke-break areas at work. Spend time with people who do not smoke. Lessen the stress in your life. Stress can be a smoking trigger for some people. To lessen stress, try: Exercising regularly. Doing deep-breathing exercises. Doing yoga. Meditating. What benefits will I see if I quit smoking? Over time, you should start to see positive results, such as: Improved sense of smell and taste. Decreased coughing and sore throat. Slower heart rate. Lower blood pressure. Clearer and healthier skin. The ability to breathe more easily. Fewer sick days. Summary Quitting smoking can be very challenging. Do not get discouraged if you are not successful the first time. Some people need to make many attempts to quit before they achieve long-term success. When you decide to quit smoking, create a plan to help you succeed. Quit smoking right away, not slowly over a period of  time. Find resources and support systems that can help you quit smoking and remain smoke-free after you quit. This information is not intended to replace advice given to you by your health care provider. Make sure you discuss any questions you have with your health care provider. Document Revised: 08/29/2021 Document Reviewed: 08/29/2021 Elsevier Patient Education  [place].  Steps to Quit Smoking Smoking tobacco is the leading cause of preventable death. It can affect almost every organ in the body. Smoking puts you and people around you at risk for many serious, long-lasting (chronic) diseases. Quitting smoking can be hard, but it is one of the best things that you can do for your health. It is never too late to quit. Do not give up if you cannot quit the first time. Some people need to try many times to  quit. Do your best to stick to your quit plan, and talk with your doctor if you have any questions or concerns. How do I get ready to quit? Pick a date to quit. Set a date within the next 2 weeks to give you time to prepare. Write down the reasons why you are quitting. Keep this list in places where you will see it often. Tell your family, friends, and co-workers that you are quitting. Their support is important. Talk with your doctor about the choices that may help you quit. Find out if your health insurance will pay for these treatments. Know the people, places, things, and activities that make you want to smoke (triggers). Avoid them. What first steps can I take to quit smoking? Throw away all cigarettes at home, at work, and in your car. Throw away the things that you use when you smoke, such as ashtrays and lighters. Clean your car. Empty the ashtray. Clean your home, including curtains and carpets. What can I do to help me quit smoking? Talk with your doctor about taking medicines and seeing a counselor. You are more likely to succeed when you do both. If you are pregnant or breastfeeding: Talk with your doctor about counseling or other ways to quit smoking. Do not take medicine to help you quit smoking unless your doctor tells you to. Quit right away Quit smoking completely, instead of slowly cutting back on how much you smoke over a period of time. Stopping smoking right away may be more successful than slowly quitting. Go to counseling. In-person is best if this is an option. You are more likely to quit if you go to counseling sessions regularly. Take medicine You may take medicines to help you quit. Some medicines need a prescription, and some you can buy over-the-counter. Some medicines may contain a drug called nicotine to replace the nicotine in cigarettes. Medicines may: Help you stop having the desire to smoke (cravings). Help to stop the problems that come when you stop  smoking (withdrawal symptoms). Your doctor may ask you to use: Nicotine patches, gum, or lozenges. Nicotine inhalers or sprays. Non-nicotine medicine that you take by mouth. Find resources Find resources and other ways to help you quit smoking and remain smoke-free after you quit. They include: Online chats with a Social worker. Phone quitlines. Printed Furniture conservator/restorer. Support groups or group counseling. Text messaging programs. Mobile phone apps. Use apps on your mobile phone or tablet that can help you stick to your quit plan. Examples of free services include Quit Guide from the CDC and smokefree.gov  What can I do to make it easier to quit?  Talk to  your family and friends. Ask them to support and encourage you. Call a phone quitline, such as 1-800-QUIT-NOW, reach out to support groups, or work with a Social worker. Ask people who smoke to not smoke around you. Avoid places that make you want to smoke, such as: Bars. Parties. Smoke-break areas at work. Spend time with people who do not smoke. Lower the stress in your life. Stress can make you want to smoke. Try these things to lower stress: Getting regular exercise. Doing deep-breathing exercises. Doing yoga. Meditating. What benefits will I see if I quit smoking? Over time, you may have: A better sense of smell and taste. Less coughing and sore throat. A slower heart rate. Lower blood pressure. Clearer skin. Better breathing. Fewer sick days. Summary Quitting smoking can be hard, but it is one of the best things that you can do for your health. Do not give up if you cannot quit the first time. Some people need to try many times to quit. When you decide to quit smoking, make a plan to help you succeed. Quit smoking right away, not slowly over a period of time. When you start quitting, get help and support to keep you smoke-free. This information is not intended to replace advice given to you by your health care provider.  Make sure you discuss any questions you have with your health care provider. Document Revised: 08/29/2021 Document Reviewed: 08/29/2021 Elsevier Patient Education  [place].

## 2022-03-09 NOTE — Assessment & Plan Note (Signed)
TSH reviewed Continue her current dose of Levothyroxine

## 2022-03-09 NOTE — Progress Notes (Signed)
Subjective:    Patient ID: Brooke King, female    DOB: September 22, 1940, 81 y.o.   MRN: [id number]  HPI  Patient presents to clinic today for follow-up of chronic conditions.  HTN: Her BP today is 116/62.  She is taking Valsartan as prescribed.  ECG from 11/2013 reviewed.  HLD: Her last LDL was 85, triglycerides 101, 08/2021.  She denies myalgias on Atorvastatin. She is taking Aspirin as well. She does not consume a low-fat diet.  DM2 with Peripheral Neuropathy: Her last A1c was 6.4%, 08/2021.  She is not taking any oral diabetic medication at this time. She is taking Gabapentin for neuropathic pain. She does not check her sugars.  She checks her feet routinely.  Her last eye exam was 12/2021 .  Flu 05/2021.  Pneumovax 08/2021.  Prevnar 11/2015.  COVID never.  GERD: She is not sure what triggers this.  She denies breakthrough on Omeprazole.  Upper GI from 10/2015 reviewed.  COPD: She denies chronic cough or shortness of breath.  She is taking Montelukast, Spiriva and Albuterol as prescribed.  There are no PFTs on file.  She does smoke.  OSA: She averages 6 to 7 hours of sleep per night with the use of her CPAP.  Sleep study from 04/2015 reviewed.  Hypothyroidism: Status post thyroidectomy secondary to cancer.  She denies any issues on her current dose of Levothyroxine.  She does not follow with endocrinology.  Anxiety: Chronic, she is not currently taking any medications for this.  She is not currently seeing a therapist.  She denies depression, SI/HI.  Insomnia: She has difficulty staying asleep. She is using Trazadone as prescribed. Sleep study from 04/2015 reviewed.   OAB: She is taking Myrbetriq as prescribed. She reports she goes next week to be fitted for a pessary. She follow with urology.  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 She is on Olmesartan for renal protection. She does not follow with nephrology.  She has also been having lower pelvic pain and low back pain. This  started a few weeks ago. The pain in intermittent. She describes the pelvic pain as pressure. She reports urinary urgency, frequency, burning with urination and vaginal itching. The pain in her back does not radiate, but it is worse with movement. She has tried heat and Tylenol OTC with minimal relief of symptoms.  Review of Systems     Past Medical History:  Diagnosis Date   Allergy    B12 deficiency    Chest pain syndrome    COPD (chronic obstructive pulmonary disease) (HCC)    Depression    GERD (gastroesophageal reflux disease)    Glaucoma    History of colonic polyps    History of degenerative disc disease    c-spine, status post cervical laminectomy   Hyperlipidemia    Hypertension    Hypothyroidism    Insomnia    Migraine    Restless leg syndrome    Seasonal allergies    Sleep apnea    Thyroid cancer (HCC)    Urinary incontinence     Current Outpatient Medications  Medication Sig Dispense Refill   acetaminophen (TYLENOL) 500 MG tablet Take 500 mg by mouth every 6 (six) hours as needed.     albuterol (VENTOLIN HFA) 108 (90 Base) MCG/ACT inhaler USE 1 TO 2 INHALATIONS BY MOUTH  INTO THE LUNGS EVERY 6 HOURS AS  NEEDED FOR WHEEZING OR SHORTNESS OF BREATH 34 g 0   atorvastatin (LIPITOR)  10 MG tablet Take 1 tablet (10 mg total) by mouth daily. 90 tablet 3   Calcium Carb-Cholecalciferol (CALCIUM + D3) 600-200 MG-UNIT TABS Take by mouth.     conjugated estrogens (PREMARIN) vaginal cream Apply 0.'5mg'$  (pea-sized amount)  just inside the vaginal introitus with a finger-tip on  Monday, Wednesday and Friday nights. (Patient not taking: Reported on 12/15/2021) 30 g 12   Cranberry 180 MG CAPS Take by mouth.     fexofenadine (ALLEGRA) 180 MG tablet Take 180 mg by mouth daily.     gabapentin (NEURONTIN) 300 MG capsule TAKE 1 CAPSULE BY MOUTH 3  TIMES DAILY 270 capsule 3   hydrOXYzine (ATARAX/VISTARIL) 10 MG tablet Take 1 tablet (10 mg total) by mouth daily as needed. 30 tablet 0    ipratropium (ATROVENT) 0.03 % nasal spray Place into both nostrils.     latanoprost (XALATAN) 0.005 % ophthalmic solution Place 1 drop into both eyes at bedtime.     mirabegron ER (MYRBETRIQ) 50 MG TB24 tablet Take 1 tablet (50 mg total) by mouth daily. 30 tablet 2   montelukast (SINGULAIR) 10 MG tablet Take 1 tablet (10 mg total) by mouth at bedtime. 90 tablet 1   omeprazole (PRILOSEC) 20 MG capsule Take 1 capsule (20 mg total) by mouth daily. 90 capsule 1   SYNTHROID 112 MCG tablet TAKE 1 TABLET BY MOUTH  DAILY BEFORE BREAKFAST 90 tablet 2   tiotropium (SPIRIVA) 18 MCG inhalation capsule Place 18 mcg into inhaler and inhale daily. (Patient not taking: Reported on 12/15/2021)     traZODone (DESYREL) 50 MG tablet Take 0.5 tablets (25 mg total) by mouth at bedtime as needed for sleep. 45 tablet 0   triamcinolone cream (KENALOG) 0.1 % Apply 1 application. topically 2 (two) times daily. 30 g 0   valsartan (DIOVAN) 80 MG tablet Take 1 tablet (80 mg total) by mouth daily. 90 tablet 1   vitamin B-12 (CYANOCOBALAMIN) 1000 MCG tablet Take 1,000 mcg by mouth daily.     No current facility-administered medications for this visit.    Allergies  Allergen Reactions   Toviaz [Fesoterodine Fumarate Er] Other (See Comments)    Throat closed   Cortisone    Doxycycline Hyclate    Hydrocodone    Keflex [Cephalexin]    Septra [Sulfamethoxazole-Trimethoprim]    Tagamet [Cimetidine]    Zoloft [Sertraline]    Penicillins Rash    Family History  Problem Relation Age of Onset   Heart disease Mother    Breast cancer Neg Hx     Social History   Socioeconomic History   Marital status: Widowed    Spouse name: Not on file   Number of children: Not on file   Years of education: Not on file   Highest education level: Not on file  Occupational History   Occupation: Retired  Tobacco Use   Smoking status: Every Day    Packs/day: 1.00    Years: 40.00    Total pack years: 40.00    Types: Cigarettes    Smokeless tobacco: Never  Vaping Use   Vaping Use: Never used  Substance and Sexual Activity   Alcohol use: No   Drug use: No   Sexual activity: Not on file  Other Topics Concern   Not on file  Social History Narrative   Not on file   Social Determinants of Health   Financial Resource Strain: Not on file  Food Insecurity: Not on file  Transportation Needs: Not on file  Physical Activity: Not on file  Stress: Not on file  Social Connections: Not on file  Intimate Partner Violence: Not on file     Constitutional: Denies fever, malaise, fatigue, headache or abrupt weight changes.  HEENT: Denies eye pain, eye redness, ear pain, ringing in the ears, wax buildup, runny nose, nasal congestion, bloody nose, or sore throat. Respiratory: Denies difficulty breathing, shortness of breath, cough or sputum production.   Cardiovascular: Denies chest pain, chest tightness, palpitations or swelling in the hands or feet.  Gastrointestinal: Pt reports pelvic pressure. Denies bloating, constipation, diarrhea or blood in the stool.  GU: Pt reports urinary urgency, frequency, burning with urination and vaginal itching. Denies pain with urination, blood in urine, odor or discharge. Musculoskeletal: Pt reports low back pain. Denies decrease in range of motion, difficulty with gait, or joint swelling.  Skin: Denies redness, rashes, lesions or ulcercations.  Neurological: Pt reports peripheral neuropathy, insomnia. Denies dizziness, difficulty with memory, difficulty with speech or problems with balance and coordination.  Psych: Pt has a history of anxiety. Denies depression, SI/HI.  No other specific complaints in a complete review of systems (except as listed in HPI above).  Objective:   Physical Exam   BP 116/62 (BP Location: Left Arm, Patient Position: Sitting, Cuff Size: Normal)   Pulse 74   Temp (!) 97.1 F (36.2 C) (Temporal)   Wt 178 lb (80.7 kg)   SpO2 99%   BMI 31.53 kg/m   Wt  Readings from Last 3 Encounters:  02/26/22 179 lb (81.2 kg)  02/06/22 179 lb (81.2 kg)  12/30/21 180 lb (81.6 kg)    General: Appears her stated age, obese, in NAD. Skin: Warm, dry and intact. No ulcerations noted. HEENT: Head: normal shape and size; Eyes: sclera Skolnick, no icterus, conjunctiva pink, PERRLA and EOMs intact;  Neck:  Neck supple, trachea midline. No masses, lumps or thyromegaly present.  Cardiovascular: Normal rate and rhythm. S1,S2 noted.  No murmur, rubs or gallops noted. No JVD or BLE edema. No carotid bruits noted. Pulmonary/Chest: Normal effort and diminished breath sounds. No respiratory distress. No wheezes, rales or ronchi noted.  Abdomen: Soft and nontender. Normal bowel sounds. No CVA tenderness noted. Musculoskeletal: Normal flexion, extension and rotation of the spine. No bony tenderness noted over the spine or bilateral SI joints. Strength 5/5 BLE. No difficulty with gait.  Neurological: Alert and oriented. Coordination normal.  Psychiatric: Mood and affect normal. Mildly anxious appearing. Judgment and thought content normal.    BMET    Component Value Date/Time   NA 142 02/10/2022 1333   K 3.9 02/10/2022 1333   K 3.8 12/15/2013 1202   CL 106 02/10/2022 1333   CO2 27 02/10/2022 1333   GLUCOSE 92 02/10/2022 1333   BUN 13 02/10/2022 1333   CREATININE 0.97 (H) 02/10/2022 1333   CALCIUM 9.3 02/10/2022 1333   GFRNONAA >60 01/19/2012 0949   GFRAA >60 01/19/2012 0949    Lipid Panel     Component Value Date/Time   CHOL 157 09/04/2021 1021   TRIG 101 09/04/2021 1021   HDL 53 09/04/2021 1021   CHOLHDL 3.0 09/04/2021 1021   LDLCALC 85 09/04/2021 1021    CBC    Component Value Date/Time   WBC 7.0 02/10/2022 1333   RBC 4.41 02/10/2022 1333   HGB 13.8 02/10/2022 1333   HCT 40.7 02/10/2022 1333   PLT 140 02/10/2022 1333   MCV 92.3 02/10/2022 1333   MCH 31.3 02/10/2022 1333   MCHC  33.9 02/10/2022 1333   RDW 12.4 02/10/2022 1333   LYMPHSABS 2,310  02/10/2022 1333   EOSABS 392 02/10/2022 1333   BASOSABS 91 02/10/2022 1333    Hgb A1C Lab Results  Component Value Date   HGBA1C 6.4 (H) 09/04/2021           Assessment & Plan:   Urinary Urgency, Frequency, Burning with Urination, Vaginal Itching, Pelvic Pressure, Low Back Pain:  Urinalysis: normal Push fluids Xray lumbar spine today Continue heat and Tylenol for now She will follow up with GYN and urology next week as planned  RTC in 6 months, follow up chronic conditions  Webb Silversmith, NP

## 2022-03-09 NOTE — Assessment & Plan Note (Signed)
Encourage diet and exercise for weight loss 

## 2022-03-09 NOTE — Assessment & Plan Note (Signed)
Continue Myrbetriq She will be fitted for pessary next week She would continue to follow with GYN and urology

## 2022-03-09 NOTE — Patient Instructions (Signed)

## 2022-03-09 NOTE — Progress Notes (Signed)
Subjective:   Brooke King is a 81 y.o. female who presents for Medicare Annual (Subsequent) preventive examination.  Review of Systems    Per HPI unless specifically indicated above         Objective:    Today's Vitals   03/09/22 1008  BP: 116/62  Pulse: 74  Resp: 17  Temp: (!) 97.1 F (36.2 C)  TempSrc: Oral  SpO2: 99%  Weight: 178 lb (80.7 kg)  Height: '5\' 3"'$  (1.6 m)  PainSc: 8   PainLoc: Back   Body mass index is 31.53 kg/m.     11/18/2015   10:08 AM  Advanced Directives  Does Patient Have a Medical Advance Directive? Yes    Current Medications (verified) Outpatient Encounter Medications as of 03/09/2022  Medication Sig   acetaminophen (TYLENOL) 500 MG tablet Take 500 mg by mouth every 6 (six) hours as needed.   albuterol (VENTOLIN HFA) 108 (90 Base) MCG/ACT inhaler USE 1 TO 2 INHALATIONS BY MOUTH  INTO THE LUNGS EVERY 6 HOURS AS  NEEDED FOR WHEEZING OR SHORTNESS OF BREATH   atorvastatin (LIPITOR) 10 MG tablet Take 1 tablet (10 mg total) by mouth daily.   Calcium Carb-Cholecalciferol (CALCIUM + D3) 600-200 MG-UNIT TABS Take by mouth.   Cranberry 180 MG CAPS Take by mouth.   fexofenadine (ALLEGRA) 180 MG tablet Take 180 mg by mouth daily.   gabapentin (NEURONTIN) 300 MG capsule TAKE 1 CAPSULE BY MOUTH 3  TIMES DAILY   hydrOXYzine (ATARAX/VISTARIL) 10 MG tablet Take 1 tablet (10 mg total) by mouth daily as needed.   ipratropium (ATROVENT) 0.03 % nasal spray Place into both nostrils.   latanoprost (XALATAN) 0.005 % ophthalmic solution Place 1 drop into both eyes at bedtime.   mirabegron ER (MYRBETRIQ) 50 MG TB24 tablet Take 1 tablet (50 mg total) by mouth daily.   montelukast (SINGULAIR) 10 MG tablet Take 1 tablet (10 mg total) by mouth at bedtime.   omeprazole (PRILOSEC) 20 MG capsule Take 1 capsule (20 mg total) by mouth daily.   SYNTHROID 112 MCG tablet TAKE 1 TABLET BY MOUTH  DAILY BEFORE BREAKFAST   tiotropium (SPIRIVA) 18 MCG inhalation capsule Place 18 mcg  into inhaler and inhale daily.   triamcinolone cream (KENALOG) 0.1 % Apply 1 application. topically 2 (two) times daily.   valsartan (DIOVAN) 80 MG tablet Take 1 tablet (80 mg total) by mouth daily.   vitamin B-12 (CYANOCOBALAMIN) 1000 MCG tablet Take 1,000 mcg by mouth daily.   [DISCONTINUED] conjugated estrogens (PREMARIN) vaginal cream Apply 0.'5mg'$  (pea-sized amount)  just inside the vaginal introitus with a finger-tip on  Monday, Wednesday and Friday nights.   [DISCONTINUED] traZODone (DESYREL) 50 MG tablet Take 0.5 tablets (25 mg total) by mouth at bedtime as needed for sleep.   No facility-administered encounter medications on file as of 03/09/2022.    Allergies (verified) Toviaz [fesoterodine fumarate er], Cortisone, Doxycycline hyclate, Hydrocodone, Keflex [cephalexin], Septra [sulfamethoxazole-trimethoprim], Tagamet [cimetidine], Zoloft [sertraline], and Penicillins   History: Past Medical History:  Diagnosis Date   Allergy    B12 deficiency    Chest pain syndrome    COPD (chronic obstructive pulmonary disease) (HCC)    Depression    GERD (gastroesophageal reflux disease)    Glaucoma    History of colonic polyps    History of degenerative disc disease    c-spine, status post cervical laminectomy   Hyperlipidemia    Hypertension    Hypothyroidism    Insomnia  Migraine    Restless leg syndrome    Seasonal allergies    Sleep apnea    Thyroid cancer [facility])    Urinary incontinence    Past Surgical History:  Procedure Laterality Date   ABDOMINAL HYSTERECTOMY     CATARACT EXTRACTION, BILATERAL     CERVICAL LAMINECTOMY  08/2004   CHOLECYSTECTOMY     COLONOSCOPY WITH PROPOFOL N/A 11/18/2015   Procedure: COLONOSCOPY WITH PROPOFOL;  Surgeon: Hulen Luster, MD;  Location: [facility] ENDOSCOPY;  Service: Gastroenterology;  Laterality: N/A;   ESOPHAGOGASTRODUODENOSCOPY (EGD) WITH PROPOFOL N/A 11/18/2015   Procedure: ESOPHAGOGASTRODUODENOSCOPY (EGD) WITH PROPOFOL;  Surgeon: Hulen Luster, MD;   Location: [facility] ENDOSCOPY;  Service: Gastroenterology;  Laterality: N/A;   HEMORRHOID SURGERY     TUBAL LIGATION     Family History  Problem Relation Age of Onset   Heart disease Mother    Breast cancer Neg Hx    Social History   Socioeconomic History   Marital status: Widowed    Spouse name: Not on file   Number of children: Not on file   Years of education: Not on file   Highest education level: Not on file  Occupational History   Occupation: Retired  Tobacco Use   Smoking status: Every Day    Packs/day: 1.00    Years: 40.00    Total pack years: 40.00    Types: Cigarettes   Smokeless tobacco: Never  Vaping Use   Vaping Use: Never used  Substance and Sexual Activity   Alcohol use: No   Drug use: No   Sexual activity: Not on file  Other Topics Concern   Not on file  Social History Narrative   Not on file   Social Determinants of Health   Financial Resource Strain: Low Risk  (03/09/2022)   Overall Financial Resource Strain (CARDIA)    Difficulty of Paying Living Expenses: Not hard at all  Food Insecurity: No Food Insecurity (03/09/2022)   Hunger Vital Sign    Worried About Running Out of Food in the Last Year: Never true    [place] in the Last Year: Never true  Transportation Needs: No Transportation Needs (03/09/2022)   PRAPARE - Hydrologist (Medical): No    Lack of Transportation (Non-Medical): No  Physical Activity: Sufficiently Active (03/09/2022)   Exercise Vital Sign    Days of Exercise per Week: 7 days    Minutes of Exercise per Session: 30 min  Stress: Stress Concern Present (03/09/2022)   [place]    Feeling of Stress : To some extent  Social Connections: Not on file    Tobacco Counseling Ready to quit: Not Answered Counseling given: Not Answered   Clinical Intake:  Pre-visit preparation completed: No  Pain : 0-10 Pain Score: 8  Pain Type:  Chronic pain Pain Location: Back Pain Orientation: Lower Pain Descriptors / Indicators: Aching Pain Onset: More than a month ago Pain Frequency: Occasional Pain Relieving Factors: Heat and tylenol helps the discomfort Effect of Pain on Daily Activities: the pain worsen with walking  Pain Relieving Factors: Heat and tylenol helps the discomfort  Nutritional Status: BMI > 30  Obese Diabetes: No     Diabetic?No  Interpreter Needed?: No  Information entered by :: Donnie Mesa, CMA   Activities of Daily Living    03/09/2022   10:05 AM 02/06/2022    1:27 PM  In your present state  of health, do you have any difficulty performing the following activities:  Hearing? 0 0  Vision? 1 0  Difficulty concentrating or making decisions? 0 0  Walking or climbing stairs? 0 0  Dressing or bathing? 0 0  Doing errands, shopping? 0 0    Patient Care Team: Jearld Fenton, NP as PCP - General (Internal Medicine) Curley Spice, Virl Diamond, RPH-CPP (Pharmacist)  Indicate any recent Medical Services you may have received from other than [facility] providers in the past year (date may be approximate). No hospitalization in the past 12 months.     Assessment:   This is a routine wellness examination for Denelda.  Hearing/Vision screen No results found.  Dietary issues and exercise activities discussed: Current Exercise Habits: Home exercise routine, Type of exercise: strength training/weights;walking, Time (Minutes): 30, Frequency (Times/Week): 7, Weekly Exercise (Minutes/Week): 210, Intensity: Mild   Goals Addressed             This Visit's Progress    Eat Healthy         Depression Screen    03/09/2022   10:06 AM 02/06/2022    1:26 PM 10/07/2021    8:43 AM 06/10/2021    2:48 PM 02/25/2021    8:27 AM 11/25/2020   10:25 AM  PHQ 2/9 Scores  PHQ - 2 Score 1 0 0 2 1 0  PHQ- 9 Score '7 1 1 7 7     '$ Fall Risk    03/09/2022   10:05 AM 02/06/2022    1:26 PM 10/07/2021    8:43 AM 02/25/2021    8:27  AM 08/22/2020   10:26 AM  [place] in the past year? 0 0 0 0 0  Number falls in past yr: 0 0 0 0 0  Injury with Fall? 0 0 0 0   Risk for fall due to : No Fall Risks No Fall Risks No Fall Risks No Fall Risks No Fall Risks  Follow up Falls evaluation completed Falls evaluation completed Falls evaluation completed Falls evaluation completed Falls evaluation completed    FALL RISK PREVENTION PERTAINING TO THE HOME:  Any stairs in or around the home? No  If so, are there any without handrails? No  Home free of loose throw rugs in walkways, pet beds, electrical cords, etc? Yes  Adequate lighting in your home to reduce risk of falls? Yes   ASSISTIVE DEVICES UTILIZED TO PREVENT FALLS:  Life alert? No  Use of a cane, walker or w/c? No  Grab bars in the bathroom? Yes  Shower chair or bench in shower? No  Elevated toilet seat or a handicapped toilet? No   TIMED UP AND GO:  Was the test performed? Yes .  Length of time to ambulate 10 feet: 6  sec.   Gait slow and steady without use of assistive device  Cognitive Function:        03/09/2022   10:07 AM  6CIT Screen  What Year? 0 points  What month? 0 points  What time? 0 points  Count back from 20 0 points  Months in reverse 0 points  Repeat phrase 4 points  Total Score 4 points    Immunizations Immunization History  Administered Date(s) Administered   Fluad Quad(high Dose 65+) 06/10/2021   Influenza Inj Mdck Quad Pf 06/20/2018, 06/22/2019   Influenza-Unspecified 07/03/2015, 06/11/2016   Pneumococcal Conjugate-13 12/10/2015   Pneumococcal Polysaccharide-23 06/01/2014, 09/04/2021    TDAP status: Up to date  Flu Vaccine  status: Up to date  Pneumococcal vaccine status: Up to date  Covid-19 vaccine status: Completed vaccines  Qualifies for Shingles Vaccine? Yes  Zostavax completed No   Shingrix Completed?: No.    Education has been provided regarding the importance of this vaccine. Patient has been advised to  call insurance company to determine out of pocket expense if they have not yet received this vaccine. Advised may also receive vaccine at local pharmacy or Health Dept. Verbalized acceptance and understanding.  Screening Tests Health Maintenance  Topic Date Due   TETANUS/TDAP  Never done   Zoster Vaccines- Shingrix (1 of 2) Never done   DEXA SCAN  Never done   FOOT EXAM  02/25/2022   COVID-19 Vaccine (1) 05/22/2022 (Originally 09/26/1941)   INFLUENZA VACCINE  04/21/2022   HEMOGLOBIN A1C  09/08/2022   OPHTHALMOLOGY EXAM  01/17/2023   Pneumonia Vaccine 79+ Years old  Completed   HPV VACCINES  Aged Out    Health Maintenance  Health Maintenance Due  Topic Date Due   TETANUS/TDAP  Never done   Zoster Vaccines- Shingrix (1 of 2) Never done   DEXA SCAN  Never done   FOOT EXAM  02/25/2022    Colorectal cancer screening: No longer required.   Mammogram status: No longer required due to age.  Bone Density status: Ordered 03/09/2022. Pt provided with contact info and advised to call to schedule appt.  Lung Cancer Screening: (Low Dose CT Chest recommended if Age 79-80 years, 30 pack-year currently smoking OR have quit w/in 15years.) does not qualify.   Lung Cancer Screening Referral: no   Additional Screening:  Hepatitis C Screening: does not qualify  Vision Screening: Recommended annual ophthalmology exams for early detection of glaucoma and other disorders of the eye. Is the patient up to date with their annual eye exam?  Yes  Who is the provider or what is the name of the office in which the patient attends annual eye exams? [facility]  If pt is not established with a provider, would they like to be referred to a provider to establish care? No .   Dental Screening: Recommended annual dental exams for proper oral hygiene  Community Resource Referral / Chronic Care Management: CRR required this visit?  No   CCM required this visit?  No      Plan:     I have  personally reviewed and noted the following in the patient's chart:   Medical and social history Use of alcohol, tobacco or illicit drugs  Current medications and supplements including opioid prescriptions.  Functional ability and status Nutritional status Physical activity Advanced directives List of other physicians Hospitalizations, surgeries, and ER visits in previous 12 months Vitals Screenings to include cognitive, depression, and falls Referrals and appointments  In addition, I have reviewed and discussed with patient certain preventive protocols, quality metrics, and best practice recommendations. A written personalized care plan for preventive services as well as general preventive health recommendations were provided to patient.     Wilson Singer, [place]   03/09/2022

## 2022-03-09 NOTE — Assessment & Plan Note (Signed)
Trazodone refilled today.  

## 2022-03-09 NOTE — Assessment & Plan Note (Signed)
Encourage weight loss as this can help reduce sleep apnea symptoms Continue Omeprazole

## 2022-03-09 NOTE — Assessment & Plan Note (Signed)
Lipid profile reviewed Encouraged her to consume low-fat diet Continue Atorvastatin

## 2022-03-09 NOTE — Assessment & Plan Note (Signed)
TSH reviewed Continue current dose of Levothyroxine

## 2022-03-10 ENCOUNTER — Other Ambulatory Visit: Payer: Self-pay | Admitting: Internal Medicine

## 2022-03-10 DIAGNOSIS — J449 Chronic obstructive pulmonary disease, unspecified: Secondary | ICD-10-CM

## 2022-03-11 NOTE — Telephone Encounter (Signed)
Requested Prescriptions  Pending Prescriptions Disp Refills  . albuterol (VENTOLIN HFA) 108 (90 Base) MCG/ACT inhaler [Pharmacy Med Name: ALBUTEROL HFA 90MCG/ACT (PA)] 34 g 3    Sig: USE 1 TO 2 INHALATIONS BY MOUTH  INTO THE LUNGS EVERY 6 HOURS AS  NEEDED FOR WHEEZING OR SHORTNESS OF BREATH     Pulmonology:  Beta Agonists 2 Passed - 03/10/2022 11:44 PM      Passed - Last BP in normal range    BP Readings from Last 1 Encounters:  03/09/22 116/62         Passed - Last Heart Rate in normal range    Pulse Readings from Last 1 Encounters:  03/09/22 74         Passed - Valid encounter within last 12 months    Recent Outpatient Visits          2 days ago Type 2 diabetes mellitus with peripheral neuropathy [facility])   [facility] [place], Coralie Keens, NP   1 month ago Abdominal cramping   [facility] [place], Coralie Keens, NP   5 months ago Shortness of breath   [facility] [place], Coralie Keens, NP   6 months ago Encounter for general adult medical examination with abnormal findings   [facility] [place], Coralie Keens, NP   6 months ago Thrombocytopenia [facility])   [facility], Coralie Keens, NP      Future Appointments            In 2 months [place], Gordan Payment [place]   In 6 months [place], Coralie Keens, NP [facility], [facility]

## 2022-04-01 ENCOUNTER — Ambulatory Visit (INDEPENDENT_AMBULATORY_CARE_PROVIDER_SITE_OTHER): Payer: Medicare Other | Admitting: Family Medicine

## 2022-04-01 ENCOUNTER — Encounter: Payer: Self-pay | Admitting: Family Medicine

## 2022-04-01 ENCOUNTER — Ambulatory Visit: Payer: Self-pay | Admitting: *Deleted

## 2022-04-01 VITALS — BP 125/60 | HR 78 | Temp 97.7°F | Wt 179.0 lb

## 2022-04-01 DIAGNOSIS — J440 Chronic obstructive pulmonary disease with acute lower respiratory infection: Secondary | ICD-10-CM

## 2022-04-01 DIAGNOSIS — J209 Acute bronchitis, unspecified: Secondary | ICD-10-CM | POA: Diagnosis not present

## 2022-04-01 MED ORDER — PREDNISONE 20 MG PO TABS: ORAL_TABLET | ORAL | 0 refills | Status: DC

## 2022-04-01 MED ORDER — AZITHROMYCIN 250 MG PO TABS: ORAL_TABLET | ORAL | 0 refills | Status: DC

## 2022-04-01 NOTE — Telephone Encounter (Signed)
  Chief Complaint: Cough Symptoms: Productive cough, yellowish, worse in mornings. Sinus headache,drainage. Subjective fever "Hot then cold" Pain left side "Maybe from coughing" Frequency: 2-3 days Pertinent Negatives: Patient denies SOB Disposition: '[]'$ ED /'[]'$ Urgent Care (no appt availability in office) / '[x]'$ Appointment(In office/virtual)/ '[]'$  [place] [facility]/ '[]'$ Home Care/ '[]'$ Refused Recommended Disposition /'[]'$ [place] [facility]/ '[]'$  Follow-up with PCP Additional Notes: Appt secured for this AM Reason for Disposition  [1] Known COPD or other severe lung disease (i.e., bronchiectasis, cystic fibrosis, lung surgery) AND [2] worsening symptoms (i.e., increased sputum purulence or amount, increased breathing difficulty  Answer Assessment - Initial Assessment Questions 1. ONSET: "When did the cough begin?"      3 days 2. SEVERITY: "How bad is the cough today?"      Bad in mornings 3. SPUTUM: "Describe the color of your sputum" (none, dry cough; clear, [place], yellow, green)     yellowish 4. HEMOPTYSIS: "Are you coughing up any blood?" If so ask: "How much?" (flecks, streaks, tablespoons, etc.)     no 5. DIFFICULTY BREATHING: "Are you having difficulty breathing?" If Yes, ask: "How bad is it?" (e.g., mild, moderate, severe)    - MILD: No SOB at rest, mild SOB with walking, speaks normally in sentences, can lie down, no retractions, pulse < 100.    - MODERATE: SOB at rest, SOB with minimal exertion and prefers to sit, cannot lie down flat, speaks in phrases, mild retractions, audible wheezing, pulse 100-120.    - SEVERE: Very SOB at rest, speaks in single words, struggling to breathe, sitting hunched forward, retractions, pulse > 120      no 6. FEVER: "Do you have a fever?" If Yes, ask: "What is your temperature, how was it measured, and when did it start?"     No. But "Going from hot to cold." 7. CARDIAC HISTORY: "Do you have any history of heart disease?" (e.g., heart attack, congestive  heart failure)      *No Answer* 8. LUNG HISTORY: "Do you have any history of lung disease?"  (e.g., pulmonary embolus, asthma, emphysema)     *No Answer* 9. PE RISK FACTORS: "Do you have a history of blood clots?" (or: recent major surgery, recent prolonged travel, bedridden)     *No Answer* 10. OTHER SYMPTOMS: "Do you have any other symptoms?" (e.g., runny nose, wheezing, chest pain)       Frontal headache, left side  Protocols used: Cough - Acute Productive-A-AH

## 2022-04-01 NOTE — Telephone Encounter (Signed)
Apt was this morning.

## 2022-04-01 NOTE — Patient Instructions (Addendum)
Thank you for coming to the office today.  Start the Azithromycin Zpak 5 day course  Start the Prednisone 7 day taper  If not improved can do chest X-ray  Please schedule a Follow-up Appointment to: Return if symptoms worsen or fail to improve.  If you have any other questions or concerns, please feel free to call the office or send a message through [place]. You may also schedule an earlier appointment if necessary.  Additionally, you may be receiving a survey about your experience at our office within a few days to 1 week by e-mail or mail. We value your feedback.  Nobie Putnam, DO [place]

## 2022-04-01 NOTE — Progress Notes (Signed)
Subjective:    Patient ID: Brooke King, female    DOB: 1941-01-09, 81 y.o.   MRN: [id number]  Brooke King is a 81 y.o. female presenting on 04/01/2022 for URI  PCP Webb Silversmith, FNP Patient presents for a same day appointment.  HPI  Acute COPD Flare / Bronchitis / Sinusitis Onset 1 week with worsening with thickening sputum production Admits some wheezing and short winded worse Last seen for similar COPD flare 09/2021 treated with Azithromycin. Improved on Prednisone in past Allergies to PCN, Sulfa, Keflex, Doxycycline     03/09/2022   10:06 AM 02/06/2022    1:26 PM 10/07/2021    8:43 AM  Depression screen PHQ 2/9  Decreased Interest 0 0 0  Down, Depressed, Hopeless 1 0 0  PHQ - 2 Score 1 0 0  Altered sleeping 2 0 0  Tired, decreased energy 3 0 1  Change in appetite 1 1 0  Feeling bad or failure about yourself  0 0 0  Trouble concentrating 0 0 0  Moving slowly or fidgety/restless 0 0 0  Suicidal thoughts 0 0 0  PHQ-9 Score '7 1 1  '$ Difficult doing work/chores Somewhat difficult Not difficult at all Not difficult at all    Social History   Tobacco Use   Smoking status: Every Day    Packs/day: 1.00    Years: 40.00    Total pack years: 40.00    Types: Cigarettes   Smokeless tobacco: Never  Vaping Use   Vaping Use: Never used  Substance Use Topics   Alcohol use: No   Drug use: No    Review of Systems Per HPI unless specifically indicated above     Objective:    BP 125/60 (BP Location: Right Arm, Patient Position: Sitting, Cuff Size: Normal)   Pulse 78   Temp 97.7 F (36.5 C) (Temporal)   Wt 179 lb (81.2 kg)   SpO2 99%   BMI 31.71 kg/m   Wt Readings from Last 3 Encounters:  04/01/22 179 lb (81.2 kg)  03/09/22 178 lb (80.7 kg)  03/09/22 178 lb (80.7 kg)    Physical Exam Vitals and nursing note reviewed.  Constitutional:      General: She is not in acute distress.    Appearance: She is well-developed. She is not diaphoretic.     Comments:  Well-appearing, comfortable, cooperative  HENT:     Head: Normocephalic and atraumatic.  Eyes:     General:        Right eye: No discharge.        Left eye: No discharge.     Conjunctiva/sclera: Conjunctivae normal.  Neck:     Thyroid: No thyromegaly.  Cardiovascular:     Rate and Rhythm: Normal rate and regular rhythm.     Heart sounds: Normal heart sounds. No murmur heard. Pulmonary:     Effort: Pulmonary effort is normal. No respiratory distress.     Breath sounds: Wheezing present. No rales.     Comments: cough Musculoskeletal:        General: Normal range of motion.     Cervical back: Normal range of motion and neck supple.  Lymphadenopathy:     Cervical: No cervical adenopathy.  Skin:    General: Skin is warm and dry.     Findings: No erythema or rash.  Neurological:     Mental Status: She is alert and oriented to person, place, and time.  Psychiatric:  Behavior: Behavior normal.     Comments: Well groomed, good eye contact, normal speech and thoughts    Results for orders placed or performed in visit on 03/09/22  POCT urinalysis dipstick  Result Value Ref Range   Color, UA     Clarity, UA     Glucose, UA Negative Negative   Bilirubin, UA Negative    Ketones, UA Negative    Spec Grav, UA 1.010 1.010 - 1.025   Blood, UA Negative    pH, UA 5.0 5.0 - 8.0   Protein, UA Negative Negative   Urobilinogen, UA 0.2 0.2 or 1.0 E.U./dL   Nitrite, UA Negative    Leukocytes, UA Negative Negative   Appearance     Odor    POCT glycosylated hemoglobin (Hb A1C)  Result Value Ref Range   Hemoglobin A1C 6.4 (A) 4.0 - 5.6 %      Assessment & Plan:   Problem List Items Addressed This Visit   None Visit Diagnoses     Acute bronchitis with COPD ([place])    -  Primary   Relevant Medications   azithromycin (ZITHROMAX Z-PAK) 250 MG tablet   predniSONE (DELTASONE) 20 MG tablet       Consistent with mild acute exacerbation of COPD with worsening productive cough.  Similar to prior exacerbations. - No hypoxia (99% on RA), afebrile, no recent hospitalization  Plan: 1. Prednisone taper 7 day 2. Start Azithromycin Z pak (antibiotic) 2 tabs day 1, then 1 tab x 4 days, complete entire course even if improved 2. Use albuterol q 4 hr regularly x 2-3 days. Continue maintenance inhalers Spiriva  She may benefit from [facility] or Trelegy in future for reduce exacerbations COPD if indicated  She also would like to return to see PCP to discuss all of her medications and figure out if any med side effects when she is feeling better. She prefers phone call, not mychart. She asked about her last X-ray results of her back, I have provided those results to her.   Meds ordered this encounter  Medications   azithromycin (ZITHROMAX Z-PAK) 250 MG tablet    Sig: Take 2 tabs ('500mg'$  total) on Day 1. Take 1 tab ('250mg'$ ) daily for next 4 days.    Dispense:  6 tablet    Refill:  0   predniSONE (DELTASONE) 20 MG tablet    Sig: Take daily with food. Start with '60mg'$  (3 pills) x 2 days, then reduce to '40mg'$  (2 pills) x 2 days, then '20mg'$  (1 pill) x 3 days    Dispense:  13 tablet    Refill:  0      Follow up plan: Return if symptoms worsen or fail to improve.  Nobie Putnam, [facility] 04/01/2022, 10:40 AM

## 2022-04-22 ENCOUNTER — Ambulatory Visit
Admission: RE | Admit: 2022-04-22 | Discharge: 2022-04-22 | Disposition: A | Discharge status: Home / Self Care | Payer: Medicare Other | Source: Ambulatory Visit | Attending: Internal Medicine | Admitting: Internal Medicine

## 2022-04-22 DIAGNOSIS — Z780 Asymptomatic menopausal state: Secondary | ICD-10-CM | POA: Insufficient documentation

## 2022-04-22 DIAGNOSIS — M810 Age-related osteoporosis without current pathological fracture: Secondary | ICD-10-CM | POA: Diagnosis not present

## 2022-04-22 DIAGNOSIS — M8589 Other specified disorders of bone density and structure, multiple sites: Secondary | ICD-10-CM | POA: Diagnosis not present

## 2022-04-22 DIAGNOSIS — Z1231 Encounter for screening mammogram for malignant neoplasm of breast: Secondary | ICD-10-CM | POA: Diagnosis not present

## 2022-04-22 DIAGNOSIS — Z13820 Encounter for screening for osteoporosis: Secondary | ICD-10-CM | POA: Diagnosis not present

## 2022-04-27 ENCOUNTER — Other Ambulatory Visit: Payer: Self-pay | Admitting: Internal Medicine

## 2022-04-27 DIAGNOSIS — M810 Age-related osteoporosis without current pathological fracture: Secondary | ICD-10-CM

## 2022-05-06 DIAGNOSIS — M545 Low back pain, unspecified: Secondary | ICD-10-CM | POA: Diagnosis not present

## 2022-05-18 DIAGNOSIS — R040 Epistaxis: Secondary | ICD-10-CM | POA: Diagnosis not present

## 2022-05-19 ENCOUNTER — Other Ambulatory Visit: Payer: Self-pay | Admitting: Internal Medicine

## 2022-05-20 NOTE — Telephone Encounter (Signed)
Requested medication (s) are due for refill today: no  Requested medication (s) are on the active medication list: yes  Last refill:  01/15/22 #90 1 RF  Future visit scheduled: yes  Notes to clinic:  [facility] requesting 1 years worth refill-    Requested Prescriptions  Pending Prescriptions Disp Refills   omeprazole (PRILOSEC) 20 MG capsule [Pharmacy Med Name: Omeprazole 20 MG Oral Capsule Delayed Release] 90 capsule 3    Sig: TAKE 1 CAPSULE BY MOUTH DAILY     Gastroenterology: Proton Pump Inhibitors Passed - 05/19/2022 10:25 PM      Passed - Valid encounter within last 12 months    Recent Outpatient Visits           1 month ago Acute bronchitis with COPD ([place])   [place], DO   2 months ago Type 2 diabetes mellitus with peripheral neuropathy [facility])   [facility] [place], Coralie Keens, NP   3 months ago Abdominal cramping   [facility] [place], Coralie Keens, NP   7 months ago Shortness of breath   [facility] [place], Coralie Keens, NP   8 months ago Encounter for general adult medical examination with abnormal findings   [facility], Coralie Keens, NP       Future Appointments             In 1 week McGowan, Gordan Payment [place]   In 3 months [place], Coralie Keens, NP [facility]

## 2022-05-21 ENCOUNTER — Other Ambulatory Visit: Payer: Self-pay

## 2022-05-21 DIAGNOSIS — R3129 Other microscopic hematuria: Secondary | ICD-10-CM

## 2022-05-29 ENCOUNTER — Ambulatory Visit: Payer: Medicare Other | Admitting: Urology

## 2022-06-01 ENCOUNTER — Ambulatory Visit
Admission: RE | Admit: 2022-06-01 | Discharge: 2022-06-01 | Disposition: A | Discharge status: Home / Self Care | Payer: Medicare Other | Source: Ambulatory Visit | Attending: Urology | Admitting: Urology

## 2022-06-01 DIAGNOSIS — K219 Gastro-esophageal reflux disease without esophagitis: Secondary | ICD-10-CM | POA: Diagnosis not present

## 2022-06-01 DIAGNOSIS — M8008XA Age-related osteoporosis with current pathological fracture, vertebra(e), initial encounter for fracture: Secondary | ICD-10-CM | POA: Diagnosis not present

## 2022-06-01 DIAGNOSIS — I878 Other specified disorders of veins: Secondary | ICD-10-CM | POA: Diagnosis not present

## 2022-06-01 DIAGNOSIS — E7800 Pure hypercholesterolemia, unspecified: Secondary | ICD-10-CM | POA: Diagnosis not present

## 2022-06-01 DIAGNOSIS — R3129 Other microscopic hematuria: Secondary | ICD-10-CM

## 2022-06-01 DIAGNOSIS — Z87442 Personal history of urinary calculi: Secondary | ICD-10-CM | POA: Diagnosis not present

## 2022-06-01 DIAGNOSIS — M160 Bilateral primary osteoarthritis of hip: Secondary | ICD-10-CM | POA: Diagnosis not present

## 2022-06-01 DIAGNOSIS — C73 Malignant neoplasm of thyroid gland: Secondary | ICD-10-CM | POA: Diagnosis not present

## 2022-06-01 DIAGNOSIS — E039 Hypothyroidism, unspecified: Secondary | ICD-10-CM | POA: Diagnosis not present

## 2022-06-01 DIAGNOSIS — J449 Chronic obstructive pulmonary disease, unspecified: Secondary | ICD-10-CM | POA: Diagnosis not present

## 2022-06-01 DIAGNOSIS — I10 Essential (primary) hypertension: Secondary | ICD-10-CM | POA: Diagnosis not present

## 2022-06-01 NOTE — Progress Notes (Unsigned)
06/02/22 12:26 PM   Brooke King 1941/02/28 [id number]  Referring provider:  Jearld Fenton, NP [place],  [place] [address]  Urological history  1. Incontinence -contributing factors of obesity, vaginal delivery, age, smoking, vaginal atrophy and sleep apnea -PVR 0 ml -Myrbetriq 50 mg daily  2. 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 Vaginal atrophy  - Unable to tolerate vaginal estrogen cream   4. Cystocele  - evaluated by Dr. Leafy Ro (04/2022)- do not believe a pessary would stay in place, as anatomy of vaginal canal is straight from cuff to introitus- larger at opening than in the canal   HPI: Brooke King is a 81 y.o.female who presents today for a 3 month follow-up with OAB questionnaire and PVR.   She has been experiencing 8 or more daytime urinations, 1-2 nocturia with a strong urge to urinate.  She has stress urinary incontinence 1-2 times daily.  She wears 3 absorbent pads daily.  She does engage in toilet mapping.  She has been without her Myrbetriq for 2 weeks as she could not afford to pick up her prescription.  Patient denies any modifying or aggravating factors.  Patient denies any gross hematuria, dysuria or suprapubic/flank pain.  Patient denies any fevers, chills, nausea or vomiting.    She is experiencing external vaginal burning and itching.  She does use Vaseline at home to help ease this at times.  KUB (06/01/2022) no evidence of nephrolithiasis, but she did have multiple phleboliths making distal ureteral stones difficult to exclude.    PMH: Past Medical History:  Diagnosis Date   Allergy    B12 deficiency    Chest pain syndrome    COPD (chronic obstructive pulmonary disease) (HCC)    Depression    GERD (gastroesophageal reflux disease)    Glaucoma    History of colonic polyps    History of degenerative disc disease    c-spine, status post cervical laminectomy   Hyperlipidemia    Hypertension    Hypothyroidism    Insomnia     Migraine    Restless leg syndrome    Seasonal allergies    Sleep apnea    Thyroid cancer ([place])    Urinary incontinence     Surgical History: Past Surgical History:  Procedure Laterality Date   ABDOMINAL HYSTERECTOMY     CATARACT EXTRACTION, BILATERAL     CERVICAL LAMINECTOMY  08/2004   CHOLECYSTECTOMY     COLONOSCOPY WITH PROPOFOL N/A 11/18/2015   Procedure: COLONOSCOPY WITH PROPOFOL;  Surgeon: Hulen Luster, MD;  Location[facility] ENDOSCOPY;  Service: Gastroenterology;  Laterality: N/A;   ESOPHAGOGASTRODUODENOSCOPY (EGD) WITH PROPOFOL N/A 11/18/2015   Procedure: ESOPHAGOGASTRODUODENOSCOPY (EGD) WITH PROPOFOL;  Surgeon: Hulen Luster, MD;  Location: [facility] ENDOSCOPY;  Service: Gastroenterology;  Laterality: N/A;   HEMORRHOID SURGERY     TUBAL LIGATION      Home Medications:  Allergies as of 06/02/2022       Reactions   Toviaz [fesoterodine Fumarate Er] Other (See Comments)   Throat closed   Cortisone    Doxycycline Hyclate    Hydrocodone    Keflex [cephalexin]    Septra [sulfamethoxazole-trimethoprim]    Tagamet [cimetidine]    Zoloft [sertraline]    Penicillins Rash        Medication List        Accurate as of June 02, 2022 12:26 PM. If you have any questions, ask your nurse or doctor.  STOP taking these medications    azithromycin 250 MG tablet Commonly known as: Zithromax Z-Pak       TAKE these medications    acetaminophen 500 MG tablet Commonly known as: TYLENOL Take 500 mg by mouth every 6 (six) hours as needed.   albuterol 108 (90 Base) MCG/ACT inhaler Commonly known as: VENTOLIN HFA USE 1 TO 2 INHALATIONS BY MOUTH  INTO THE LUNGS EVERY 6 HOURS AS  NEEDED FOR WHEEZING OR SHORTNESS OF BREATH   atorvastatin 10 MG tablet Commonly known as: LIPITOR TAKE 1 TABLET BY MOUTH  DAILY   Calcium + D3 600-200 MG-UNIT Tabs Take by mouth.   Cranberry 180 MG Caps Take by mouth.   cyanocobalamin 1000 MCG tablet Commonly known as: VITAMIN B12 Take  1,000 mcg by mouth daily.   fexofenadine 180 MG tablet Commonly known as: ALLEGRA Take 180 mg by mouth daily.   gabapentin 300 MG capsule Commonly known as: NEURONTIN TAKE 1 CAPSULE BY MOUTH 3  TIMES DAILY   hydrOXYzine 10 MG tablet Commonly known as: ATARAX Take 1 tablet (10 mg total) by mouth daily as needed.   ipratropium 0.03 % nasal spray Commonly known as: ATROVENT Place into both nostrils.   latanoprost 0.005 % ophthalmic solution Commonly known as: XALATAN Place 1 drop into both eyes at bedtime.   mirabegron ER 50 MG Tb24 tablet Commonly known as: MYRBETRIQ Take 1 tablet (50 mg total) by mouth daily.   montelukast 10 MG tablet Commonly known as: SINGULAIR Take 1 tablet (10 mg total) by mouth at bedtime.   omeprazole 20 MG capsule Commonly known as: PRILOSEC TAKE 1 CAPSULE BY MOUTH DAILY   predniSONE 20 MG tablet Commonly known as: DELTASONE Take daily with food. Start with '60mg'$  (3 pills) x 2 days, then reduce to '40mg'$  (2 pills) x 2 days, then '20mg'$  (1 pill) x 3 days   Synthroid 112 MCG tablet Generic drug: levothyroxine TAKE 1 TABLET BY MOUTH  DAILY BEFORE BREAKFAST   tiotropium 18 MCG inhalation capsule Commonly known as: SPIRIVA Place 18 mcg into inhaler and inhale daily.   traZODone 50 MG tablet Commonly known as: DESYREL Take 0.5 tablets (25 mg total) by mouth at bedtime as needed for sleep.   triamcinolone cream 0.1 % Commonly known as: KENALOG Apply 1 application. topically 2 (two) times daily.   valsartan 80 MG tablet Commonly known as: DIOVAN Take 1 tablet (80 mg total) by mouth daily.        Allergies:  Allergies  Allergen Reactions   Toviaz [Fesoterodine Fumarate Er] Other (See Comments)    Throat closed   Cortisone    Doxycycline Hyclate    Hydrocodone    Keflex [Cephalexin]    Septra [Sulfamethoxazole-Trimethoprim]    Tagamet [Cimetidine]    Zoloft [Sertraline]    Penicillins Rash    Family History: Family History   Problem Relation Age of Onset   Heart disease Mother    Breast cancer Neg Hx     Social History:  reports that she has been smoking cigarettes. She has a 40.00 pack-year smoking history. She has never used smokeless tobacco. She reports that she does not drink alcohol and does not use drugs.   Physical Exam: BP 130/69   Pulse 80   Ht '5\' 3"'$  (1.6 m)   Wt 178 lb (80.7 kg)   BMI 31.53 kg/m   Constitutional:  Well nourished. Alert and oriented, No acute distress. HEENT: [place] AT, moist mucus membranes.  Trachea  midline Cardiovascular: No clubbing, cyanosis, or edema. Respiratory: Normal respiratory effort, no increased work of breathing. Neurologic: Grossly intact, no focal deficits, moving all 4 extremities. Psychiatric: Normal mood and affect.    Laboratory Data: N/A   Pertinent Imaging: CLINICAL DATA:  History of kidney stones.  Microscopic hematuria.   EXAM: ABDOMEN - 1 VIEW   COMPARISON:  Lumbar spine series 03/09/2022. Ultrasound abdomen 10/31/2015. CT abdomen pelvis 11/23/2008.   FINDINGS: Surgical clips right upper quadrant. Gas pattern nonspecific. Stool noted throughout the colon. No free air. No evidence of nephrolithiasis. Multiple pelvic calcifications are again noted, most consistent phleboliths. Distal ureteral stones cannot be completely excluded. Multiple abdominopelvic soft tissue calcifications consistent with injection granulomas. Diffuse osteopenia. Degenerative changes in scoliosis lumbar spine. Degenerative changes both hips.   IMPRESSION: No evidence of nephrolithiasis. Multiple pelvic calcifications are again noted, most consistent phleboliths. Distal ureteral stones cannot be completely excluded.     Electronically Signed   By: Marcello Moores  Register M.D.   On: 06/02/2022 09:24    06/02/22 09:41  Scan Result 91m  I have independently reviewed the films.  See HPI.    Assessment & Plan:    1. Mixed Incontinence - PVR 0 mL -adequate bladder  emptying -Has been without Myrbetriq 50 mg for 2 weeks -she will pick up her prescription tomorrow -Given samples at today's visit to help offset cost  2. Cystocele  -Evaluated by gynecology and they felt the anatomy in the vaginal canal would not allow [place] to stay in place -We will manage conservatively  3. Vaginal atrophy -could not tolerate the vaginal estrogen cream  -Advised her to use olive oil as well for the discomfort  4. Lower abdominal discomfort -No obvious nephrolithiasis seen on today's KUB -Explained that sometimes phleboliths this can mask ureteral stones -Offered CT renal stone study, but she deferred at this time citing multiple doctor visits with the last month and that she needs a break    Return in about 6 months (around 12/01/2022) for PVR and OAB questionnaire.  Mariaelena Cade, [place]ast [place] [place] [phone]

## 2022-06-02 ENCOUNTER — Encounter: Payer: Self-pay | Admitting: Urology

## 2022-06-02 ENCOUNTER — Ambulatory Visit: Payer: Medicare Other | Admitting: Urology

## 2022-06-02 VITALS — BP 130/69 | HR 80 | Ht 63.0 in | Wt 178.0 lb

## 2022-06-02 DIAGNOSIS — N3946 Mixed incontinence: Secondary | ICD-10-CM

## 2022-06-02 DIAGNOSIS — N952 Postmenopausal atrophic vaginitis: Secondary | ICD-10-CM

## 2022-06-02 DIAGNOSIS — R109 Unspecified abdominal pain: Secondary | ICD-10-CM

## 2022-06-02 DIAGNOSIS — N8111 Cystocele, midline: Secondary | ICD-10-CM

## 2022-06-02 LAB — BLADDER SCAN AMB NON-IMAGING

## 2022-06-16 DIAGNOSIS — E039 Hypothyroidism, unspecified: Secondary | ICD-10-CM | POA: Diagnosis not present

## 2022-06-16 DIAGNOSIS — E1165 Type 2 diabetes mellitus with hyperglycemia: Secondary | ICD-10-CM | POA: Diagnosis not present

## 2022-06-16 DIAGNOSIS — R7301 Impaired fasting glucose: Secondary | ICD-10-CM | POA: Diagnosis not present

## 2022-06-16 DIAGNOSIS — K219 Gastro-esophageal reflux disease without esophagitis: Secondary | ICD-10-CM | POA: Diagnosis not present

## 2022-06-16 DIAGNOSIS — I10 Essential (primary) hypertension: Secondary | ICD-10-CM | POA: Diagnosis not present

## 2022-06-16 DIAGNOSIS — M8008XA Age-related osteoporosis with current pathological fracture, vertebra(e), initial encounter for fracture: Secondary | ICD-10-CM | POA: Diagnosis not present

## 2022-06-16 DIAGNOSIS — E7800 Pure hypercholesterolemia, unspecified: Secondary | ICD-10-CM | POA: Diagnosis not present

## 2022-06-26 DIAGNOSIS — H401121 Primary open-angle glaucoma, left eye, mild stage: Secondary | ICD-10-CM | POA: Diagnosis not present

## 2022-06-30 ENCOUNTER — Encounter: Payer: Self-pay | Admitting: Internal Medicine

## 2022-06-30 ENCOUNTER — Ambulatory Visit (INDEPENDENT_AMBULATORY_CARE_PROVIDER_SITE_OTHER): Payer: Medicare Other | Admitting: Internal Medicine

## 2022-06-30 VITALS — BP 138/84 | HR 75 | Temp 97.1°F | Wt 177.0 lb

## 2022-06-30 DIAGNOSIS — J441 Chronic obstructive pulmonary disease with (acute) exacerbation: Secondary | ICD-10-CM | POA: Diagnosis not present

## 2022-06-30 MED ORDER — AZITHROMYCIN 250 MG PO TABS: ORAL_TABLET | ORAL | 0 refills | Status: DC

## 2022-06-30 MED ORDER — PREDNISONE 10 MG PO TABS: ORAL_TABLET | ORAL | 0 refills | Status: DC

## 2022-06-30 MED ORDER — PROMETHAZINE-DM 6.25-15 MG/5ML PO SYRP: 5.0000 mL | ORAL_SOLUTION | Freq: Every evening | ORAL | 0 refills | Status: DC | PRN

## 2022-06-30 NOTE — Patient Instructions (Signed)

## 2022-06-30 NOTE — Progress Notes (Signed)
Subjective:    Patient ID: Brooke King, female    DOB: [birth year], 81 y.o.   MRN: [id number]  HPI  Patient presents to clinic today with complaint of headache, runny nose, nasal congestion, cough, shortness of breath and nausea. This started 5 days ago. The headache is located on the left side of her head. She describes the pain as pressure. She is not blowing anything out of her nose. The cough is productive of yellow mucous. She denies ear pain, sore throat, chest pain, vomiting or diarrhea. She denies fever, chills or body aches. She has tried Tylenol OTC with minimal relief of symptoms. She did not take a covid test.  She has a history of COPD, managed on Singulair, Atrovent, Spiriva and Albuterol. She has not had sick contacts that she is aware of.  Review of Systems     Past Medical History:  Diagnosis Date   Allergy    B12 deficiency    Chest pain syndrome    COPD (chronic obstructive pulmonary disease) (HCC)    Depression    GERD (gastroesophageal reflux disease)    Glaucoma    History of colonic polyps    History of degenerative disc disease    c-spine, status post cervical laminectomy   Hyperlipidemia    Hypertension    Hypothyroidism    Insomnia    Migraine    Restless leg syndrome    Seasonal allergies    Sleep apnea    Thyroid cancer (HCC)    Urinary incontinence     Current Outpatient Medications  Medication Sig Dispense Refill   acetaminophen (TYLENOL) 500 MG tablet Take 500 mg by mouth every 6 (six) hours as needed.     albuterol (VENTOLIN HFA) 108 (90 Base) MCG/ACT inhaler USE 1 TO 2 INHALATIONS BY MOUTH  INTO THE LUNGS EVERY 6 HOURS AS  NEEDED FOR WHEEZING OR SHORTNESS OF BREATH 34 g 3   atorvastatin (LIPITOR) 10 MG tablet TAKE 1 TABLET BY MOUTH  DAILY 90 tablet 1   Calcium Carb-Cholecalciferol (CALCIUM + D3) 600-200 MG-UNIT TABS Take by mouth.     Cranberry 180 MG CAPS Take by mouth.     fexofenadine (ALLEGRA) 180 MG tablet Take 180 mg by mouth daily.      gabapentin (NEURONTIN) 300 MG capsule TAKE 1 CAPSULE BY MOUTH 3  TIMES DAILY 270 capsule 3   hydrOXYzine (ATARAX/VISTARIL) 10 MG tablet Take 1 tablet (10 mg total) by mouth daily as needed. 30 tablet 0   ipratropium (ATROVENT) 0.03 % nasal spray Place into both nostrils.     latanoprost (XALATAN) 0.005 % ophthalmic solution Place 1 drop into both eyes at bedtime.     mirabegron ER (MYRBETRIQ) 50 MG TB24 tablet Take 1 tablet (50 mg total) by mouth daily. 30 tablet 2   montelukast (SINGULAIR) 10 MG tablet Take 1 tablet (10 mg total) by mouth at bedtime. 90 tablet 1   omeprazole (PRILOSEC) 20 MG capsule TAKE 1 CAPSULE BY MOUTH DAILY 90 capsule 3   predniSONE (DELTASONE) 20 MG tablet Take daily with food. Start with '60mg'$  (3 pills) x 2 days, then reduce to '40mg'$  (2 pills) x 2 days, then '20mg'$  (1 pill) x 3 days (Patient not taking: Reported on 06/02/2022) 13 tablet 0   SYNTHROID 112 MCG tablet TAKE 1 TABLET BY MOUTH  DAILY BEFORE BREAKFAST 90 tablet 2   tiotropium (SPIRIVA) 18 MCG inhalation capsule Place 18 mcg into inhaler and inhale daily.  traZODone (DESYREL) 50 MG tablet Take 0.5 tablets (25 mg total) by mouth at bedtime as needed for sleep. 45 tablet 0   triamcinolone cream (KENALOG) 0.1 % Apply 1 application. topically 2 (two) times daily. 30 g 0   valsartan (DIOVAN) 80 MG tablet Take 1 tablet (80 mg total) by mouth daily. 90 tablet 1   vitamin B-12 (CYANOCOBALAMIN) 1000 MCG tablet Take 1,000 mcg by mouth daily.     No current facility-administered medications for this visit.    Allergies  Allergen Reactions   Toviaz [Fesoterodine Fumarate Er] Other (See Comments)    Throat closed   Cortisone    Doxycycline Hyclate    Hydrocodone    Keflex [Cephalexin]    Septra [Sulfamethoxazole-Trimethoprim]    Tagamet [Cimetidine]    Zoloft [Sertraline]    Penicillins Rash    Family History  Problem Relation Age of Onset   Heart disease Mother    Breast cancer Neg Hx     Social History    Socioeconomic History   Marital status: Widowed    Spouse name: Not on file   Number of children: Not on file   Years of education: Not on file   Highest education level: Not on file  Occupational History   Occupation: Retired  Tobacco Use   Smoking status: Every Day    Packs/day: 1.00    Years: 40.00    Total pack years: 40.00    Types: Cigarettes   Smokeless tobacco: Never  Vaping Use   Vaping Use: Never used  Substance and Sexual Activity   Alcohol use: No   Drug use: No   Sexual activity: Not on file  Other Topics Concern   Not on file  Social History Narrative   Not on file   Social Determinants of Health   Financial Resource Strain: Low Risk  (03/09/2022)   Overall Financial Resource Strain (CARDIA)    Difficulty of Paying Living Expenses: Not hard at all  Food Insecurity: No Food Insecurity (03/09/2022)   Hunger Vital Sign    Worried About Running Out of Food in the Last Year: Never true    [place] in the Last Year: Never true  Transportation Needs: No Transportation Needs (03/09/2022)   PRAPARE - Hydrologist (Medical): No    Lack of Transportation (Non-Medical): No  Physical Activity: Sufficiently Active (03/09/2022)   Exercise Vital Sign    Days of Exercise per Week: 7 days    Minutes of Exercise per Session: 30 min  Stress: Stress Concern Present (03/09/2022)   [place]    Feeling of Stress : To some extent  Social Connections: Not on file  Intimate Partner Violence: Not At Risk (03/09/2022)   Humiliation, Afraid, Rape, and Kick questionnaire    Fear of Current or Ex-Partner: No    Emotionally Abused: No    Physically Abused: No    Sexually Abused: No     Constitutional: Pt reports headache. Denies fever, malaise, fatigue, or abrupt weight changes.  HEENT: Pt reports runny nose, sore throat. Denies eye pain, eye redness, ear pain, ringing in the  ears, wax buildup, nasal congestion, bloody nose. Respiratory: Patient reports cough and shortness of breath.  Denies difficulty breathing.   Cardiovascular: Denies chest pain, chest tightness, palpitations or swelling in the hands or feet.  Gastrointestinal: Pt reports nausea. Denies abdominal pain, bloating, constipation, diarrhea or blood in the stool.  No other specific complaints in a complete review of systems (except as listed in HPI above).  Objective:   Physical Exam  BP 138/84 (BP Location: Left Arm, Patient Position: Sitting, Cuff Size: Normal)   Pulse 75   Temp (!) 97.1 F (36.2 C) (Temporal)   Wt 177 lb (80.3 kg)   SpO2 100%   BMI 31.35 kg/m   Wt Readings from Last 3 Encounters:  06/02/22 178 lb (80.7 kg)  04/01/22 179 lb (81.2 kg)  03/09/22 178 lb (80.7 kg)    General: Appears her stated age, obese, in NAD. Skin: Warm, dry and intact.  HEENT: Head: normal shape and size, no sinus tenderness noted; Eyes: sclera [place], no icterus, conjunctiva pink, PERRLA and EOMs intact; Throat/Mouth: Teeth present, mucosa pink and moist, + PND, no exudate, lesions or ulcerations noted.  Neck: No adenopathy noted Cardiovascular: Normal rate and rhythm. S1,S2 noted.  No murmur, rubs or gallops noted.  Pulmonary/Chest: Normal effort and coarse vesicular breath sounds with bilateral expiratory wheezing and scattered rhonchi.  Neurological: Alert and oriented.   BMET    Component Value Date/Time   NA 142 02/10/2022 1333   K 3.9 02/10/2022 1333   K 3.8 12/15/2013 1202   CL 106 02/10/2022 1333   CO2 27 02/10/2022 1333   GLUCOSE 92 02/10/2022 1333   BUN 13 02/10/2022 1333   CREATININE 0.97 (H) 02/10/2022 1333   CALCIUM 9.3 02/10/2022 1333   GFRNONAA >60 01/19/2012 0949   GFRAA >60 01/19/2012 0949    Lipid Panel     Component Value Date/Time   CHOL 157 09/04/2021 1021   TRIG 101 09/04/2021 1021   HDL 53 09/04/2021 1021   CHOLHDL 3.0 09/04/2021 1021   LDLCALC 85 09/04/2021  1021    CBC    Component Value Date/Time   WBC 7.0 02/10/2022 1333   RBC 4.41 02/10/2022 1333   HGB 13.8 02/10/2022 1333   HCT 40.7 02/10/2022 1333   PLT 140 02/10/2022 1333   MCV 92.3 02/10/2022 1333   MCH 31.3 02/10/2022 1333   MCHC 33.9 02/10/2022 1333   RDW 12.4 02/10/2022 1333   LYMPHSABS 2,310 02/10/2022 1333   EOSABS 392 02/10/2022 1333   BASOSABS 91 02/10/2022 1333    Hgb A1C Lab Results  Component Value Date   HGBA1C 6.4 (A) 03/09/2022           Assessment & Plan:   COPD Exacerbation:  Encouraged rest and fluids Continue Singulair, Atrovent, Spiriva and albuterol as previously prescribed Rx for Pred taper x6 days Rx for Zithromax 250 mg x 5 days Rx for Promethazine DM cough syrup No indication to check for COVID or flu given the duration of her symptoms, she would not qualify for antiviral therapy  RTC in 2 months for your annual exam Webb Silversmith, NP

## 2022-07-13 DIAGNOSIS — C73 Malignant neoplasm of thyroid gland: Secondary | ICD-10-CM | POA: Diagnosis not present

## 2022-07-13 DIAGNOSIS — K219 Gastro-esophageal reflux disease without esophagitis: Secondary | ICD-10-CM | POA: Diagnosis not present

## 2022-07-13 DIAGNOSIS — E7800 Pure hypercholesterolemia, unspecified: Secondary | ICD-10-CM | POA: Diagnosis not present

## 2022-07-13 DIAGNOSIS — J449 Chronic obstructive pulmonary disease, unspecified: Secondary | ICD-10-CM | POA: Diagnosis not present

## 2022-07-13 DIAGNOSIS — E039 Hypothyroidism, unspecified: Secondary | ICD-10-CM | POA: Diagnosis not present

## 2022-07-13 DIAGNOSIS — I10 Essential (primary) hypertension: Secondary | ICD-10-CM | POA: Diagnosis not present

## 2022-07-13 DIAGNOSIS — E1165 Type 2 diabetes mellitus with hyperglycemia: Secondary | ICD-10-CM | POA: Diagnosis not present

## 2022-07-13 DIAGNOSIS — M8008XA Age-related osteoporosis with current pathological fracture, vertebra(e), initial encounter for fracture: Secondary | ICD-10-CM | POA: Diagnosis not present

## 2022-07-15 ENCOUNTER — Ambulatory Visit: Payer: Self-pay

## 2022-07-15 NOTE — Telephone Encounter (Signed)
  Chief Complaint: low BP Symptoms: none Frequency: days Pertinent Negatives: Patient denies has been on abx 06/30/22 Disposition: '[]'$ ED /'[]'$ Urgent Care (no appt availability in office) / '[x]'$ Appointment(In office/virtual)/ '[]'$  [facility]/ '[]'$ Home Care/ '[]'$ Refused Recommended Disposition /'[]'$ [facility]/ '[]'$  Follow-up with PCP Additional Notes: n/a Reason for Disposition  [5] Fall in systolic BP > 20 mm Hg from normal AND [2] NOT dizzy, lightheaded, or weak  Answer Assessment - Initial Assessment Questions 1. BLOOD PRESSURE: "What is the blood pressure?" "Did you take at least two measurements 5 minutes apart?"     Yesterday 98/60 and 83/37 2. ONSET: "When did you take your blood pressure?"     110/64 107/53 3. HOW: "How did you obtain the blood pressure?" (e.g., visiting nurse, automatic home BP monitor)     Wrist on  4. HISTORY: "Do you have a history of low blood pressure?" "What is your blood pressure normally?"     yes 5. MEDICINES: "Are you taking any medications for blood pressure?" If Yes, ask: "Have they been changed recently?"     No stopped 6. PULSE RATE: "Do you know what your pulse rate is?"      85 7. OTHER SYMPTOMS: "Have you been sick recently?" "Have you had a recent injury?"     06/30/22 abx 8. PREGNANCY: "Is there any chance you are pregnant?" "When was your last menstrual period?"     N/a  Protocols used: Blood Pressure - Low-A-AH

## 2022-07-17 ENCOUNTER — Ambulatory Visit (INDEPENDENT_AMBULATORY_CARE_PROVIDER_SITE_OTHER): Payer: Medicare Other | Admitting: Internal Medicine

## 2022-07-17 ENCOUNTER — Encounter: Payer: Self-pay | Admitting: Internal Medicine

## 2022-07-17 VITALS — BP 142/84 | HR 77 | Temp 96.2°F | Wt 177.0 lb

## 2022-07-17 DIAGNOSIS — E6609 Other obesity due to excess calories: Secondary | ICD-10-CM

## 2022-07-17 DIAGNOSIS — Z6831 Body mass index (BMI) 31.0-31.9, adult: Secondary | ICD-10-CM | POA: Diagnosis not present

## 2022-07-17 DIAGNOSIS — I10 Essential (primary) hypertension: Secondary | ICD-10-CM

## 2022-07-17 DIAGNOSIS — J410 Simple chronic bronchitis: Secondary | ICD-10-CM

## 2022-07-17 DIAGNOSIS — T887XXA Unspecified adverse effect of drug or medicament, initial encounter: Secondary | ICD-10-CM

## 2022-07-17 MED ORDER — TRELEGY ELLIPTA 100-62.5-25 MCG/ACT IN AEPB: 1.0000 | INHALATION_SPRAY | Freq: Every day | RESPIRATORY_TRACT | 0 refills | Status: DC

## 2022-07-17 MED ORDER — BREZTRI AEROSPHERE 160-9-4.8 MCG/ACT IN AERO: 2.0000 | INHALATION_SPRAY | Freq: Two times a day (BID) | RESPIRATORY_TRACT | 5 refills | Status: DC

## 2022-07-17 NOTE — Patient Instructions (Signed)
COPD and Physical Activity ?Chronic obstructive pulmonary disease (COPD) is a long-term, or chronic, condition that affects the lungs. COPD is a general term that can be used to describe many problems that cause inflammation of the lungs and limit airflow. These conditions include chronic bronchitis and emphysema. ?The main symptom of COPD is shortness of breath, which makes it harder to do even simple tasks. This can also make it harder to exercise and stay active. Talk with your health care provider about treatments to help you breathe better and actions you can take to prevent breathing problems during physical activity. ?What are the benefits of exercising when you have COPD? ?Exercising regularly is an important part of a healthy lifestyle. You can still exercise and do physical activities even though you have COPD. Exercise and physical activity improve your shortness of breath by increasing blood flow (circulation). This causes your heart to pump more oxygen through your body. Moderate exercise can: ?Improve oxygen use. ?Increase your energy level. ?Help with shortness of breath. ?Strengthen your breathing muscles. ?Improve heart health. ?Help with sleep. ?Improve your self-esteem and feelings of self-worth. ?Lower depression, stress, and anxiety. ?Exercise can benefit everyone with COPD. The severity of your disease may affect how hard you can exercise, especially at first, but everyone can benefit. Talk with your health care provider about how much exercise is safe for you, and which activities and exercises are safe for you. ?What actions can I take to prevent breathing problems during physical activity? ?Sign up for a pulmonary rehabilitation program. This type of program may include: ?Education about lung diseases. ?Exercise classes that teach you how to exercise and be more active while improving your breathing. This usually involves: ?Exercise using your lower extremities, such as a stationary  bicycle. ?About 30 minutes of exercise, 2 to 5 times per week, for 6 to 12 weeks. ?Strength training, such as push-ups or leg lifts. ?Nutrition education. ?Group classes in which you can talk with others who also have COPD and learn ways to manage stress. ?If you use an oxygen tank, you should use it while you exercise. Work with your health care provider to adjust your oxygen for your physical activity. Your resting flow rate is different from your flow rate during physical activity. ?How to manage your breathing while exercising ?While you are exercising: ?Take slow breaths. ?Pace yourself, and do nottry to go too fast. ?Purse your lips while breathing out. Pursing your lips is similar to a kissing or whistling position. ?If doing exercise that uses a quick burst of effort, such as weight lifting: ?Breathe in before starting the exercise. ?Breathe out during the hardest part of the exercise, such as raising the weights. ?Where to find support ?You can find support for exercising with COPD from: ?Your health care provider. ?A pulmonary rehabilitation program. ?Your local health department or community health programs. ?Support groups, either online or in-person. Your health care provider may be able to recommend support groups. ?Where to find more information ?You can find more information about exercising with COPD from: ?American Lung Association: lung.org ?COPD Foundation: copdfoundation.org ?Contact a health care provider if: ?Your symptoms get worse. ?You have nausea. ?You have a fever. ?You want to start a new exercise program or a new activity. ?Get help right away if: ?You have chest pain. ?You cannot breathe. ?These symptoms may represent a serious problem that is an emergency. Do not wait to see if the symptoms will go away. Get medical help right away. Call   your local emergency services (911 in the [place] Do not drive yourself to the hospital. ?Summary ?COPD is a general term that can be used to describe  many different lung problems that cause lung inflammation and limit airflow. This includes chronic bronchitis and emphysema. ?Exercise and physical activity improve your shortness of breath by increasing blood flow (circulation). This causes your heart to provide more oxygen to your body. ?Contact your health care provider before starting any exercise program or new activity. Ask your health care provider what exercises and activities are safe for you. ?This information is not intended to replace advice given to you by your health care provider. Make sure you discuss any questions you have with your health care provider. ?Document Revised: 07/16/2020 Document Reviewed: 07/16/2020 ?Elsevier Patient Education ? 2023 Elsevier Inc. ? ?

## 2022-07-17 NOTE — Assessment & Plan Note (Signed)
Encourage diet and exercise for weight loss 

## 2022-07-17 NOTE — Progress Notes (Signed)
Subjective:    Patient ID: Brooke King, female    DOB: 06-26-1941, 81 y.o.   MRN: [id number]  HPI  Patient presents to clinic today with complaint of low blood pressures.  She is taking Valsartan as prescribed.  Her BP today is 148/88. Her BP at home ranges 102/69-140/86. She was feeling lightheaded, jittery, lack of appetite with increased reflux. She did get a Reclast infusion on Monday and thinks this could be a contributing factor. ECG from 11/2013 reviewed.  She reports she also got a call from her insurance company saying that she needed to be started on daily maintenance inhaler for her COPD.  She is currently only taken Albuterol as prescribed.  She was prescribed Spiriva in the past but did not like this.  She has a chronic cough but denies shortness of breath.  Review of Systems     Past Medical History:  Diagnosis Date   Allergy    B12 deficiency    Chest pain syndrome    COPD (chronic obstructive pulmonary disease) (HCC)    Depression    GERD (gastroesophageal reflux disease)    Glaucoma    History of colonic polyps    History of degenerative disc disease    c-spine, status post cervical laminectomy   Hyperlipidemia    Hypertension    Hypothyroidism    Insomnia    Migraine    Restless leg syndrome    Seasonal allergies    Sleep apnea    Thyroid cancer (HCC)    Urinary incontinence     Current Outpatient Medications  Medication Sig Dispense Refill   acetaminophen (TYLENOL) 500 MG tablet Take 500 mg by mouth every 6 (six) hours as needed.     albuterol (VENTOLIN HFA) 108 (90 Base) MCG/ACT inhaler USE 1 TO 2 INHALATIONS BY MOUTH  INTO THE LUNGS EVERY 6 HOURS AS  NEEDED FOR WHEEZING OR SHORTNESS OF BREATH 34 g 3   atorvastatin (LIPITOR) 10 MG tablet TAKE 1 TABLET BY MOUTH  DAILY 90 tablet 1   azithromycin (ZITHROMAX) 250 MG tablet Take 2 tabs today, then 1 tab daily x 4 days 6 tablet 0   Calcium Carb-Cholecalciferol (CALCIUM + D3) 600-200 MG-UNIT TABS Take by  mouth.     Cranberry 180 MG CAPS Take by mouth.     fexofenadine (ALLEGRA) 180 MG tablet Take 180 mg by mouth daily.     gabapentin (NEURONTIN) 300 MG capsule TAKE 1 CAPSULE BY MOUTH 3  TIMES DAILY 270 capsule 3   hydrOXYzine (ATARAX/VISTARIL) 10 MG tablet Take 1 tablet (10 mg total) by mouth daily as needed. 30 tablet 0   ipratropium (ATROVENT) 0.03 % nasal spray Place into both nostrils.     latanoprost (XALATAN) 0.005 % ophthalmic solution Place 1 drop into both eyes at bedtime.     mirabegron ER (MYRBETRIQ) 50 MG TB24 tablet Take 1 tablet (50 mg total) by mouth daily. 30 tablet 2   montelukast (SINGULAIR) 10 MG tablet Take 1 tablet (10 mg total) by mouth at bedtime. 90 tablet 1   omeprazole (PRILOSEC) 20 MG capsule TAKE 1 CAPSULE BY MOUTH DAILY 90 capsule 3   predniSONE (DELTASONE) 10 MG tablet Take 6 tabs on day 1, 5 tabs on day 2, 4 tabs on day 3, 3 tabs on day 4, 2 tabs on day 5, 1 tab on day 6 21 tablet 0   promethazine-dextromethorphan (PROMETHAZINE-DM) 6.25-15 MG/5ML syrup Take 5 mLs by mouth at bedtime as needed  for cough. 118 mL 0   SYNTHROID 112 MCG tablet TAKE 1 TABLET BY MOUTH  DAILY BEFORE BREAKFAST 90 tablet 2   tiotropium (SPIRIVA) 18 MCG inhalation capsule Place 18 mcg into inhaler and inhale daily.     traZODone (DESYREL) 50 MG tablet Take 0.5 tablets (25 mg total) by mouth at bedtime as needed for sleep. 45 tablet 0   triamcinolone cream (KENALOG) 0.1 % Apply 1 application. topically 2 (two) times daily. 30 g 0   valsartan (DIOVAN) 80 MG tablet Take 1 tablet (80 mg total) by mouth daily. 90 tablet 1   vitamin B-12 (CYANOCOBALAMIN) 1000 MCG tablet Take 1,000 mcg by mouth daily.     No current facility-administered medications for this visit.    Allergies  Allergen Reactions   Toviaz [Fesoterodine Fumarate Er] Other (See Comments)    Throat closed   Cortisone    Doxycycline Hyclate    Hydrocodone    Keflex [Cephalexin]    Septra [Sulfamethoxazole-Trimethoprim]     Tagamet [Cimetidine]    Zoloft [Sertraline]    Penicillins Rash    Family History  Problem Relation Age of Onset   Heart disease Mother    Breast cancer Neg Hx     Social History   Socioeconomic History   Marital status: Widowed    Spouse name: Not on file   Number of children: Not on file   Years of education: Not on file   Highest education level: Not on file  Occupational History   Occupation: Retired  Tobacco Use   Smoking status: Every Day    Packs/day: 1.00    Years: 40.00    Total pack years: 40.00    Types: Cigarettes   Smokeless tobacco: Never  Vaping Use   Vaping Use: Never used  Substance and Sexual Activity   Alcohol use: No   Drug use: No   Sexual activity: Not on file  Other Topics Concern   Not on file  Social History Narrative   Not on file   Social Determinants of Health   Financial Resource Strain: Low Risk  (03/09/2022)   Overall Financial Resource Strain (CARDIA)    Difficulty of Paying Living Expenses: Not hard at all  Food Insecurity: No Food Insecurity (03/09/2022)   Hunger Vital Sign    Worried About Running Out of Food in the Last Year: Never true    [place] in the Last Year: Never true  Transportation Needs: No Transportation Needs (03/09/2022)   PRAPARE - Hydrologist (Medical): No    Lack of Transportation (Non-Medical): No  Physical Activity: Sufficiently Active (03/09/2022)   Exercise Vital Sign    Days of Exercise per Week: 7 days    Minutes of Exercise per Session: 30 min  Stress: Stress Concern Present (03/09/2022)   [place]    Feeling of Stress : To some extent  Social Connections: Not on file  Intimate Partner Violence: Not At Risk (03/09/2022)   Humiliation, Afraid, Rape, and Kick questionnaire    Fear of Current or Ex-Partner: No    Emotionally Abused: No    Physically Abused: No    Sexually Abused: No      Constitutional: Denies fever, malaise, fatigue, headache or abrupt weight changes.  HEENT: Denies eye pain, eye redness, ear pain, ringing in the ears, wax buildup, runny nose, nasal congestion, bloody nose, or sore throat. Respiratory: Pt reports chronic  cough. Denies difficulty breathing, shortness of breath, or sputum production.   Cardiovascular: Denies chest pain, chest tightness, palpitations or swelling in the hands or feet.  Gastrointestinal: Pt reports increased reflux, decreased appetite. Denies abdominal pain, bloating, constipation, diarrhea or blood in the stool.  GU: Denies urgency, frequency, pain with urination, burning sensation, blood in urine, odor or discharge. Musculoskeletal: Denies decrease in range of motion, difficulty with gait, muscle pain or joint pain and swelling.  Skin: Denies redness, rashes, lesions or ulcercations.  Neurological: Pt reports intermittent lightheadedness. Denies dizziness, difficulty with memory, difficulty with speech or problems with balance and coordination.  Psych: Denies anxiety, depression, SI/HI.  No other specific complaints in a complete review of systems (except as listed in HPI above).  Objective:   Physical Exam BP (!) 142/84 (BP Location: Right Arm, Patient Position: Sitting, Cuff Size: Normal)   Pulse 77   Temp (!) 96.2 F (35.7 C) (Temporal)   Wt 177 lb (80.3 kg)   SpO2 100%   BMI 31.35 kg/m   Wt Readings from Last 3 Encounters:  06/30/22 177 lb (80.3 kg)  06/02/22 178 lb (80.7 kg)  04/01/22 179 lb (81.2 kg)    General: Appears her stated age, obese, in NAD. Cardiovascular: Normal rate and rhythm. S1,S2 noted.  No murmur, rubs or gallops noted. No JVD or BLE edema. No carotid bruits noted. Pulmonary/Chest: Normal effort and positive vesicular breath sounds with intermittent expiratory wheeze. No respiratory distress. No rales or ronchi noted.  Abdomen: Soft and nontender. Normal bowel sounds. No distention or  masses noted. Liver, spleen and kidneys non palpable. Musculoskeletal: No difficulty with gait.  Neurological: Alert and oriented. Coordination normal.    BMET    Component Value Date/Time   NA 142 02/10/2022 1333   K 3.9 02/10/2022 1333   K 3.8 12/15/2013 1202   CL 106 02/10/2022 1333   CO2 27 02/10/2022 1333   GLUCOSE 92 02/10/2022 1333   BUN 13 02/10/2022 1333   CREATININE 0.97 (H) 02/10/2022 1333   CALCIUM 9.3 02/10/2022 1333   GFRNONAA >60 01/19/2012 0949   GFRAA >60 01/19/2012 0949    Lipid Panel     Component Value Date/Time   CHOL 157 09/04/2021 1021   TRIG 101 09/04/2021 1021   HDL 53 09/04/2021 1021   CHOLHDL 3.0 09/04/2021 1021   LDLCALC 85 09/04/2021 1021    CBC    Component Value Date/Time   WBC 7.0 02/10/2022 1333   RBC 4.41 02/10/2022 1333   HGB 13.8 02/10/2022 1333   HCT 40.7 02/10/2022 1333   PLT 140 02/10/2022 1333   MCV 92.3 02/10/2022 1333   MCH 31.3 02/10/2022 1333   MCHC 33.9 02/10/2022 1333   RDW 12.4 02/10/2022 1333   LYMPHSABS 2,310 02/10/2022 1333   EOSABS 392 02/10/2022 1333   BASOSABS 91 02/10/2022 1333    Hgb A1C Lab Results  Component Value Date   HGBA1C 6.4 (A) 03/09/2022            Assessment & Plan:   Hypotension due to Medication Side Effect:  Reviewed request adverse effects with patient, 11% of people have hypotension She will plan to hold valsartan on the day of Reclast infusion and for 3 days afterwards. At this point, BP has gone back up we will have her restart Valsartan Reinforced DASH diet and exercise for weight loss We will continue to monitor  RTC in 2 months for your annual exam Webb Silversmith, NP

## 2022-07-17 NOTE — Addendum Note (Signed)
Addended by: Jearld Fenton on: 07/17/2022 01:25 PM   Modules accepted: Orders

## 2022-07-17 NOTE — Assessment & Plan Note (Signed)
We will send in Trelegy Continue albuterol as needed

## 2022-07-20 ENCOUNTER — Ambulatory Visit: Payer: Medicare Other | Admitting: Pharmacist

## 2022-07-20 DIAGNOSIS — J410 Simple chronic bronchitis: Secondary | ICD-10-CM

## 2022-07-20 DIAGNOSIS — I10 Essential (primary) hypertension: Secondary | ICD-10-CM

## 2022-07-20 NOTE — Chronic Care Management (AMB) (Signed)
07/20/2022 Name: Brooke King MRN: [id number] DOB: 07/22/1941  Chief Complaint  Patient presents with   Medication Assistance    LESLI ISSA is a 81 y.o. year old female who presented for a telephone visit. Speak with both patient and daughter Brooke King today, as requested by patient   They were referred to the pharmacist by their PCP for assistance in managing medication access.    Subjective:  Care Team: Primary Care Provider: Jearld Fenton, NP ; Next Scheduled Visit: 09/09/2022 Urology: Laneta Simmers; Next Scheduled Visit: 12/01/2022  Medication Access/Adherence  Current Pharmacy:  [facility], [place] [address] Phone: [phone] Fax: [phone]  [facility] ([place], [place] [facility] [address] [place] [address] [place] [address] Phone: [phone] Fax: [place], [place] [address] Phone: [phone] Fax: [phone]   Patient reports affordability concerns with their medications: Yes  - maintenance inhaler Patient reports access/transportation concerns to their pharmacy: No  Patient reports adherence concerns with their medications:  No    Reports her daughter Brooke King helps her with managing her medications/filling out paperwork  Reports cost of copay of patient's COPD maintenance inhaler (Trelegy/Breztri) is difficult to afford Today patient/daughter unsure of whether patient currently has Extra Help subsidy Note in 08/2021, patient advised Clinical Pharmacist that she had Partial Extra Help subsidy at that time  COPD:  Current medications:  - Trelegy - 1 puff daily - albuterol inhaler as needed for wheezing/shortness of breath  Medications tried in the past: Spiriva  Reports currently using Trelegy inhaler daily as directed and  consistently rinsing and spitting out after each use Denies having picked up Rx for [employer] inhaler. Patient/daughter verbalize understanding that Breztri inhaler is prescribed only as a possible alternative to [employer] (for possible cost savings/assistance program eligibility    Hypertension:  Current medications: valsartan 80 mg daily Medications previously tried:   Reports restarted taking valsartan as directed by PCP following office visit on 10/27  Patient has an automated, upper arm home BP cuff Reports has been monitoring home blood pressure, but denies recently recording readings/does not recall recent results  Patient denies recent hypotensive s/sx including dizziness, lightheadedness.   Denies interest in quitting smoking at this time  Objective:  Lab Results  Component Value Date   CREATININE 0.97 (H) 02/10/2022   BUN 13 02/10/2022   NA 142 02/10/2022   K 3.9 02/10/2022   CL 106 02/10/2022   CO2 27 02/10/2022    Lab Results  Component Value Date   CHOL 157 09/04/2021   HDL 53 09/04/2021   LDLCALC 85 09/04/2021   TRIG 101 09/04/2021   CHOLHDL 3.0 09/04/2021    Medications Reviewed Today     Reviewed by Rennis Petty, RPH-CPP (Pharmacist) on 07/20/22 at [phone]  Med List Status: <None>   Medication Order Taking? Sig Documenting Provider Last Dose Status Informant  acetaminophen (TYLENOL) 500 MG tablet [id number] Yes Take 500 mg by mouth every 6 (six) hours as needed. [provider] Taking Active   albuterol (VENTOLIN HFA) 108 (90 Base) MCG/ACT inhaler [id number] Yes USE 1 TO 2 INHALATIONS BY MOUTH  INTO THE LUNGS EVERY 6 HOURS AS  NEEDED FOR WHEEZING OR SHORTNESS OF BREATH Baity, Coralie Keens, NP Taking  Active   atorvastatin (LIPITOR) 10 MG tablet [id number] Yes TAKE 1 TABLET BY MOUTH  DAILY Baity, Coralie Keens, NP Taking Active   Budeson-Glycopyrrol-Formoterol (BREZTRI AEROSPHERE) 160-9-4.8 MCG/ACT Hollie Salk [id number]  Inhale 2 puffs into the lungs 2 (two) times  daily. Jearld Fenton, NP  Active   Calcium Carb-Cholecalciferol (CALCIUM + D3) 600-200 MG-UNIT TABS [id number] Yes Take by mouth. [provider] Taking Active   cetirizine (ZYRTEC) 10 MG tablet [id number] Yes Take 10 mg by mouth daily. [provider] Taking Active   Cranberry 180 MG CAPS [id number] No Take by mouth.  Patient not taking: Reported on 07/20/2022   [provider] Not Taking Active   gabapentin (NEURONTIN) 300 MG capsule [id number] Yes TAKE 1 CAPSULE BY MOUTH 3  TIMES DAILY  Patient taking differently: Take 300 mg by mouth 3 (three) times daily.   Jearld Fenton, NP Taking Active   hydrOXYzine (ATARAX/VISTARIL) 10 MG tablet [id number]  Take 1 tablet (10 mg total) by mouth daily as needed. Jearld Fenton, NP  Active   latanoprost (XALATAN) 0.005 % ophthalmic solution [id number] Yes Place 1 drop into both eyes at bedtime. [provider] Taking Active   montelukast (SINGULAIR) 10 MG tablet [id number] Yes Take 1 tablet (10 mg total) by mouth at bedtime. Jearld Fenton, NP Taking Active   omeprazole (PRILOSEC) 20 MG capsule [id number] Yes TAKE 1 CAPSULE BY MOUTH DAILY Jearld Fenton, NP Taking Active   SYNTHROID 112 MCG tablet [id number] Yes TAKE 1 TABLET BY MOUTH  DAILY BEFORE BREAKFAST Baity, Coralie Keens, NP Taking Active   traZODone (DESYREL) 50 MG tablet [id number]  Take 0.5 tablets (25 mg total) by mouth at bedtime as needed for sleep. Jearld Fenton, NP  Active   triamcinolone cream (KENALOG) 0.1 % [id number]  Apply 1 application. topically 2 (two) times daily. Jearld Fenton, NP  Active   valsartan (DIOVAN) 80 MG tablet [id number] Yes Take 1 tablet (80 mg total) by mouth daily. Jearld Fenton, NP Taking Active   vitamin B-12 (CYANOCOBALAMIN) 1000 MCG tablet [id number] Yes Take 1,000 mcg by mouth daily. [provider] Taking Active              Assessment/Plan:   Comprehensive medication review performed; medication list updated in  electronic medical record Caution patient for risk of dizziness/sedation with gabapentin, hydroxyzine and trazodone, particularly when taken together Denies dizziness/sleepiness with gabapentin Reports not using hydroxyzine Denies using trazodone recently. Reports previously used at bedtime only as needed Counsel patient on importance of consistently taking Synthroid each morning on an empty stomach, at least 30 minutes before food Counsel on importance of separating the doses of the thyroid product and the oral calcium supplement by at least 4 hours   COPD: - Reviewed appropriate inhaler use, including importance of rinsing out mouth after each use - Recommend patient/daughter to follow up with Social Security to determine if patient currently has Extra Help subsidy/if new application is needed - Will follow up by telephone in 2 days to continue medication assistance evaluation, once patient/daughter have determined patient's current Extra Help subsidy status  Hypertension: - Reviewed appropriate blood pressure monitoring technique and reviewed goal blood pressure. Recommended to check home blood pressure and heart rate, keep log of these results and have this record to review during future appointments - Recommend to contact PCP office sooner if needed for readings outside of established parameters or symptoms   Follow Up Plan: Clinic Pharmacist will outreach  to patient/daughter by telephone again on 07/22/2022 at 11:15 am   Wallace Cullens, PharmD, [facility] [place] [phone]

## 2022-07-20 NOTE — Patient Instructions (Signed)
Goals Addressed             This Visit's Progress    Pharmacy Goals       Check your blood pressure once daily, and any time you have concerning symptoms like headache, chest pain, dizziness, shortness of breath, or vision changes.   To appropriately check your blood pressure, make sure you do the following:  1) Avoid caffeine, exercise, or tobacco products for 30 minutes before checking. Empty your bladder. 2) Sit with your back supported in a flat-backed chair. Rest your arm on something flat (arm of the chair, table, etc). 3) Sit still with your feet flat on the floor, resting, for at least 5 minutes.  4) Check your blood pressure. Take 1-2 readings.  5) Write down these readings and bring with you to any provider appointments.  Bring your home blood pressure machine with you to a provider's office for accuracy comparison at least once a year.   Make sure you take your blood pressure medications before you come to any office visit, even if you were asked to fast for labs.   Feel free to call me with any questions or concerns.   Wallace Cullens, PharmD, [place] [phone]

## 2022-07-22 ENCOUNTER — Ambulatory Visit: Payer: Medicare Other | Admitting: Pharmacist

## 2022-07-22 DIAGNOSIS — I10 Essential (primary) hypertension: Secondary | ICD-10-CM

## 2022-07-22 DIAGNOSIS — J410 Simple chronic bronchitis: Secondary | ICD-10-CM

## 2022-07-22 NOTE — Chronic Care Management (AMB) (Signed)
07/22/2022 Name: Brooke King MRN: [id number] DOB: 01-05-1941  Chief Complaint  Patient presents with   Medication Assistance    Brooke King is a 81 y.o. year old female who presented for a telephone visit. Speak with both patient and daughter [name] today.   Patient was referred to the pharmacist by their PCP for assistance in managing medication access.   Subjective:  Care Team: Primary Care Provider: Jearld Fenton, NP ; Next Scheduled Visit: 09/09/2022 Urology: Laneta Simmers; Next Scheduled Visit: 12/01/2022  Medication Access/Adherence  Current Pharmacy:  [facility], [place] [address] Phone: [phone] Fax: [phone]  [facility] ([place], [place] [facility] [address] [place] [address] [place] [address] Phone: [phone] Fax: [place], [place] [address] [place] [address] Phone: [phone] Fax: [phone]   Patient reports affordability concerns with their medications: Yes  - maintenance inhaler Patient reports access/transportation concerns to their pharmacy: No  Patient reports adherence concerns with their medications:  No     Reports her daughter [name] helps her with managing her medications/filling out paperwork   Reports cost of copay of patient's COPD maintenance inhaler (Trelegy/Breztri) is difficult to afford Today daughter reports she completed application for Extra Help subsidy through Social Security website on 07/20/2022   COPD:   Current medications:  - Trelegy - 1 puff daily - albuterol inhaler as needed for wheezing/shortness of breath   Medications tried in the past: Spiriva   Reports currently using Trelegy inhaler daily as directed and consistently rinsing and spitting out after each use       Hypertension:   Current  medications: valsartan 80 mg daily Medications previously tried:    Reports restarted taking valsartan as directed by PCP following office visit on 10/27   Patient has an automated, upper arm home BP cuff Reports checked home blood pressure this morning, reading: 115/69, HR 76   Patient denies recent hypotensive s/sx including dizziness, lightheadedness.   Reports planning to obtain new home BP monitor as believes current monitor ~81 years old  Tobacco Use:  Reports currently smoking ~1 pack/day Triggers: sitting around/habit Denies interest in quitting smoking at this time    Objective:  Lab Results  Component Value Date   CREATININE 0.97 (H) 02/10/2022   BUN 13 02/10/2022   NA 142 02/10/2022   K 3.9 02/10/2022   CL 106 02/10/2022   CO2 27 02/10/2022    Lab Results  Component Value Date   CHOL 157 09/04/2021   HDL 53 09/04/2021   LDLCALC 85 09/04/2021   TRIG 101 09/04/2021   CHOLHDL 3.0 09/04/2021   BP Readings from Last 3 Encounters:  07/17/22 (!) 142/84  06/30/22 138/84  06/02/22 130/69   Pulse Readings from Last 3 Encounters:  07/17/22 77  06/30/22 75  06/02/22 80     Medications Reviewed Today     Reviewed by Rennis Petty, RPH-CPP (Pharmacist) on 07/20/22 at [phone]  Med List Status: <None>   Medication Order Taking? Sig Documenting Provider Last Dose Status Informant  acetaminophen (TYLENOL) 500 MG tablet [id number] Yes Take 500 mg by mouth every 6 (six) hours as needed. [provider] Taking Active   albuterol (VENTOLIN HFA) 108 (90 Base) MCG/ACT inhaler [id number] Yes USE  1 TO 2 INHALATIONS BY MOUTH  INTO THE LUNGS EVERY 6 HOURS AS  NEEDED FOR WHEEZING OR SHORTNESS OF BREATH Baity, Coralie Keens, NP Taking Active   atorvastatin (LIPITOR) 10 MG tablet [id number] Yes TAKE 1 TABLET BY MOUTH  DAILY Baity, Coralie Keens, NP Taking Active   Budeson-Glycopyrrol-Formoterol (BREZTRI AEROSPHERE) 160-9-4.8 MCG/ACT Hollie Salk [id number]  Inhale 2 puffs into the lungs  2 (two) times daily. Jearld Fenton, NP  Active   Calcium Carb-Cholecalciferol (CALCIUM + D3) 600-200 MG-UNIT TABS [id number] Yes Take by mouth. [provider] Taking Active   cetirizine (ZYRTEC) 10 MG tablet [id number] Yes Take 10 mg by mouth daily. [provider] Taking Active   Cranberry 180 MG CAPS [id number] No Take by mouth.  Patient not taking: Reported on 07/20/2022   [provider] Not Taking Active   gabapentin (NEURONTIN) 300 MG capsule [id number] Yes TAKE 1 CAPSULE BY MOUTH 3  TIMES DAILY  Patient taking differently: Take 300 mg by mouth 3 (three) times daily.   Jearld Fenton, NP Taking Active   hydrOXYzine (ATARAX/VISTARIL) 10 MG tablet [id number]  Take 1 tablet (10 mg total) by mouth daily as needed. Jearld Fenton, NP  Active   latanoprost (XALATAN) 0.005 % ophthalmic solution [id number] Yes Place 1 drop into both eyes at bedtime. [provider] Taking Active   montelukast (SINGULAIR) 10 MG tablet [id number] Yes Take 1 tablet (10 mg total) by mouth at bedtime. Jearld Fenton, NP Taking Active   omeprazole (PRILOSEC) 20 MG capsule [id number] Yes TAKE 1 CAPSULE BY MOUTH DAILY Jearld Fenton, NP Taking Active   SYNTHROID 112 MCG tablet [id number] Yes TAKE 1 TABLET BY MOUTH  DAILY BEFORE BREAKFAST Baity, Coralie Keens, NP Taking Active   traZODone (DESYREL) 50 MG tablet [id number]  Take 0.5 tablets (25 mg total) by mouth at bedtime as needed for sleep. Jearld Fenton, NP  Active   triamcinolone cream (KENALOG) 0.1 % [id number]  Apply 1 application. topically 2 (two) times daily. Jearld Fenton, NP  Active   valsartan (DIOVAN) 80 MG tablet [id number] Yes Take 1 tablet (80 mg total) by mouth daily. Jearld Fenton, NP Taking Active   vitamin B-12 (CYANOCOBALAMIN) 1000 MCG tablet [id number] Yes Take 1,000 mcg by mouth daily. [provider] Taking Active               Assessment/Plan:   COPD: - Reviewed appropriate inhaler use, including  importance of rinsing out mouth after each use - Recommend patient/daughter watch for response back from Social Security via mail within the next 4-6 weeks. Advise patient/daughter to retain a copy of this letter and to contact Clinical Pharmacist when letter received.   Hypertension: - Reviewed appropriate blood pressure monitoring technique and reviewed goal blood pressure. Recommended to check home blood pressure and heart rate, keep log of these results and have this record to review during future appointments - Recommend to contact PCP office sooner if needed for readings outside of established parameters or symptoms   Tobacco Abuse - Counsel on benefits of tobacco cessation - Provided information on 1 800 QUIT NOW support program - Patient denies interest in quitting at this time, but encouraged to follow up with provider  Follow Up Plan: Clinical Pharmacist will follow up with patient/daughter by telephone on 09/07/2022 at 11:30 am  Wallace Cullens, PharmD, [place] [phone]

## 2022-07-22 NOTE — Patient Instructions (Signed)
Goals Addressed             This Visit's Progress    Pharmacy Goals       Check your blood pressure once daily, and any time you have concerning symptoms like headache, chest pain, dizziness, shortness of breath, or vision changes.   To appropriately check your blood pressure, make sure you do the following:  1) Avoid caffeine, exercise, or tobacco products for 30 minutes before checking. Empty your bladder. 2) Sit with your back supported in a flat-backed chair. Rest your arm on something flat (arm of the chair, table, etc). 3) Sit still with your feet flat on the floor, resting, for at least 5 minutes.  4) Check your blood pressure. Take 1-2 readings.  5) Write down these readings and bring with you to any provider appointments.  Bring your home blood pressure machine with you to a provider's office for accuracy comparison at least once a year.   Make sure you take your blood pressure medications before you come to any office visit, even if you were asked to fast for labs.  Feel free to call me with any questions or concerns.   Wallace Cullens, PharmD, [place] [phone]

## 2022-07-24 ENCOUNTER — Other Ambulatory Visit: Payer: Self-pay | Admitting: Internal Medicine

## 2022-07-24 DIAGNOSIS — E785 Hyperlipidemia, unspecified: Secondary | ICD-10-CM

## 2022-07-24 DIAGNOSIS — G4733 Obstructive sleep apnea (adult) (pediatric): Secondary | ICD-10-CM | POA: Diagnosis not present

## 2022-07-27 NOTE — Telephone Encounter (Signed)
Refilled 03/09/2022 #90 1 rf. Requested Prescriptions  Pending Prescriptions Disp Refills   atorvastatin (LIPITOR) 10 MG tablet [Pharmacy Med Name: Atorvastatin Calcium 10 MG Oral Tablet] 90 tablet 3    Sig: TAKE 1 TABLET BY MOUTH DAILY     Cardiovascular:  Antilipid - Statins Failed - 07/24/2022 10:58 PM      Failed - Lipid Panel in normal range within the last 12 months    Cholesterol  Date Value Ref Range Status  09/04/2021 157 <200 mg/dL Final   LDL Cholesterol (Calc)  Date Value Ref Range Status  09/04/2021 85 mg/dL (calc) Final    Comment:    Reference range: <100 . Desirable range <100 mg/dL for primary prevention;   <70 mg/dL for patients with CHD or diabetic patients  with > or = 2 CHD risk factors. Marland Kitchen LDL-C is now calculated using the Martin-Hopkins  calculation, which is a validated novel method providing  better accuracy than the Friedewald equation in the  estimation of LDL-C.  Cresenciano Genre et al. Annamaria Helling. 0177;939(03): 2061-2068  (http://education.[employer].com/faq/FAQ164)    HDL  Date Value Ref Range Status  09/04/2021 53 > OR = 50 mg/dL Final   Triglycerides  Date Value Ref Range Status  09/04/2021 101 <150 mg/dL Final         Passed - Patient is not pregnant      Passed - Valid encounter within last 12 months    Recent Outpatient Visits           5 days ago Simple chronic bronchitis ([place])   [place], RPH-CPP   1 week ago Simple chronic bronchitis ([place])   [place], RPH-CPP   1 week ago Benign essential hypertension   [place], Coralie Keens, NP   3 weeks ago COPD exacerbation [facility])   [facility] [place], Coralie Keens, NP   3 months ago Acute bronchitis with COPD [facility])   [facility] Olin Hauser, DO       Future Appointments             In 1 month Baity, Coralie Keens, NP [facility], [place]   In 4  months [place], Gordan Payment [place]

## 2022-08-02 ENCOUNTER — Other Ambulatory Visit: Payer: Self-pay | Admitting: Internal Medicine

## 2022-08-03 NOTE — Telephone Encounter (Signed)
Requested Prescriptions  Pending Prescriptions Disp Refills   valsartan (DIOVAN) 80 MG tablet [Pharmacy Med Name: Valsartan 80 MG Oral Tablet] 90 tablet 1    Sig: TAKE 1 TABLET BY MOUTH DAILY     Cardiovascular:  Angiotensin Receptor Blockers Failed - 08/02/2022  6:01 AM      Failed - Cr in normal range and within 180 days    Creat  Date Value Ref Range Status  02/10/2022 0.97 (H) 0.60 - 0.95 mg/dL Final   Creatinine, Urine  Date Value Ref Range Status  09/04/2021 18 (L) 20 - 275 mg/dL Final         Failed - Last BP in normal range    BP Readings from Last 1 Encounters:  07/17/22 (!) 142/84         Passed - K in normal range and within 180 days    Potassium  Date Value Ref Range Status  02/10/2022 3.9 3.5 - 5.3 mmol/L Final  12/15/2013 3.8 3.5 - 5.1 mmol/L Final         Passed - Patient is not pregnant      Passed - Valid encounter within last 6 months    Recent Outpatient Visits           1 week ago Simple chronic bronchitis ([place])   [facility], [place] Ormond A, RPH-CPP   2 weeks ago Simple chronic bronchitis ([place])   [place], Grayland Ormond A, RPH-CPP   2 weeks ago Benign essential hypertension   [place], Coralie Keens, NP   1 month ago COPD exacerbation [facility])   [facility] [place], Coralie Keens, NP   4 months ago Acute bronchitis with COPD [facility])   [facility] Olin Hauser, DO       Future Appointments             In 1 month Baity, Coralie Keens, NP [facility], [place]   In 4 months [place]owan, Gordan Payment [place]

## 2022-08-08 ENCOUNTER — Other Ambulatory Visit: Payer: Self-pay | Admitting: Internal Medicine

## 2022-08-10 NOTE — Telephone Encounter (Signed)
Requested Prescriptions  Pending Prescriptions Disp Refills   montelukast (SINGULAIR) 10 MG tablet [Pharmacy Med Name: Montelukast Sodium 10 MG Oral Tablet] 90 tablet 1    Sig: TAKE 1 TABLET BY MOUTH AT  BEDTIME     Pulmonology:  Leukotriene Inhibitors Passed - 08/08/2022 10:21 PM      Passed - Valid encounter within last 12 months    Recent Outpatient Visits           2 weeks ago Simple chronic bronchitis ([place])   [facility], [place] Ormond A, RPH-CPP   3 weeks ago Simple chronic bronchitis ([place])   [facility] [place], Gray[place] Ormond A, RPH-CPP   3 weeks ago Benign essential hypertension   [place], Coralie Keens, NP   1 month ago COPD exacerbation [facility])   [facility] [place], Coralie Keens, NP   4 months ago Acute bronchitis with COPD [facility])   [facility] Olin Hauser, DO       Future Appointments             In 1 month Baity, Coralie Keens, NP [place] [facility], [place]   In 3 months McGowan, Gordan Payment [place]

## 2022-09-07 ENCOUNTER — Ambulatory Visit: Payer: Medicare Other | Admitting: Pharmacist

## 2022-09-07 ENCOUNTER — Telehealth: Payer: Medicare Other

## 2022-09-07 DIAGNOSIS — J410 Simple chronic bronchitis: Secondary | ICD-10-CM

## 2022-09-07 DIAGNOSIS — I10 Essential (primary) hypertension: Secondary | ICD-10-CM

## 2022-09-07 NOTE — Chronic Care Management (AMB) (Signed)
  Chronic Care Management   Note  09/07/2022 Name: Brooke King MRN: [id number] DOB: 1941-02-15  Receive a follow up call from patient advising that she just received a new letter in the mail today for Social Security stating that starting 09/21/2022 she will start to have Full Extra Help subsidy. Counsel patient on difference between Partial and Full Extra Help subsidy. Patient denies further medication questions/concerns today, but confirms has phone number for Clinic Pharmacist saved and will call if she has further medication cost concerns.  Will provide update to PCP   Follow up plan: Patient denies further medication questions or concerns today Provide patient with contact information for clinic pharmacist to contact if needed in future for medication questions/concerns   Wallace Cullens, PharmD, [place] [phone]

## 2022-09-07 NOTE — Patient Instructions (Signed)
Goals Addressed             This Visit's Progress    Pharmacy Goals       Check your blood pressure once daily, and any time you have concerning symptoms like headache, chest pain, dizziness, shortness of breath, or vision changes.   To appropriately check your blood pressure, make sure you do the following:  1) Avoid caffeine, exercise, or tobacco products for 30 minutes before checking. Empty your bladder. 2) Sit with your back supported in a flat-backed chair. Rest your arm on something flat (arm of the chair, table, etc). 3) Sit still with your feet flat on the floor, resting, for at least 5 minutes.  4) Check your blood pressure. Take 1-2 readings.  5) Write down these readings and bring with you to any provider appointments.  Bring your home blood pressure machine with you to a provider's office for accuracy comparison at least once a year.   Make sure you take your blood pressure medications before you come to any office visit, even if you were asked to fast for labs.  Feel free to call me with any questions or concerns.    Wallace Cullens, PharmD, [place] [phone]

## 2022-09-07 NOTE — Chronic Care Management (AMB) (Signed)
09/07/2022 Name: Brooke King MRN: [id number] DOB: 02/22/1941  Chief Complaint  Patient presents with   Medication Assistance    Brooke King is a 81 y.o. year old female who presented for a telephone visit.   They were referred to the pharmacist by their PCP for assistance in managing medication access.    Subjective:  Care Team: Primary Care Provider: Jearld Fenton, NP ; Next Scheduled Visit: 09/09/2022 Urology: Laneta Simmers; Next Scheduled Visit: 12/01/2022  Medication Access/Adherence  Current Pharmacy:  [facility] [place], [place] [address] Phone: [phone] Fax: [phone]  [facility] ([place], [place] [address] [place] [address] Phone: [phone] Fax: [place], [place] [address] Phone: [phone] Fax: [phone]   Patient reports affordability concerns with their medications: Yes  Patient reports access/transportation concerns to their pharmacy: No  Patient reports adherence concerns with their medications:  No     Reports her daughter Brooke King helps her with managing her medications/filling out paperwork   Reports received response from Social Security regarding her application for Extra Help subsidy and she is Approved for Partial Extra Help subsidy - Note patient previously enrolled in partial extra help subsidy as well   COPD:   Current medications:  - Trelegy - 1 puff daily - albuterol inhaler as needed for wheezing/shortness of breath   Medications tried in the past: Spiriva   Reports consistently rinsing and spitting out after each use of Trelegy, but denies using inhaler every day      Hypertension:   Current medications: valsartan 80 mg daily   Patient has an automated, upper arm home BP  cuff Reports checked home blood pressure last night, reading: 130/70, HR 68   Patient denies recent hypotension   Reports obtained new upper arm blood pressure monitor    Objective:  Lab Results  Component Value Date   CREATININE 0.97 (H) 02/10/2022   BUN 13 02/10/2022   NA 142 02/10/2022   K 3.9 02/10/2022   CL 106 02/10/2022   CO2 27 02/10/2022    Lab Results  Component Value Date   CHOL 157 09/04/2021   HDL 53 09/04/2021   LDLCALC 85 09/04/2021   TRIG 101 09/04/2021   CHOLHDL 3.0 09/04/2021   BP Readings from Last 3 Encounters:  07/17/22 (!) 142/84  06/30/22 138/84  06/02/22 130/69   Pulse Readings from Last 3 Encounters:  07/17/22 77  06/30/22 75  06/02/22 80     Medications Reviewed Today     Reviewed by Rennis Petty, RPH-CPP (Pharmacist) on 07/20/22 at [phone]  Med List Status: <None>   Medication Order Taking? Sig Documenting Provider Last Dose Status Informant  acetaminophen (TYLENOL) 500 MG tablet [id number] Yes Take 500 mg by mouth every 6 (six) hours as needed. [provider] Taking Active   albuterol (VENTOLIN HFA) 108 (90 Base) MCG/ACT inhaler [id number] Yes USE 1 TO 2 INHALATIONS BY MOUTH  INTO THE LUNGS EVERY 6 HOURS AS  NEEDED FOR WHEEZING OR SHORTNESS OF BREATH Baity, Coralie Keens, NP Taking Active   atorvastatin (LIPITOR) 10 MG tablet [id number] Yes TAKE 1 TABLET BY MOUTH  DAILY Baity, Coralie Keens, NP Taking Active   Budeson-Glycopyrrol-Formoterol (BREZTRI AEROSPHERE) 160-9-4.8 MCG/ACT  AERO [id number]  Inhale 2 puffs into the lungs 2 (two) times daily. Jearld Fenton, NP  Active   Calcium Carb-Cholecalciferol (CALCIUM + D3) 600-200 MG-UNIT TABS [id number] Yes Take by mouth. [provider] Taking Active   cetirizine (ZYRTEC) 10 MG tablet [id number] Yes Take 10 mg by mouth daily. [provider] Taking Active   Cranberry 180 MG CAPS [id number] No Take by mouth.  Patient not taking: Reported on 07/20/2022   [provider] Not Taking Active   gabapentin (NEURONTIN) 300 MG capsule [id number] Yes TAKE 1 CAPSULE BY MOUTH 3  TIMES DAILY  Patient taking differently: Take 300 mg by mouth 3 (three) times daily.   Jearld Fenton, NP Taking Active   hydrOXYzine (ATARAX/VISTARIL) 10 MG tablet [id number]  Take 1 tablet (10 mg total) by mouth daily as needed. Jearld Fenton, NP  Active   latanoprost (XALATAN) 0.005 % ophthalmic solution [id number] Yes Place 1 drop into both eyes at bedtime. [provider] Taking Active   montelukast (SINGULAIR) 10 MG tablet [id number] Yes Take 1 tablet (10 mg total) by mouth at bedtime. Jearld Fenton, NP Taking Active   omeprazole (PRILOSEC) 20 MG capsule [id number] Yes TAKE 1 CAPSULE BY MOUTH DAILY Jearld Fenton, NP Taking Active   SYNTHROID 112 MCG tablet [id number] Yes TAKE 1 TABLET BY MOUTH  DAILY BEFORE BREAKFAST Baity, Coralie Keens, NP Taking Active   traZODone (DESYREL) 50 MG tablet [id number]  Take 0.5 tablets (25 mg total) by mouth at bedtime as needed for sleep. Jearld Fenton, NP  Active   triamcinolone cream (KENALOG) 0.1 % [id number]  Apply 1 application. topically 2 (two) times daily. Jearld Fenton, NP  Active   valsartan (DIOVAN) 80 MG tablet [id number] Yes Take 1 tablet (80 mg total) by mouth daily. Jearld Fenton, NP Taking Active   vitamin B-12 (CYANOCOBALAMIN) 1000 MCG tablet [id number] Yes Take 1,000 mcg by mouth daily. [provider] Taking Active               Assessment/Plan:   COPD/Medication Assistance: - Reviewed appropriate inhaler use, including importance of rinsing out mouth after each use and using inhaler every day - Remind patient to retain her Partial Extra Help subsidy letter from [facility] patient on Medicare Part D coverage, including how coverage gap will end/restart of benefit with new calendar year - Counsel patient on eligibility criteria for Trelegy patient assistance program through [employer], including $600 annual  out of pocket expenditure requirement - Will collaborate with PCP   Hypertension: - Reviewed appropriate blood pressure monitoring technique and reviewed goal blood pressure. Recommended to check home blood pressure and heart rate, keep log of these results and have this record to review during future appointments - Recommend to contact PCP office sooner if needed for readings outside of established parameters or symptoms    Tobacco Abuse - Have counseled on benefits of tobacco cessation - Provided information on 1 800 QUIT NOW support program   Follow Up Plan:  Patient denies further medication questions or concerns today Provide patient with contact information for clinic pharmacist to contact if needed in future for medication questions/concerns. Patient to contact pharmacist by telephone when ready to apply for Trelegy patient assistance when meets/close to meeting annual out of pocket expenditure requirement    Wallace Cullens, PharmD, [place] [phone]

## 2022-09-09 ENCOUNTER — Ambulatory Visit (INDEPENDENT_AMBULATORY_CARE_PROVIDER_SITE_OTHER): Payer: Medicare Other | Admitting: Internal Medicine

## 2022-09-09 ENCOUNTER — Encounter: Payer: Self-pay | Admitting: Internal Medicine

## 2022-09-09 VITALS — BP 138/78 | HR 98 | Temp 96.6°F | Ht 63.5 in | Wt 174.0 lb

## 2022-09-09 DIAGNOSIS — E039 Hypothyroidism, unspecified: Secondary | ICD-10-CM

## 2022-09-09 DIAGNOSIS — E1142 Type 2 diabetes mellitus with diabetic polyneuropathy: Secondary | ICD-10-CM | POA: Diagnosis not present

## 2022-09-09 DIAGNOSIS — Z0001 Encounter for general adult medical examination with abnormal findings: Secondary | ICD-10-CM | POA: Diagnosis not present

## 2022-09-09 DIAGNOSIS — Z6830 Body mass index (BMI) 30.0-30.9, adult: Secondary | ICD-10-CM

## 2022-09-09 DIAGNOSIS — E6609 Other obesity due to excess calories: Secondary | ICD-10-CM

## 2022-09-09 MED ORDER — TRELEGY ELLIPTA 100-62.5-25 MCG/ACT IN AEPB: 1.0000 | INHALATION_SPRAY | Freq: Every day | RESPIRATORY_TRACT | 5 refills | Status: DC

## 2022-09-09 MED ORDER — AZITHROMYCIN 250 MG PO TABS: ORAL_TABLET | ORAL | 0 refills | Status: DC

## 2022-09-09 NOTE — Assessment & Plan Note (Signed)
Encouraged diet and exercise for weight loss ?

## 2022-09-09 NOTE — Progress Notes (Signed)
Subjective:    Patient ID: Brooke King, female    DOB: Mar 02, 1941, 81 y.o.   MRN: [id number]  HPI  Patient presents to clinic today for her annual exam.  Flu: 05/2021 Tetanus: >10 years ago COVID: Never Pneumovax: 08/2021 Prevnar: 11/2015 Shingrix: Never Pap smear: Hysterectomy Mammogram: 04/2022 Bone density: 04/2022 Colon screening: 10/2015 Vision screening: annually Dentist: as needed  Diet: She does not eat much meat. She consumes some veggies, not many fruits. She tries to avoid fried foods. She drinks mostly water, coffee, soda. Exercise: None  Review of Systems     Past Medical History:  Diagnosis Date   Allergy    B12 deficiency    Chest pain syndrome    COPD (chronic obstructive pulmonary disease) (HCC)    Depression    GERD (gastroesophageal reflux disease)    Glaucoma    History of colonic polyps    History of degenerative disc disease    c-spine, status post cervical laminectomy   Hyperlipidemia    Hypertension    Hypothyroidism    Insomnia    Migraine    Restless leg syndrome    Seasonal allergies    Sleep apnea    Thyroid cancer (HCC)    Urinary incontinence     Current Outpatient Medications  Medication Sig Dispense Refill   acetaminophen (TYLENOL) 500 MG tablet Take 500 mg by mouth every 6 (six) hours as needed.     albuterol (VENTOLIN HFA) 108 (90 Base) MCG/ACT inhaler USE 1 TO 2 INHALATIONS BY MOUTH  INTO THE LUNGS EVERY 6 HOURS AS  NEEDED FOR WHEEZING OR SHORTNESS OF BREATH 34 g 3   atorvastatin (LIPITOR) 10 MG tablet TAKE 1 TABLET BY MOUTH  DAILY 90 tablet 1   Calcium Carb-Cholecalciferol (CALCIUM + D3) 600-200 MG-UNIT TABS Take by mouth.     cetirizine (ZYRTEC) 10 MG tablet Take 10 mg by mouth daily.     Cranberry 180 MG CAPS Take by mouth. (Patient not taking: Reported on 07/20/2022)     gabapentin (NEURONTIN) 300 MG capsule TAKE 1 CAPSULE BY MOUTH 3  TIMES DAILY (Patient taking differently: Take 300 mg by mouth 3 (three) times daily.) 270  capsule 3   hydrOXYzine (ATARAX/VISTARIL) 10 MG tablet Take 1 tablet (10 mg total) by mouth daily as needed. 30 tablet 0   latanoprost (XALATAN) 0.005 % ophthalmic solution Place 1 drop into both eyes at bedtime.     montelukast (SINGULAIR) 10 MG tablet TAKE 1 TABLET BY MOUTH AT  BEDTIME 90 tablet 1   omeprazole (PRILOSEC) 20 MG capsule TAKE 1 CAPSULE BY MOUTH DAILY 90 capsule 3   SYNTHROID 112 MCG tablet TAKE 1 TABLET BY MOUTH  DAILY BEFORE BREAKFAST 90 tablet 2   traZODone (DESYREL) 50 MG tablet Take 0.5 tablets (25 mg total) by mouth at bedtime as needed for sleep. 45 tablet 0   TRELEGY ELLIPTA 100-62.5-25 MCG/ACT AEPB Inhale 1 puff into the lungs daily.     triamcinolone cream (KENALOG) 0.1 % Apply 1 application. topically 2 (two) times daily. 30 g 0   valsartan (DIOVAN) 80 MG tablet TAKE 1 TABLET BY MOUTH DAILY 90 tablet 1   vitamin B-12 (CYANOCOBALAMIN) 1000 MCG tablet Take 1,000 mcg by mouth daily.     No current facility-administered medications for this visit.    Allergies  Allergen Reactions   Toviaz [Fesoterodine Fumarate Er] Other (See Comments)    Throat closed   Cortisone    Doxycycline Hyclate  Hydrocodone    Keflex [Cephalexin]    Septra [Sulfamethoxazole-Trimethoprim]    Tagamet [Cimetidine]    Zoloft [Sertraline]    Penicillins Rash    Family History  Problem Relation Age of Onset   Heart disease Mother    Breast cancer Neg Hx     Social History   Socioeconomic History   Marital status: Widowed    Spouse name: Not on file   Number of children: Not on file   Years of education: Not on file   Highest education level: Not on file  Occupational History   Occupation: Retired  Tobacco Use   Smoking status: Every Day    Packs/day: 1.00    Years: 40.00    Total pack years: 40.00    Types: Cigarettes   Smokeless tobacco: Never  Vaping Use   Vaping Use: Never used  Substance and Sexual Activity   Alcohol use: No   Drug use: No   Sexual activity: Not  on file  Other Topics Concern   Not on file  Social History Narrative   Not on file   Social Determinants of Health   Financial Resource Strain: Low Risk  (03/09/2022)   Overall Financial Resource Strain (CARDIA)    Difficulty of Paying Living Expenses: Not hard at all  Food Insecurity: No Food Insecurity (03/09/2022)   Hunger Vital Sign    Worried About Running Out of Food in the Last Year: Never true    [place] in the Last Year: Never true  Transportation Needs: No Transportation Needs (03/09/2022)   PRAPARE - Hydrologist (Medical): No    Lack of Transportation (Non-Medical): No  Physical Activity: Sufficiently Active (03/09/2022)   Exercise Vital Sign    Days of Exercise per Week: 7 days    Minutes of Exercise per Session: 30 min  Stress: Stress Concern Present (03/09/2022)   [place]    Feeling of Stress : To some extent  Social Connections: Not on file  Intimate Partner Violence: Not At Risk (03/09/2022)   Humiliation, Afraid, Rape, and Kick questionnaire    Fear of Current or Ex-Partner: No    Emotionally Abused: No    Physically Abused: No    Sexually Abused: No     Constitutional: Denies fever, malaise, fatigue, headache or abrupt weight changes.  HEENT: Denies eye pain, eye redness, ear pain, ringing in the ears, wax buildup, runny nose, nasal congestion, bloody nose, or sore throat. Respiratory: Pt reports cough. Denies difficulty breathing, shortness of breath.   Cardiovascular: Denies chest pain, chest tightness, palpitations or swelling in the hands or feet.  Gastrointestinal: Denies abdominal pain, bloating, constipation, diarrhea or blood in the stool.  GU: Patient reports incontinence.  Denies urgency, frequency, pain with urination, burning sensation, blood in urine, odor or discharge. Musculoskeletal: Denies decrease in range of motion, difficulty with gait,  muscle pain or joint pain and swelling.  Skin: Denies redness, rashes, lesions or ulcercations.  Neurological: Denies dizziness, difficulty with memory, difficulty with speech or problems with balance and coordination.  Psych: Patient has a history of anxiety.  Denies depression, SI/HI.  No other specific complaints in a complete review of systems (except as listed in HPI above).  Objective:   Physical Exam   BP 138/78   Pulse 98   Temp (!) 96.6 F (35.9 C) (Temporal)   Ht 5' 3.5" (1.613 m)   Wt 174  lb (78.9 kg)   SpO2 (!) 79%   BMI 30.34 kg/m   Wt Readings from Last 3 Encounters:  07/17/22 177 lb (80.3 kg)  06/30/22 177 lb (80.3 kg)  06/02/22 178 lb (80.7 kg)    General: Appears her stated age, obese, in NAD. Skin: Warm, dry and intact. No ulcerations noted. HEENT: Head: normal shape and size; Eyes: sclera [place], no icterus, conjunctiva pink, PERRLA and EOMs intact;  Neck:  Neck supple, trachea midline. No masses, lumps or thyromegaly present.  Cardiovascular: Normal rate and rhythm. S1,S2 noted.  No murmur, rubs or gallops noted. No JVD or BLE edema. No carotid bruits noted. Pulmonary/Chest: Normal effort and positive vesicular breath sounds with rhonchi in the left lower lobe. No respiratory distress. No wheezes, rales noted.  Abdomen: Normal bowel sounds.  Musculoskeletal: Strength 5/5 BUE/BLE.  No difficulty with gait.  Neurological: Alert and oriented. Cranial nerves II-XII grossly intact. Coordination normal.  Psychiatric: Mood and affect normal. Behavior is normal. Judgment and thought content normal.    BMET    Component Value Date/Time   NA 142 02/10/2022 1333   K 3.9 02/10/2022 1333   K 3.8 12/15/2013 1202   CL 106 02/10/2022 1333   CO2 27 02/10/2022 1333   GLUCOSE 92 02/10/2022 1333   BUN 13 02/10/2022 1333   CREATININE 0.97 (H) 02/10/2022 1333   CALCIUM 9.3 02/10/2022 1333   GFRNONAA >60 01/19/2012 0949   GFRAA >60 01/19/2012 0949    Lipid Panel      Component Value Date/Time   CHOL 157 09/04/2021 1021   TRIG 101 09/04/2021 1021   HDL 53 09/04/2021 1021   CHOLHDL 3.0 09/04/2021 1021   LDLCALC 85 09/04/2021 1021    CBC    Component Value Date/Time   WBC 7.0 02/10/2022 1333   RBC 4.41 02/10/2022 1333   HGB 13.8 02/10/2022 1333   HCT 40.7 02/10/2022 1333   PLT 140 02/10/2022 1333   MCV 92.3 02/10/2022 1333   MCH 31.3 02/10/2022 1333   MCHC 33.9 02/10/2022 1333   RDW 12.4 02/10/2022 1333   LYMPHSABS 2,310 02/10/2022 1333   EOSABS 392 02/10/2022 1333   BASOSABS 91 02/10/2022 1333    Hgb A1C Lab Results  Component Value Date   HGBA1C 6.4 (A) 03/09/2022           Assessment & Plan:   Preventative Health Maintenance:  Flu shot UTD She declines tetanus for financial reasons, advised if she gets bit or cut to get this done Pneumovax and Prevnar UTD Encouraged her to get her COVID-vaccine Discussed Shingrix vaccine, she will check coverage with her insurance company and schedule a visit at the pharmacy if she would like to have this done She no longer needs Pap smears Mammogram UTD Bone density UTD Colon screening UTD Encouraged her to consume a balanced diet and exercise regimen Advised her seeing eye doctor and dentist annually We will check CBC, c-Met, TSH, free T4, lipid, A1c and urine microalbumin today  Pneumonia:  Rx for azithromycin 250 mg daily x 5 days  RTC in 6 months, follow-up chronic conditions Webb Silversmith, NP

## 2022-09-09 NOTE — Patient Instructions (Signed)
Health Maintenance for Postmenopausal Women Menopause is a normal process in which your ability to get pregnant comes to an end. This process happens slowly over many months or years, usually between the ages of 48 and 55. Menopause is complete when you have missed your menstrual period for 12 months. It is important to talk with your health care provider about some of the most common conditions that affect women after menopause (postmenopausal women). These include heart disease, cancer, and bone loss (osteoporosis). Adopting a healthy lifestyle and getting preventive care can help to promote your health and wellness. The actions you take can also lower your chances of developing some of these common conditions. What are the signs and symptoms of menopause? During menopause, you may have the following symptoms: Hot flashes. These can be moderate or severe. Night sweats. Decrease in sex drive. Mood swings. Headaches. Tiredness (fatigue). Irritability. Memory problems. Problems falling asleep or staying asleep. Talk with your health care provider about treatment options for your symptoms. Do I need hormone replacement therapy? Hormone replacement therapy is effective in treating symptoms that are caused by menopause, such as hot flashes and night sweats. Hormone replacement carries certain risks, especially as you become older. If you are thinking about using estrogen or estrogen with progestin, discuss the benefits and risks with your health care provider. How can I reduce my risk for heart disease and stroke? The risk of heart disease, heart attack, and stroke increases as you age. One of the causes may be a change in the body's hormones during menopause. This can affect how your body uses dietary fats, triglycerides, and cholesterol. Heart attack and stroke are medical emergencies. There are many things that you can do to help prevent heart disease and stroke. Watch your blood pressure High  blood pressure causes heart disease and increases the risk of stroke. This is more likely to develop in people who have high blood pressure readings or are overweight. Have your blood pressure checked: Every 3-5 years if you are 18-39 years of age. Every year if you are 40 years old or older. Eat a healthy diet  Eat a diet that includes plenty of vegetables, fruits, low-fat dairy products, and lean protein. Do not eat a lot of foods that are high in solid fats, added sugars, or sodium. Get regular exercise Get regular exercise. This is one of the most important things you can do for your health. Most adults should: Try to exercise for at least 150 minutes each week. The exercise should increase your heart rate and make you sweat (moderate-intensity exercise). Try to do strengthening exercises at least twice each week. Do these in addition to the moderate-intensity exercise. Spend less time sitting. Even light physical activity can be beneficial. Other tips Work with your health care provider to achieve or maintain a healthy weight. Do not use any products that contain nicotine or tobacco. These products include cigarettes, chewing tobacco, and vaping devices, such as e-cigarettes. If you need help quitting, ask your health care provider. Know your numbers. Ask your health care provider to check your cholesterol and your blood sugar (glucose). Continue to have your blood tested as directed by your health care provider. Do I need screening for cancer? Depending on your health history and family history, you may need to have cancer screenings at different stages of your life. This may include screening for: Breast cancer. Cervical cancer. Lung cancer. Colorectal cancer. What is my risk for osteoporosis? After menopause, you may be   at increased risk for osteoporosis. Osteoporosis is a condition in which bone destruction happens more quickly than new bone creation. To help prevent osteoporosis or  the bone fractures that can happen because of osteoporosis, you may take the following actions: If you are 19-50 years old, get at least 1,000 mg of calcium and at least 600 international units (IU) of vitamin D per day. If you are older than age 50 but younger than age 70, get at least 1,200 mg of calcium and at least 600 international units (IU) of vitamin D per day. If you are older than age 70, get at least 1,200 mg of calcium and at least 800 international units (IU) of vitamin D per day. Smoking and drinking excessive alcohol increase the risk of osteoporosis. Eat foods that are rich in calcium and vitamin D, and do weight-bearing exercises several times each week as directed by your health care provider. How does menopause affect my mental health? Depression may occur at any age, but it is more common as you become older. Common symptoms of depression include: Feeling depressed. Changes in sleep patterns. Changes in appetite or eating patterns. Feeling an overall lack of motivation or enjoyment of activities that you previously enjoyed. Frequent crying spells. Talk with your health care provider if you think that you are experiencing any of these symptoms. General instructions See your health care provider for regular wellness exams and vaccines. This may include: Scheduling regular health, dental, and eye exams. Getting and maintaining your vaccines. These include: Influenza vaccine. Get this vaccine each year before the flu season begins. Pneumonia vaccine. Shingles vaccine. Tetanus, diphtheria, and pertussis (Tdap) booster vaccine. Your health care provider may also recommend other immunizations. Tell your health care provider if you have ever been abused or do not feel safe at home. Summary Menopause is a normal process in which your ability to get pregnant comes to an end. This condition causes hot flashes, night sweats, decreased interest in sex, mood swings, headaches, or lack  of sleep. Treatment for this condition may include hormone replacement therapy. Take actions to keep yourself healthy, including exercising regularly, eating a healthy diet, watching your weight, and checking your blood pressure and blood sugar levels. Get screened for cancer and depression. Make sure that you are up to date with all your vaccines. This information is not intended to replace advice given to you by your health care provider. Make sure you discuss any questions you have with your health care provider. Document Revised: 01/27/2021 Document Reviewed: 01/27/2021 Elsevier Patient Education  2023 [employer]  

## 2022-09-10 LAB — MICROALBUMIN / CREATININE URINE RATIO
Creatinine, Urine: 57 mg/dL (ref 20–275)
Microalb Creat Ratio: 14 mcg/mg creat (ref <30)
Microalb, Ur: 0.8 mg/dL

## 2022-09-10 LAB — COMPLETE METABOLIC PANEL WITH GFR
AG Ratio: 1.9 (calc) (ref 1.0–2.5)
ALT: 16 U/L (ref 6–29)
AST: 21 U/L (ref 10–35)
Albumin: 4.5 g/dL (ref 3.6–5.1)
Alkaline phosphatase (APISO): 59 U/L (ref 37–153)
BUN: 14 mg/dL (ref 7–25)
CO2: 26 mmol/L (ref 20–32)
Calcium: 8.9 mg/dL (ref 8.6–10.4)
Chloride: 107 mmol/L (ref 98–110)
Creat: 0.84 mg/dL (ref 0.60–0.95)
Globulin: 2.4 g/dL (calc) (ref 1.9–3.7)
Glucose, Bld: 126 mg/dL — ABNORMAL HIGH (ref 65–99)
Potassium: 4 mmol/L (ref 3.5–5.3)
Sodium: 143 mmol/L (ref 135–146)
Total Bilirubin: 0.4 mg/dL (ref 0.2–1.2)
Total Protein: 6.9 g/dL (ref 6.1–8.1)
eGFR: 70 mL/min/{1.73_m2} (ref >=60)

## 2022-09-10 LAB — LIPID PANEL
Cholesterol: 153 mg/dL (ref <200)
HDL: 57 mg/dL (ref >=50)
LDL Cholesterol (Calc): 78 mg/dL (calc)
Non-HDL Cholesterol (Calc): 96 mg/dL (calc) (ref <130)
Total CHOL/HDL Ratio: 2.7 (calc) (ref <5.0)
Triglycerides: 93 mg/dL (ref <150)

## 2022-09-10 LAB — CBC
HCT: 42.4 % (ref 35.0–45.0)
Hemoglobin: 14.2 g/dL (ref 11.7–15.5)
MCH: 31.3 pg (ref 27.0–33.0)
MCHC: 33.5 g/dL (ref 32.0–36.0)
MCV: 93.4 fL (ref 80.0–100.0)
MPV: 13.8 fL — ABNORMAL HIGH (ref 7.5–12.5)
Platelets: 132 10*3/uL — ABNORMAL LOW (ref 140–400)
RBC: 4.54 10*6/uL (ref 3.80–5.10)
RDW: 12.4 % (ref 11.0–15.0)
WBC: 6.4 10*3/uL (ref 3.8–10.8)

## 2022-09-10 LAB — HEMOGLOBIN A1C
Hgb A1c MFr Bld: 6.5 % of total Hgb — ABNORMAL HIGH (ref <5.7)
Mean Plasma Glucose: 140 mg/dL
eAG (mmol/L): 7.7 mmol/L

## 2022-09-10 LAB — T4, FREE: Free T4: 1.3 ng/dL (ref 0.8–1.8)

## 2022-09-10 LAB — TSH: TSH: 0.74 mIU/L (ref 0.40–4.50)

## 2022-10-08 ENCOUNTER — Encounter: Payer: Self-pay | Admitting: Internal Medicine

## 2022-10-08 ENCOUNTER — Ambulatory Visit (INDEPENDENT_AMBULATORY_CARE_PROVIDER_SITE_OTHER): Payer: Medicare Other | Admitting: Internal Medicine

## 2022-10-08 VITALS — BP 136/76 | HR 74 | Temp 97.3°F | Wt 178.0 lb

## 2022-10-08 DIAGNOSIS — J441 Chronic obstructive pulmonary disease with (acute) exacerbation: Secondary | ICD-10-CM

## 2022-10-08 MED ORDER — PREDNISONE 10 MG PO TABS: ORAL_TABLET | ORAL | 0 refills | Status: DC

## 2022-10-08 MED ORDER — AZITHROMYCIN 250 MG PO TABS: ORAL_TABLET | ORAL | 0 refills | Status: DC

## 2022-10-08 NOTE — Progress Notes (Signed)
Subjective:    Patient ID: Brooke King, female    DOB: Nov 25, 1940, 82 y.o.   MRN: [id number]  HPI  Patient presents to clinic today for follow-up of headache, runny nose, sore throat, cough and shortness of breath.  She reports this started 1 month ago, got better but worsened again within the last week.  She is blowing clear mucus out of her nose.  She denies difficulty swallowing.  The cough is productive of green mucus.  She denies nasal congestion, ear pain, chest pain, vomiting or diarrhea.  She has had chills but denies fever and body aches.  She has tried allergy medication and Mucinex OTC with minimal relief of symptom.  She has not had sick contacts that she is aware of.  She was treated for a URI 12/20 with Azithromycin.  Review of Systems     Past Medical History:  Diagnosis Date   Allergy    B12 deficiency    Chest pain syndrome    COPD (chronic obstructive pulmonary disease) (HCC)    Depression    GERD (gastroesophageal reflux disease)    Glaucoma    History of colonic polyps    History of degenerative disc disease    c-spine, status post cervical laminectomy   Hyperlipidemia    Hypertension    Hypothyroidism    Insomnia    Migraine    Restless leg syndrome    Seasonal allergies    Sleep apnea    Thyroid cancer (HCC)    Urinary incontinence     Current Outpatient Medications  Medication Sig Dispense Refill   acetaminophen (TYLENOL) 500 MG tablet Take 500 mg by mouth every 6 (six) hours as needed.     albuterol (VENTOLIN HFA) 108 (90 Base) MCG/ACT inhaler USE 1 TO 2 INHALATIONS BY MOUTH  INTO THE LUNGS EVERY 6 HOURS AS  NEEDED FOR WHEEZING OR SHORTNESS OF BREATH 34 g 3   atorvastatin (LIPITOR) 10 MG tablet TAKE 1 TABLET BY MOUTH  DAILY 90 tablet 1   azithromycin (ZITHROMAX) 250 MG tablet Take 2 tabs today, then 1 tab daily x 4 days 6 tablet 0   Calcium Carb-Cholecalciferol (CALCIUM + D3) 600-200 MG-UNIT TABS Take by mouth.     cetirizine (ZYRTEC) 10 MG tablet  Take 10 mg by mouth daily.     Cranberry 180 MG CAPS Take by mouth.     gabapentin (NEURONTIN) 300 MG capsule TAKE 1 CAPSULE BY MOUTH 3  TIMES DAILY (Patient taking differently: Take 300 mg by mouth 3 (three) times daily.) 270 capsule 3   hydrOXYzine (ATARAX/VISTARIL) 10 MG tablet Take 1 tablet (10 mg total) by mouth daily as needed. 30 tablet 0   latanoprost (XALATAN) 0.005 % ophthalmic solution Place 1 drop into both eyes at bedtime.     montelukast (SINGULAIR) 10 MG tablet TAKE 1 TABLET BY MOUTH AT  BEDTIME 90 tablet 1   omeprazole (PRILOSEC) 20 MG capsule TAKE 1 CAPSULE BY MOUTH DAILY 90 capsule 3   SYNTHROID 112 MCG tablet TAKE 1 TABLET BY MOUTH  DAILY BEFORE BREAKFAST 90 tablet 2   traZODone (DESYREL) 50 MG tablet Take 0.5 tablets (25 mg total) by mouth at bedtime as needed for sleep. (Patient not taking: Reported on 09/09/2022) 45 tablet 0   TRELEGY ELLIPTA 100-62.5-25 MCG/ACT AEPB Inhale 1 puff into the lungs daily. 1 each 5   triamcinolone cream (KENALOG) 0.1 % Apply 1 application. topically 2 (two) times daily. 30 g 0  valsartan (DIOVAN) 80 MG tablet TAKE 1 TABLET BY MOUTH DAILY 90 tablet 1   vitamin B-12 (CYANOCOBALAMIN) 1000 MCG tablet Take 1,000 mcg by mouth daily.     No current facility-administered medications for this visit.    Allergies  Allergen Reactions   Toviaz [Fesoterodine Fumarate Er] Other (See Comments)    Throat closed   Cortisone    Doxycycline Hyclate    Hydrocodone    Keflex [Cephalexin]    Septra [Sulfamethoxazole-Trimethoprim]    Tagamet [Cimetidine]    Zoloft [Sertraline]    Penicillins Rash    Family History  Problem Relation Age of Onset   Heart disease Mother    Breast cancer Neg Hx     Social History   Socioeconomic History   Marital status: Widowed    Spouse name: Not on file   Number of children: Not on file   Years of education: Not on file   Highest education level: Not on file  Occupational History   Occupation: Retired   Tobacco Use   Smoking status: Every Day    Packs/day: 1.00    Years: 40.00    Total pack years: 40.00    Types: Cigarettes   Smokeless tobacco: Never  Vaping Use   Vaping Use: Never used  Substance and Sexual Activity   Alcohol use: No   Drug use: No   Sexual activity: Not on file  Other Topics Concern   Not on file  Social History Narrative   Not on file   Social Determinants of Health   Financial Resource Strain: Low Risk  (03/09/2022)   Overall Financial Resource Strain (CARDIA)    Difficulty of Paying Living Expenses: Not hard at all  Food Insecurity: No Food Insecurity (03/09/2022)   Hunger Vital Sign    Worried About Running Out of Food in the Last Year: Never true    [place] in the Last Year: Never true  Transportation Needs: No Transportation Needs (03/09/2022)   PRAPARE - Hydrologist (Medical): No    Lack of Transportation (Non-Medical): No  Physical Activity: Sufficiently Active (03/09/2022)   Exercise Vital Sign    Days of Exercise per Week: 7 days    Minutes of Exercise per Session: 30 min  Stress: Stress Concern Present (03/09/2022)   [place]    Feeling of Stress : To some extent  Social Connections: Not on file  Intimate Partner Violence: Not At Risk (03/09/2022)   Humiliation, Afraid, Rape, and Kick questionnaire    Fear of Current or Ex-Partner: No    Emotionally Abused: No    Physically Abused: No    Sexually Abused: No     Constitutional: Patient reports headache and chills.  Denies fever, malaise, fatigue,  or abrupt weight changes.  HEENT: Patient reports runny nose and sore throat.  Denies eye pain, eye redness, ear pain, ringing in the ears, wax buildup, nasal congestion, bloody nose. Respiratory: Patient reports cough and shortness of breath.  Denies difficulty breathing.   Cardiovascular: Denies chest pain, chest tightness, palpitations or  swelling in the hands or feet.  Gastrointestinal: Denies abdominal pain, bloating, constipation, diarrhea or blood in the stool.   No other specific complaints in a complete review of systems (except as listed in HPI above).  Objective:   Physical Exam  BP 136/76 (BP Location: Left Arm, Patient Position: Sitting, Cuff Size: Normal)   Pulse 74  Temp (!) 97.3 F (36.3 C) (Temporal)   Wt 178 lb (80.7 kg)   SpO2 99%   BMI 31.04 kg/m   Wt Readings from Last 3 Encounters:  09/09/22 174 lb (78.9 kg)  07/17/22 177 lb (80.3 kg)  06/30/22 177 lb (80.3 kg)    General: Appears her stated age, obese, in NAD. Skin: Warm, dry and intact. No rashes noted. HEENT: Head: normal shape and size, no sinus tenderness noted; Nose: mucosa pink and moist, septum midline; Throat/Mouth: Teeth present, mucosa erythematous and moist, + PND, no exudate, lesions or ulcerations noted.  Neck: No adenopathy noted. Cardiovascular: Normal rate and rhythm. S1,S2 noted.  No murmur, rubs or gallops noted.  Pulmonary/Chest: Normal effort and coarse breath sounds with scattered wheezing and rhonchi throughout. No respiratory distress. No rales noted.  Musculoskeletal:  No difficulty with gait.  Neurological: Alert and oriented.    BMET    Component Value Date/Time   NA 143 09/09/2022 0943   K 4.0 09/09/2022 0943   K 3.8 12/15/2013 1202   CL 107 09/09/2022 0943   CO2 26 09/09/2022 0943   GLUCOSE 126 (H) 09/09/2022 0943   BUN 14 09/09/2022 0943   CREATININE 0.84 09/09/2022 0943   CALCIUM 8.9 09/09/2022 0943   GFRNONAA >60 01/19/2012 0949   GFRAA >60 01/19/2012 0949    Lipid Panel     Component Value Date/Time   CHOL 153 09/09/2022 0943   TRIG 93 09/09/2022 0943   HDL 57 09/09/2022 0943   CHOLHDL 2.7 09/09/2022 0943   LDLCALC 78 09/09/2022 0943    CBC    Component Value Date/Time   WBC 6.4 09/09/2022 0943   RBC 4.54 09/09/2022 0943   HGB 14.2 09/09/2022 0943   HCT 42.4 09/09/2022 0943   PLT 132  (L) 09/09/2022 0943   MCV 93.4 09/09/2022 0943   MCH 31.3 09/09/2022 0943   MCHC 33.5 09/09/2022 0943   RDW 12.4 09/09/2022 0943   LYMPHSABS 2,310 02/10/2022 1333   EOSABS 392 02/10/2022 1333   BASOSABS 91 02/10/2022 1333    Hgb A1C Lab Results  Component Value Date   HGBA1C 6.5 (H) 09/09/2022           Assessment & Plan:   COPD Exacerbation:  Encouraged rest and fluids Rx for Pred taper x 6 days Rx resent Azithromycin 250 mg x 5 days Encouraged her to pick up her Trelegy and start using this Continue Albuterol as needed  RTC in 5 months for follow-up of chronic conditions Webb Silversmith, NP

## 2022-10-08 NOTE — Patient Instructions (Signed)
COPD Exacerbation This video will teach you what types of triggers can make COPD worse, and how to avoid them. To view the content, go to this web address: [email or web address]  This video will expire on: 05/26/2024. If you need access to this video following this date, please reach out to the healthcare provider who assigned it to you. This information is not intended to replace advice given to you by your health care provider. Make sure you discuss any questions you have with your health care provider. Elsevier Patient Education  [place].

## 2022-10-16 ENCOUNTER — Ambulatory Visit (INDEPENDENT_AMBULATORY_CARE_PROVIDER_SITE_OTHER): Payer: Medicare Other | Admitting: Internal Medicine

## 2022-10-16 ENCOUNTER — Encounter: Payer: Self-pay | Admitting: Internal Medicine

## 2022-10-16 VITALS — BP 148/86 | HR 75 | Temp 96.2°F | Wt 177.0 lb

## 2022-10-16 DIAGNOSIS — R110 Nausea: Secondary | ICD-10-CM | POA: Diagnosis not present

## 2022-10-16 DIAGNOSIS — J029 Acute pharyngitis, unspecified: Secondary | ICD-10-CM

## 2022-10-16 LAB — POCT RAPID STREP A (OFFICE): Rapid Strep A Screen: NEGATIVE

## 2022-10-16 MED ORDER — CLINDAMYCIN HCL 300 MG PO CAPS: 300.0000 mg | ORAL_CAPSULE | Freq: Two times a day (BID) | ORAL | 0 refills | Status: DC

## 2022-10-16 MED ORDER — NYSTATIN 100000 UNIT/ML MT SUSP: 5.0000 mL | Freq: Four times a day (QID) | OROMUCOSAL | 0 refills | Status: DC

## 2022-10-16 NOTE — Patient Instructions (Signed)
Sore Throat A sore throat is pain, burning, irritation, or scratchiness in the throat. When you have a sore throat, you may feel pain or tenderness in your throat when you swallow or talk. Many things can cause a sore throat, including: An infection. Seasonal allergies. Dryness in the air. Irritants, such as smoke or pollution. Radiation treatment for cancer. Gastroesophageal reflux disease (GERD). A tumor. A sore throat is often the first sign of another sickness. It may happen with other symptoms, such as coughing, sneezing, fever, and swollen neck glands. Most sore throats go away without medical treatment. Follow these instructions at home:     Medicines Take over-the-counter and prescription medicines only as told by your health care provider. Children often get sore throats. Do not give your child aspirin because of the association with Reye's syndrome. Use throat sprays to soothe your throat as told by your health care provider. Managing pain To help with pain, try: Sipping warm liquids, such as broth, herbal tea, or warm water. Eating or drinking cold or frozen liquids, such as frozen ice pops. Gargling with a mixture of salt and water 3-4 times a day or as needed. To make salt water, completely dissolve -1 tsp (3-6 g) of salt in 1 cup (237 mL) of warm water. Sucking on hard candy or throat lozenges. Putting a cool-mist humidifier in your bedroom at night to moisten the air. Sitting in the bathroom with the door closed for 5-10 minutes while you run hot water in the shower. General instructions Do not use any products that contain nicotine or tobacco. These products include cigarettes, chewing tobacco, and vaping devices, such as e-cigarettes. If you need help quitting, ask your health care provider. Rest as needed. Drink enough fluid to keep your urine pale yellow. Wash your hands often with soap and water for at least 20 seconds. If soap and water are not available, use hand  sanitizer. Contact a health care provider if: You have a fever for more than 2-3 days. You have symptoms that last for more than 2-3 days. Your throat does not get better within 7 days. You have a fever and your symptoms suddenly get worse. Get help right away if: You have difficulty breathing. You cannot swallow fluids, soft foods, or your saliva. You have increased swelling in your throat or neck. You have persistent nausea and vomiting. These symptoms may represent a serious problem that is an emergency. Do not wait to see if the symptoms will go away. Get medical help right away. Call your local emergency services (911 in the [place] Do not drive yourself to the hospital. Summary A sore throat is pain, burning, irritation, or scratchiness in the throat. Many things can cause a sore throat. Take over-the-counter medicines only as told by your health care provider. Rest as needed. Drink enough fluid to keep your urine pale yellow. Contact a health care provider if your throat does not get better within 7 days. This information is not intended to replace advice given to you by your health care provider. Make sure you discuss any questions you have with your health care provider. Document Revised: 12/04/2020 Document Reviewed: 12/04/2020 Elsevier Patient Education  [place].

## 2022-10-16 NOTE — Progress Notes (Signed)
Subjective:    Patient ID: Brooke King, female    DOB: 05/23/1941, 82 y.o.   MRN: [id number]  HPI  Patient presents to clinic today with complaint of runny nose, sore throat and nausea.  This started 2 week ago. She is blowing clear mucous out of her nose. She is having some difficulty swallowing. She denies headache, nasal congestion, ear pain, cough, shortness of breath, chest pain, vomiting or diarrhea. She denies fever, chills or body aches.  She has been using Chloraseptic with minimal relief of symptoms. She was seen 10/08/2022 with complaint of cough and shortness of breath.  She was treated with and Azithromycin Prednisone taper for COPD exacerbation. She denies sick contacts.  Review of Systems     Past Medical History:  Diagnosis Date   Allergy    B12 deficiency    Chest pain syndrome    COPD (chronic obstructive pulmonary disease) (HCC)    Depression    GERD (gastroesophageal reflux disease)    Glaucoma    History of colonic polyps    History of degenerative disc disease    c-spine, status post cervical laminectomy   Hyperlipidemia    Hypertension    Hypothyroidism    Insomnia    Migraine    Restless leg syndrome    Seasonal allergies    Sleep apnea    Thyroid cancer (HCC)    Urinary incontinence     Current Outpatient Medications  Medication Sig Dispense Refill   acetaminophen (TYLENOL) 500 MG tablet Take 500 mg by mouth every 6 (six) hours as needed.     albuterol (VENTOLIN HFA) 108 (90 Base) MCG/ACT inhaler USE 1 TO 2 INHALATIONS BY MOUTH  INTO THE LUNGS EVERY 6 HOURS AS  NEEDED FOR WHEEZING OR SHORTNESS OF BREATH 34 g 3   atorvastatin (LIPITOR) 10 MG tablet TAKE 1 TABLET BY MOUTH  DAILY 90 tablet 1   azithromycin (ZITHROMAX) 250 MG tablet Take 2 tabs today, then 1 tab daily x 4 days 6 tablet 0   Calcium Carb-Cholecalciferol (CALCIUM + D3) 600-200 MG-UNIT TABS Take by mouth.     cetirizine (ZYRTEC) 10 MG tablet Take 10 mg by mouth daily.     Cranberry 180 MG  CAPS Take by mouth.     gabapentin (NEURONTIN) 300 MG capsule TAKE 1 CAPSULE BY MOUTH 3  TIMES DAILY (Patient taking differently: Take 300 mg by mouth 3 (three) times daily.) 270 capsule 3   hydrOXYzine (ATARAX/VISTARIL) 10 MG tablet Take 1 tablet (10 mg total) by mouth daily as needed. 30 tablet 0   latanoprost (XALATAN) 0.005 % ophthalmic solution Place 1 drop into both eyes at bedtime.     montelukast (SINGULAIR) 10 MG tablet TAKE 1 TABLET BY MOUTH AT  BEDTIME 90 tablet 1   omeprazole (PRILOSEC) 20 MG capsule TAKE 1 CAPSULE BY MOUTH DAILY 90 capsule 3   predniSONE (DELTASONE) 10 MG tablet Take 6 tabs on day 1, 5 tabs on day 2, 4 tabs on day 3, 3 tabs on day 4, 2 tabs on day 5, 1 tab on day 6 21 tablet 0   SYNTHROID 112 MCG tablet TAKE 1 TABLET BY MOUTH  DAILY BEFORE BREAKFAST 90 tablet 2   traZODone (DESYREL) 50 MG tablet Take 0.5 tablets (25 mg total) by mouth at bedtime as needed for sleep. 45 tablet 0   TRELEGY ELLIPTA 100-62.5-25 MCG/ACT AEPB Inhale 1 puff into the lungs daily. (Patient not taking: Reported on 10/08/2022) 1 each  5   triamcinolone cream (KENALOG) 0.1 % Apply 1 application. topically 2 (two) times daily. 30 g 0   valsartan (DIOVAN) 80 MG tablet TAKE 1 TABLET BY MOUTH DAILY 90 tablet 1   vitamin B-12 (CYANOCOBALAMIN) 1000 MCG tablet Take 1,000 mcg by mouth daily.     No current facility-administered medications for this visit.    Allergies  Allergen Reactions   Toviaz [Fesoterodine Fumarate Er] Other (See Comments)    Throat closed   Cortisone    Doxycycline Hyclate    Hydrocodone    Keflex [Cephalexin]    Septra [Sulfamethoxazole-Trimethoprim]    Tagamet [Cimetidine]    Zoloft [Sertraline]    Penicillins Rash    Family History  Problem Relation Age of Onset   Heart disease Mother    Breast cancer Neg Hx     Social History   Socioeconomic History   Marital status: Widowed    Spouse name: Not on file   Number of children: Not on file   Years of education:  Not on file   Highest education level: Not on file  Occupational History   Occupation: Retired  Tobacco Use   Smoking status: Every Day    Packs/day: 1.00    Years: 40.00    Total pack years: 40.00    Types: Cigarettes   Smokeless tobacco: Never  Vaping Use   Vaping Use: Never used  Substance and Sexual Activity   Alcohol use: No   Drug use: No   Sexual activity: Not on file  Other Topics Concern   Not on file  Social History Narrative   Not on file   Social Determinants of Health   Financial Resource Strain: Low Risk  (03/09/2022)   Overall Financial Resource Strain (CARDIA)    Difficulty of Paying Living Expenses: Not hard at all  Food Insecurity: No Food Insecurity (03/09/2022)   Hunger Vital Sign    Worried About Running Out of Food in the Last Year: Never true    [place] in the Last Year: Never true  Transportation Needs: No Transportation Needs (03/09/2022)   PRAPARE - Hydrologist (Medical): No    Lack of Transportation (Non-Medical): No  Physical Activity: Sufficiently Active (03/09/2022)   Exercise Vital Sign    Days of Exercise per Week: 7 days    Minutes of Exercise per Session: 30 min  Stress: Stress Concern Present (03/09/2022)   [place]    Feeling of Stress : To some extent  Social Connections: Not on file  Intimate Partner Violence: Not At Risk (03/09/2022)   Humiliation, Afraid, Rape, and Kick questionnaire    Fear of Current or Ex-Partner: No    Emotionally Abused: No    Physically Abused: No    Sexually Abused: No     Constitutional: Denies fever, malaise, fatigue, headache or abrupt weight changes.  HEENT: Patient reports runny nose, sore throat.  Denies eye pain, eye redness, ear pain, ringing in the ears, wax buildup, nasal congestion, bloody nose. Respiratory: Denies difficulty breathing, shortness of breath, cough or sputum production.    Cardiovascular: Denies chest pain, chest tightness, palpitations or swelling in the hands or feet.  Gastrointestinal: Patient reports nausea.  Denies abdominal pain, bloating, constipation, diarrhea or blood in the stool.  GU: Denies urgency, frequency, pain with urination, burning sensation, blood in urine, odor or discharge. Musculoskeletal: Denies decrease in range of motion, difficulty with  gait, muscle pain or joint pain and swelling.  Skin: Denies redness, rashes, lesions or ulcercations.    No other specific complaints in a complete review of systems (except as listed in HPI above).  Objective:   Physical Exam  BP (!) 148/86 (BP Location: Left Arm, Patient Position: Sitting, Cuff Size: Normal)   Pulse 75   Temp (!) 96.2 F (35.7 C) (Temporal)   Wt 177 lb (80.3 kg)   SpO2 99%   BMI 30.86 kg/m   Wt Readings from Last 3 Encounters:  10/08/22 178 lb (80.7 kg)  09/09/22 174 lb (78.9 kg)  07/17/22 177 lb (80.3 kg)    General: Appears her stated age, obese, in NAD. Skin: Warm, dry and intact. No rashes noted. HEENT: Head: normal shape and size; Eyes: sclera [place], no icterus, conjunctiva pink, PERRLA and EOMs intact;  Nose: mucosa erythematous and moist, septum midline; Throat/Mouth: Teeth present, mucosa pink and moist, no exudate, lesions or ulcerations noted.  Neck:  No adenopathy noted. Cardiovascular: Normal rate and rhythm. S1,S2 noted.  No murmur, rubs or gallops noted.  Pulmonary/Chest: Normal effort and coarse breath sounds. No respiratory distress. No wheezes, rales or ronchi noted.  Abdomen: Soft and nontender.  Musculoskeletal:  No difficulty with gait.  Neurological: Alert and oriented.    BMET    Component Value Date/Time   NA 143 09/09/2022 0943   K 4.0 09/09/2022 0943   K 3.8 12/15/2013 1202   CL 107 09/09/2022 0943   CO2 26 09/09/2022 0943   GLUCOSE 126 (H) 09/09/2022 0943   BUN 14 09/09/2022 0943   CREATININE 0.84 09/09/2022 0943   CALCIUM 8.9  09/09/2022 0943   GFRNONAA >60 01/19/2012 0949   GFRAA >60 01/19/2012 0949    Lipid Panel     Component Value Date/Time   CHOL 153 09/09/2022 0943   TRIG 93 09/09/2022 0943   HDL 57 09/09/2022 0943   CHOLHDL 2.7 09/09/2022 0943   LDLCALC 78 09/09/2022 0943    CBC    Component Value Date/Time   WBC 6.4 09/09/2022 0943   RBC 4.54 09/09/2022 0943   HGB 14.2 09/09/2022 0943   HCT 42.4 09/09/2022 0943   PLT 132 (L) 09/09/2022 0943   MCV 93.4 09/09/2022 0943   MCH 31.3 09/09/2022 0943   MCHC 33.5 09/09/2022 0943   RDW 12.4 09/09/2022 0943   LYMPHSABS 2,310 02/10/2022 1333   EOSABS 392 02/10/2022 1333   BASOSABS 91 02/10/2022 1333    Hgb A1C Lab Results  Component Value Date   HGBA1C 6.5 (H) 09/09/2022            Assessment & Plan:    Runny Nose, Sore Throat, Nausea:  She has been sick for over 6 weeks with a negative flu, COVID test Rapid strep today negative She was previously treated with prednisone and azithromycin with no improvement symptoms Rx for Clindamycin 300 mg twice daily x 10 days Rx for Nystatin swish and swallow in case there is yeast in the esophagus  RTC in 5 months for follow-up of chronic conditions Webb Silversmith, NP

## 2022-10-18 ENCOUNTER — Other Ambulatory Visit: Payer: Self-pay | Admitting: Internal Medicine

## 2022-10-18 DIAGNOSIS — E039 Hypothyroidism, unspecified: Secondary | ICD-10-CM

## 2022-10-18 DIAGNOSIS — E1142 Type 2 diabetes mellitus with diabetic polyneuropathy: Secondary | ICD-10-CM

## 2022-10-19 NOTE — Telephone Encounter (Signed)
Requested Prescriptions  Pending Prescriptions Disp Refills   gabapentin (NEURONTIN) 300 MG capsule [Pharmacy Med Name: Gabapentin 300 MG Oral Capsule] 270 capsule 3    Sig: TAKE 1 CAPSULE BY MOUTH 3 TIMES  DAILY     Neurology: Anticonvulsants - gabapentin Passed - 10/18/2022 10:15 PM      Passed - Cr in normal range and within 360 days    Creat  Date Value Ref Range Status  09/09/2022 0.84 0.60 - 0.95 mg/dL Final   Creatinine, Urine  Date Value Ref Range Status  09/09/2022 57 20 - 275 mg/dL Final         Passed - Completed PHQ-2 or PHQ-9 in the last 360 days      Passed - Valid encounter within last 12 months    Recent Outpatient Visits           3 days ago Pharyngitis, unspecified etiology   [facility] [place], Coralie Keens, NP   1 week ago COPD exacerbation [facility])   [facility] [place], Coralie Keens, NP   1 month ago Encounter for general adult medical examination with abnormal findings   [facility] [place], Coralie Keens, NP   1 month ago Simple chronic bronchitis [facility])   [facility] [place], Grayland Ormond A, RPH-CPP   2 months ago Simple chronic bronchitis ([place])   [facility] [place], Virl Diamond, RPH-CPP       Future Appointments             In 1 month [place], Gordan Payment [place]   In 4 months [place], Coralie Keens, NP [facility], [place] 112 MCG tablet [Pharmacy Med Name: SYNTHROID  112MCG  TAB] 90 tablet 3    Sig: TAKE 1 TABLET BY MOUTH DAILY  BEFORE BREAKFAST     Endocrinology:  Hypothyroid Agents Passed - 10/18/2022 10:15 PM      Passed - TSH in normal range and within 360 days    TSH  Date Value Ref Range Status  09/09/2022 0.74 0.40 - 4.50 mIU/L Final         Passed - Valid encounter within last 12 months    Recent Outpatient Visits           3 days ago  Pharyngitis, unspecified etiology   [facility] [place], Coralie Keens, NP   1 week ago COPD exacerbation [facility])   [facility] [place], Coralie Keens, NP   1 month ago Encounter for general adult medical examination with abnormal findings   [facility] [place], Coralie Keens, NP   1 month ago Simple chronic bronchitis [facility])   [place], Grayland Ormond A, RPH-CPP   2 months ago Simple chronic bronchitis [facility])   [facility] [place], Virl Diamond, RPH-CPP       Future Appointments             In 1 month [place], Gordan Payment [place]   In 4 months [place], Coralie Keens, NP [facility], [facility]

## 2022-10-19 NOTE — Telephone Encounter (Signed)
Requested medication (s) are due for refill today - expired Rx  Requested medication (s) are on the active medication list -yes  Future visit scheduled -yes  Last refill: 10/08/21 #270 3RF  Notes to clinic: expired Rx  Requested Prescriptions  Pending Prescriptions Disp Refills   gabapentin (NEURONTIN) 300 MG capsule [Pharmacy Med Name: Gabapentin 300 MG Oral Capsule] 270 capsule 3    Sig: TAKE 1 CAPSULE BY MOUTH 3 TIMES  DAILY     Neurology: Anticonvulsants - gabapentin Passed - 10/18/2022 10:15 PM      Passed - Cr in normal range and within 360 days    Creat  Date Value Ref Range Status  09/09/2022 0.84 0.60 - 0.95 mg/dL Final   Creatinine, Urine  Date Value Ref Range Status  09/09/2022 57 20 - 275 mg/dL Final         Passed - Completed PHQ-2 or PHQ-9 in the last 360 days      Passed - Valid encounter within last 12 months    Recent Outpatient Visits           3 days ago Pharyngitis, unspecified etiology   [place], Coralie Keens, NP   1 week ago COPD exacerbation [facility])   [facility] [place], Coralie Keens, NP   1 month ago Encounter for general adult medical examination with abnormal findings   [facility] [place], Coralie Keens, NP   1 month ago Simple chronic bronchitis [facility])   [facility] Delles, Grayland Ormond A, RPH-CPP   2 months ago Simple chronic bronchitis ([place])   [facility] Delles, Virl Diamond, RPH-CPP       Future Appointments             In 1 month McGowan, Gordan Payment [place]   In 4 months [place], Coralie Keens, NP [facility]            Signed Prescriptions Disp Refills   SYNTHROID 112 MCG tablet 90 tablet 3    Sig: TAKE 1 TABLET BY MOUTH DAILY  BEFORE BREAKFAST     Endocrinology:  Hypothyroid Agents Passed - 10/18/2022 10:15 PM      Passed - TSH in normal range  and within 360 days    TSH  Date Value Ref Range Status  09/09/2022 0.74 0.40 - 4.50 mIU/L Final         Passed - Valid encounter within last 12 months    Recent Outpatient Visits           3 days ago Pharyngitis, unspecified etiology   [facility] [place], Coralie Keens, NP   1 week ago COPD exacerbation [facility])   [facility] [place], Coralie Keens, NP   1 month ago Encounter for general adult medical examination with abnormal findings   [facility] [place], Coralie Keens, NP   1 month ago Simple chronic bronchitis [facility])   [place], Grayland Ormond A, RPH-CPP   2 months ago Simple chronic bronchitis [facility])   [facility] Delles, Virl Diamond, RPH-CPP       Future Appointments             In 1 month McGowan, Gordan Payment [place]   In 4  months Jearld Fenton, NP [facility]               Requested Prescriptions  Pending Prescriptions Disp Refills   gabapentin (NEURONTIN) 300 MG capsule [Pharmacy Med Name: Gabapentin 300 MG Oral Capsule] 270 capsule 3    Sig: TAKE 1 CAPSULE BY MOUTH 3 TIMES  DAILY     Neurology: Anticonvulsants - gabapentin Passed - 10/18/2022 10:15 PM      Passed - Cr in normal range and within 360 days    Creat  Date Value Ref Range Status  09/09/2022 0.84 0.60 - 0.95 mg/dL Final   Creatinine, Urine  Date Value Ref Range Status  09/09/2022 57 20 - 275 mg/dL Final         Passed - Completed PHQ-2 or PHQ-9 in the last 360 days      Passed - Valid encounter within last 12 months    Recent Outpatient Visits           3 days ago Pharyngitis, unspecified etiology   [facility] [place], Coralie Keens, NP   1 week ago COPD exacerbation [facility])   [facility] [place], Coralie Keens, NP   1 month ago Encounter for general adult  medical examination with abnormal findings   [facility] [place], Coralie Keens, NP   1 month ago Simple chronic bronchitis [facility])   [facility] Delles, Grayland Ormond A, RPH-CPP   2 months ago Simple chronic bronchitis ([place])   [facility] Delles, Virl Diamond, RPH-CPP       Future Appointments             In 1 month McGowan, Gordan Payment [place]   In 4 months [place], Coralie Keens, NP [facility]            Signed Prescriptions Disp Refills   SYNTHROID 112 MCG tablet 90 tablet 3    Sig: TAKE 1 TABLET BY MOUTH DAILY  BEFORE BREAKFAST     Endocrinology:  Hypothyroid Agents Passed - 10/18/2022 10:15 PM      Passed - TSH in normal range and within 360 days    TSH  Date Value Ref Range Status  09/09/2022 0.74 0.40 - 4.50 mIU/L Final         Passed - Valid encounter within last 12 months    Recent Outpatient Visits           3 days ago Pharyngitis, unspecified etiology   [facility] [place], Coralie Keens, NP   1 week ago COPD exacerbation [facility])   [facility] [place], Coralie Keens, NP   1 month ago Encounter for general adult medical examination with abnormal findings   [facility] [place], Coralie Keens, NP   1 month ago Simple chronic bronchitis [facility])   [place], Grayland Ormond A, RPH-CPP   2 months ago Simple chronic bronchitis [facility])   [facility] Delles, Virl Diamond, RPH-CPP       Future Appointments             In 1 month McGowan, Gordan Payment [place]   In 4 months [place], Coralie Keens, NP [facility]

## 2022-10-27 DIAGNOSIS — G4733 Obstructive sleep apnea (adult) (pediatric): Secondary | ICD-10-CM | POA: Diagnosis not present

## 2022-11-30 NOTE — Progress Notes (Unsigned)
12/01/22 11:25 AM   Brooke King 01-25-1941 IN:[id number]  Referring provider:  Jearld Fenton, NP [address] [place],  [place] [address]  Urological history  1. Incontinence -contributing factors of obesity, vaginal delivery, age, smoking, vaginal atrophy and sleep apnea -PVR 0 ml -Myrbetriq 50 mg daily  2. 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 Vaginal atrophy  - Unable to tolerate vaginal estrogen cream   4. Cystocele  - evaluated by Dr. Leafy Ro (04/2022)- do not believe a pessary would stay in place, as anatomy of vaginal canal is straight from cuff to introitus- larger at opening than in the canal   HPI: Brooke King is a 82 y.o.female who presents today for a 6 month follow-up with OAB questionnaire and PVR.   She is having 8 or more daytime urinations, 1-2 episodes of nocturia with a strong urge to urinate.  She has urge incontinence.  She leaks 3 or more times daily.  She wears panty liners and absorbent pads daily.  She does engage in toilet mapping.  She has been without her Myrbetriq for two months.    Patient denies any modifying or aggravating factors.  Patient denies any gross hematuria, dysuria or suprapubic/flank pain.  Patient denies any fevers, chills, nausea or vomiting.    PVR 0 mL   She has also stopped her inhalers two weeks ago.      PMH: Past Medical History:  Diagnosis Date   Allergy    B12 deficiency    Chest pain syndrome    COPD (chronic obstructive pulmonary disease) (HCC)    Depression    GERD (gastroesophageal reflux disease)    Glaucoma    History of colonic polyps    History of degenerative disc disease    c-spine, status post cervical laminectomy   Hyperlipidemia    Hypertension    Hypothyroidism    Insomnia    Migraine    Restless leg syndrome    Seasonal allergies    Sleep apnea    Thyroid cancer ([place])    Urinary incontinence     Surgical History: Past Surgical History:  Procedure Laterality Date   ABDOMINAL  HYSTERECTOMY     CATARACT EXTRACTION, BILATERAL     CERVICAL LAMINECTOMY  08/2004   CHOLECYSTECTOMY     COLONOSCOPY WITH PROPOFOL N/A 11/18/2015   Procedure: COLONOSCOPY WITH PROPOFOL;  Surgeon: Hulen Luster, MD;  [facility] END[facility]COPY;  Service: Gastroenterology;  Laterality: N/A;   ESOPHAGOGASTRODUODENOSCOPY (EGD) WITH PROPOFOL N/A 11/18/2015   Procedure: ESOPHAGOGASTRODUODENOSCOPY (EGD) WITH PROPOFOL;  Surgeon: Hulen Luster, MD;  Location[facility] END[facility]COPY;  Service: Gastroenterology;  Laterality: N/A;   HEMORRHOID SURGERY     TUBAL LIGATION      Home Medications:  Allergies as of 12/01/2022       Reactions   Toviaz [fesoterodine Fumarate Er] Other (See Comments)   Throat closed   Cortisone    Doxycycline Hyclate    Hydrocodone    Keflex [cephalexin]    Septra [sulfamethoxazole-trimethoprim]    Tagamet [cimetidine]    Zoloft [sertraline]    Penicillins Rash        Medication List        Accurate as of December 01, 2022 11:25 AM. If you have any questions, ask your nurse or doctor.          acetaminophen 500 MG tablet Commonly known as: TYLENOL Take 500 mg by mouth every 6 (six) hours as needed.   albuterol  108 (90 Base) MCG/ACT inhaler Commonly known as: VENTOLIN HFA USE 1 TO 2 INHALATIONS BY MOUTH  INTO THE LUNGS EVERY 6 HOURS AS  NEEDED FOR WHEEZING OR SHORTNESS OF BREATH   atorvastatin 10 MG tablet Commonly known as: LIPITOR TAKE 1 TABLET BY MOUTH  DAILY   Calcium + D3 600-200 MG-UNIT Tabs Take by mouth.   cetirizine 10 MG tablet Commonly known as: ZYRTEC Take 10 mg by mouth daily.   clindamycin 300 MG capsule Commonly known as: Cleocin Take 1 capsule (300 mg total) by mouth 2 (two) times daily.   Cranberry 180 MG Caps Take by mouth.   cyanocobalamin 1000 MCG tablet Commonly known as: VITAMIN B12 Take 1,000 mcg by mouth daily.   gabapentin 300 MG capsule Commonly known as: NEURONTIN TAKE 1 CAPSULE BY MOUTH 3 TIMES  DAILY   hydrOXYzine 10 MG  tablet Commonly known as: ATARAX Take 1 tablet (10 mg total) by mouth daily as needed.   latanoprost 0.005 % ophthalmic solution Commonly known as: XALATAN Place 1 drop into both eyes at bedtime.   mirabegron ER 50 MG Tb24 tablet Commonly known as: MYRBETRIQ Take 1 tablet (50 mg total) by mouth daily. Started by: Zara Council, PA-C   montelukast 10 MG tablet Commonly known as: SINGULAIR TAKE 1 TABLET BY MOUTH AT  BEDTIME   nystatin 100000 UNIT/ML suspension Commonly known as: MYCOSTATIN Take 5 mLs (500,000 Units total) by mouth 4 (four) times daily.   omeprazole 20 MG capsule Commonly known as: PRILOSEC TAKE 1 CAPSULE BY MOUTH DAILY   predniSONE 10 MG tablet Commonly known as: DELTASONE Take 6 tabs on day 1, 5 tabs on day 2, 4 tabs on day 3, 3 tabs on day 4, 2 tabs on day 5, 1 tab on day 6   Synthroid 112 MCG tablet Generic drug: levothyroxine TAKE 1 TABLET BY MOUTH DAILY  BEFORE BREAKFAST   traZODone 50 MG tablet Commonly known as: DESYREL Take 0.5 tablets (25 mg total) by mouth at bedtime as needed for sleep.   Trelegy Ellipta 100-62.5-25 MCG/ACT Aepb Generic drug: Fluticasone-Umeclidin-Vilant Inhale 1 puff into the lungs daily.   triamcinolone cream 0.1 % Commonly known as: KENALOG Apply 1 application. topically 2 (two) times daily.   valsartan 80 MG tablet Commonly known as: DIOVAN TAKE 1 TABLET BY MOUTH DAILY        Allergies:  Allergies  Allergen Reactions   Toviaz [Fesoterodine Fumarate Er] Other (See Comments)    Throat closed   Cortisone    Doxycycline Hyclate    Hydrocodone    Keflex [Cephalexin]    Septra [Sulfamethoxazole-Trimethoprim]    Tagamet [Cimetidine]    Zoloft [Sertraline]    Penicillins Rash    Family History: Family History  Problem Relation Age of Onset   Heart disease Mother    Breast cancer Neg Hx     Social History:  reports that she has been smoking cigarettes. She has a 40.00 pack-year smoking history. She has  never used smokeless tobacco. She reports that she does not drink alcohol and does not use drugs.   Physical Exam: BP 131/75   Pulse 74   Ht '5\' 3"'$  (1.6 m)   Wt 179 lb (81.2 kg)   BMI 31.71 kg/m   Constitutional:  Well nourished. Alert and oriented, No acute distress. HEENT: [place] AT, moist mucus membranes.  Trachea midline, no masses. Cardiovascular: No clubbing, cyanosis, or edema. Respiratory: Normal respiratory effort, no increased work of breathing. Neurologic: Grossly  intact, no focal deficits, moving all 4 extremities. Psychiatric: Normal mood and affect.    Laboratory Data: Hemoglobin A1c (08/2022) 6.5 Serum creatinine (08/2022) 0.84 I have reviewed the labs.    Pertinent Imaging:  12/01/22 10:59  Scan Result 0    Assessment & Plan:    1. Mixed Incontinence - PVR 0 mL -adequate bladder emptying -Has been without Myrbetriq 50 mg for 2 months -she will pick up her prescription tomorrow -Given samples at today's visit to help offset cost  2. Cystocele  -Evaluated by gynecology and they felt the anatomy in the vaginal canal would not allow Pesiri to stay in place -We will manage conservatively  3. Vaginal atrophy -could not tolerate the vaginal estrogen cream  -Advised her to use olive oil as well for the discomfort  4. Concern for patient not being able to afford medications -Whenever I see her in follow-up, she is always without the Myrbetriq for a few weeks for several months -She blames forgetfulness but I am suspicious is due to cost -She also informing that she is stopped her inhalers stating that they gave her pneumonia and thrush and she is also considering stopping her other medications -I asked her directly if she was having issues paying for her medications and she would not give me direct answer, but she did states she was getting some sort of help from the government to pay for some of her medications -She also stated during our visit that she could get  test strips for her glucometer because she needs to have a doctor, but I explained to her that they are available over-the-counter and she can just buy them herself, again making me suspicious that her medications are becoming cost prohibitive -Will continue to establish trust with the patient so that she feels more comfortable being forthright in her economic status so that we can either change medications or get her the financial help she needs  Return for 2nd week of May for follow up .  Montia Haslip, [place] [address] [place], [place] [address] [phone]

## 2022-12-01 ENCOUNTER — Encounter: Payer: Self-pay | Admitting: Urology

## 2022-12-01 ENCOUNTER — Ambulatory Visit: Payer: Medicare Other | Admitting: Urology

## 2022-12-01 VITALS — BP 131/75 | HR 74 | Ht 63.0 in | Wt 179.0 lb

## 2022-12-01 DIAGNOSIS — N952 Postmenopausal atrophic vaginitis: Secondary | ICD-10-CM | POA: Diagnosis not present

## 2022-12-01 DIAGNOSIS — N8111 Cystocele, midline: Secondary | ICD-10-CM

## 2022-12-01 DIAGNOSIS — Z596 Low income: Secondary | ICD-10-CM | POA: Diagnosis not present

## 2022-12-01 DIAGNOSIS — N3946 Mixed incontinence: Secondary | ICD-10-CM

## 2022-12-01 LAB — BLADDER SCAN AMB NON-IMAGING: Scan Result: 0

## 2022-12-01 MED ORDER — MIRABEGRON ER 50 MG PO TB24: 50.0000 mg | ORAL_TABLET | Freq: Every day | ORAL | 3 refills | Status: DC

## 2022-12-28 DIAGNOSIS — H40003 Preglaucoma, unspecified, bilateral: Secondary | ICD-10-CM | POA: Diagnosis not present

## 2022-12-31 ENCOUNTER — Other Ambulatory Visit: Payer: Self-pay | Admitting: Internal Medicine

## 2022-12-31 DIAGNOSIS — E1142 Type 2 diabetes mellitus with diabetic polyneuropathy: Secondary | ICD-10-CM

## 2022-12-31 NOTE — Telephone Encounter (Signed)
Unable to refill per protocol, Rx request is too soon. Last refill 10/20/22 for 90 and 1 refill.  Requested Prescriptions  Pending Prescriptions Disp Refills   gabapentin (NEURONTIN) 300 MG capsule [Pharmacy Med Name: GABAPENTIN CAP 300MG  (NEUR)] 300 capsule 2    Sig: TAKE 1 CAPSULE BY MOUTH 3 TIMES  DAILY     Neurology: Anticonvulsants - gabapentin Passed - 12/31/2022  5:15 AM      Passed - Cr in normal range and within 360 days    Creat  Date Value Ref Range Status  09/09/2022 0.84 0.60 - 0.95 mg/dL Final   Creatinine, Urine  Date Value Ref Range Status  09/09/2022 57 20 - 275 mg/dL Final         Passed - Completed PHQ-2 or PHQ-9 in the last 360 days      Passed - Valid encounter within last 12 months    Recent Outpatient Visits           2 months ago Pharyngitis, unspecified etiology   [place] [facility] [place], Salvadore Oxford, NP   2 months ago COPD exacerbation [facility])   [place] [facility] [place], Salvadore Oxford, NP   3 months ago Encounter for general adult medical examination with abnormal findings   [place] [facility] [place], Salvadore Oxford, NP   3 months ago Simple chronic bronchitis [facility])   [place] [facility] Delles, Gentry Fitz A, RPH-CPP   5 months ago Simple chronic bronchitis [facility])   [place] [facility] Delles, Jackelyn Poling, RPH-CPP       Future Appointments             In 1 month McGowan, Elana [facility] [place]   In 2 months [place], Salvadore Oxford, NP [place] [facility], [facility]

## 2023-01-01 DIAGNOSIS — E119 Type 2 diabetes mellitus without complications: Secondary | ICD-10-CM | POA: Diagnosis not present

## 2023-01-01 DIAGNOSIS — H43813 Vitreous degeneration, bilateral: Secondary | ICD-10-CM | POA: Diagnosis not present

## 2023-01-01 DIAGNOSIS — H401112 Primary open-angle glaucoma, right eye, moderate stage: Secondary | ICD-10-CM | POA: Diagnosis not present

## 2023-01-01 DIAGNOSIS — H401121 Primary open-angle glaucoma, left eye, mild stage: Secondary | ICD-10-CM | POA: Diagnosis not present

## 2023-01-01 LAB — HM DIABETES EYE EXAM

## 2023-01-05 ENCOUNTER — Other Ambulatory Visit: Payer: Self-pay | Admitting: Internal Medicine

## 2023-01-05 ENCOUNTER — Encounter: Payer: Self-pay | Admitting: Internal Medicine

## 2023-01-05 DIAGNOSIS — E7800 Pure hypercholesterolemia, unspecified: Secondary | ICD-10-CM | POA: Diagnosis not present

## 2023-01-05 DIAGNOSIS — E1165 Type 2 diabetes mellitus with hyperglycemia: Secondary | ICD-10-CM | POA: Diagnosis not present

## 2023-01-05 DIAGNOSIS — I10 Essential (primary) hypertension: Secondary | ICD-10-CM | POA: Diagnosis not present

## 2023-01-05 DIAGNOSIS — M8008XA Age-related osteoporosis with current pathological fracture, vertebra(e), initial encounter for fracture: Secondary | ICD-10-CM | POA: Diagnosis not present

## 2023-01-05 DIAGNOSIS — K219 Gastro-esophageal reflux disease without esophagitis: Secondary | ICD-10-CM | POA: Diagnosis not present

## 2023-01-05 NOTE — Telephone Encounter (Signed)
Requested Prescriptions  Pending Prescriptions Disp Refills   montelukast (SINGULAIR) 10 MG tablet [Pharmacy Med Name: Montelukast Sodium 10 MG Oral Tablet] 90 tablet 2    Sig: TAKE 1 TABLET BY MOUTH AT  BEDTIME     Pulmonology:  Leukotriene Inhibitors Passed - 01/05/2023  2:21 AM      Passed - Valid encounter within last 12 months    Recent Outpatient Visits           2 months ago Pharyngitis, unspecified etiology   [place] [facility] [place], Salvadore Oxford, NP   2 months ago COPD exacerbation [facility])   [place] [facility] [place], Salvadore Oxford, NP   3 months ago Encounter for general adult medical examination with abnormal findings   [place] [facility] [place], Salvadore Oxford, NP   4 months ago Simple chronic bronchitis [facility])   [place] [facility] Delles, Gentry Fitz A, RPH-CPP   5 months ago Simple chronic bronchitis [facility])   [place] [facility] Delles, Jackelyn Poling, RPH-CPP       Future Appointments             In 4 weeks McGowan, Elana Alm [facility] [place]   In 2 months [place], Salvadore Oxford, NP [facility]

## 2023-01-12 DIAGNOSIS — E1165 Type 2 diabetes mellitus with hyperglycemia: Secondary | ICD-10-CM | POA: Diagnosis not present

## 2023-01-12 DIAGNOSIS — M810 Age-related osteoporosis without current pathological fracture: Secondary | ICD-10-CM | POA: Diagnosis not present

## 2023-01-12 DIAGNOSIS — E7800 Pure hypercholesterolemia, unspecified: Secondary | ICD-10-CM | POA: Diagnosis not present

## 2023-01-12 DIAGNOSIS — C73 Malignant neoplasm of thyroid gland: Secondary | ICD-10-CM | POA: Diagnosis not present

## 2023-01-12 DIAGNOSIS — F17210 Nicotine dependence, cigarettes, uncomplicated: Secondary | ICD-10-CM | POA: Diagnosis not present

## 2023-01-12 DIAGNOSIS — I10 Essential (primary) hypertension: Secondary | ICD-10-CM | POA: Diagnosis not present

## 2023-01-12 DIAGNOSIS — K219 Gastro-esophageal reflux disease without esophagitis: Secondary | ICD-10-CM | POA: Diagnosis not present

## 2023-01-12 DIAGNOSIS — F17200 Nicotine dependence, unspecified, uncomplicated: Secondary | ICD-10-CM | POA: Diagnosis not present

## 2023-01-13 ENCOUNTER — Other Ambulatory Visit: Payer: Self-pay | Admitting: Internal Medicine

## 2023-01-13 DIAGNOSIS — F5101 Primary insomnia: Secondary | ICD-10-CM

## 2023-01-13 MED ORDER — TRAZODONE HCL 50 MG PO TABS
25.0000 mg | ORAL_TABLET | Freq: Every evening | ORAL | 0 refills | Status: DC | PRN
Start: 2023-01-13 — End: 2023-06-30

## 2023-01-13 NOTE — Telephone Encounter (Signed)
Medication Refill - Medication: traZODone (DESYREL) 50 MG tablet   Has the patient contacted their pharmacy? No   Preferred Pharmacy (with phone number or street name):  [facility] [place], [place] - [address] Phone: [phone]  Fax: [phone]     Has the patient been seen for an appointment in the last year OR does the patient have an upcoming appointment? Yes.    Please assist patient further as she is down to a half a pill left.

## 2023-01-13 NOTE — Telephone Encounter (Signed)
Requested Prescriptions  Pending Prescriptions Disp Refills   traZODone (DESYREL) 50 MG tablet 45 tablet 0    Sig: Take 0.5 tablets (25 mg total) by mouth at bedtime as needed for sleep.     Psychiatry: Antidepressants - Serotonin Modulator Passed - 01/13/2023  1:30 PM      Passed - Valid encounter within last 6 months    Recent Outpatient Visits           2 months ago Pharyngitis, unspecified etiology   [place] [facility] [place], Salvadore Oxford, NP   3 months ago COPD exacerbation [facility])   [place] [facility] [place], Salvadore Oxford, NP   4 months ago Encounter for general adult medical examination with abnormal findings   [place] [facility] [place], Salvadore Oxford, NP   4 months ago Simple chronic bronchitis [facility])   [place] [facility] Delles, Gentry Fitz A, RPH-CPP   5 months ago Simple chronic bronchitis [facility])   [place] [facility] Delles, Jackelyn Poling, RPH-CPP       Future Appointments             In 2 weeks McGowan, Elana Alm [facility] [place]   In 1 month [place], Salvadore Oxford, NP [facility] [place]

## 2023-01-20 ENCOUNTER — Other Ambulatory Visit: Payer: Self-pay | Admitting: Internal Medicine

## 2023-01-20 DIAGNOSIS — J449 Chronic obstructive pulmonary disease, unspecified: Secondary | ICD-10-CM

## 2023-01-21 NOTE — Telephone Encounter (Signed)
Request for #100 for insurance- ok remainder of RF- patient has upcoming appointment Requested Prescriptions  Pending Prescriptions Disp Refills   albuterol (VENTOLIN HFA) 108 (90 Base) MCG/ACT inhaler [Pharmacy Med Name: ALBUTEROL HFA 90MCG/ACT (PA)] 34 g 0    Sig: USE 1 TO 2 INHALATIONS BY MOUTH  INTO THE LUNGS EVERY 6 HOURS AS  NEEDED FOR WHEEZING OR SHORTNESS OF BREATH     Pulmonology:  Beta Agonists 2 Passed - 01/20/2023 10:46 PM      Passed - Last BP in normal range    BP Readings from Last 1 Encounters:  12/01/22 131/75         Passed - Last Heart Rate in normal range    Pulse Readings from Last 1 Encounters:  12/01/22 74         Passed - Valid encounter within last 12 months    Recent Outpatient Visits           3 months ago Pharyngitis, unspecified etiology   [place] [facility] [place], Salvadore Oxford, NP   3 months ago COPD exacerbation [facility])   [place] [facility] [place], Salvadore Oxford, NP   4 months ago Encounter for general adult medical examination with abnormal findings   [place] [facility] [place], Salvadore Oxford, NP   4 months ago Simple chronic bronchitis [facility])   [place] [facility] Delles, Gentry Fitz A, RPH-CPP   6 months ago Simple chronic bronchitis (HCC)   [place] [facility] Delles, Jackelyn Poling, RPH-CPP       Future Appointments             In 1 week McGowan, Elana Alm [facility] Urology [place]   In 1 month [place], Salvadore Oxford, NP [place] [facility]             omeprazole (PRILOSEC) 20 MG capsule [place] Med Name: Omeprazole 20 MG Oral Capsule Delayed Release] 100 capsule 0    Sig: TAKE 1 CAPSULE BY MOUTH DAILY     Gastroenterology: Proton Pump Inhibitors Passed - 01/20/2023 10:46 PM      Passed - Valid encounter within last 12 months    Recent Outpatient Visits           3 months ago Pharyngitis, unspecified etiology   [place]  [facility] [place], Salvadore Oxford, NP   3 months ago COPD exacerbation [facility])   [place] [facility] [place], Salvadore Oxford, NP   4 months ago Encounter for general adult medical examination with abnormal findings   [place] [facility] [place], Salvadore Oxford, NP   4 months ago Simple chronic bronchitis [facility])   [place] [facility] Delles, Gentry Fitz A, RPH-CPP   6 months ago Simple chronic bronchitis [facility])   [place] [facility] Delles, Jackelyn Poling, RPH-CPP       Future Appointments             In 1 week McGowan, Elana Alm [facility] Urology [place]   In 1 month [place], Salvadore Oxford, NP [place] [facility]

## 2023-01-25 DIAGNOSIS — G4733 Obstructive sleep apnea (adult) (pediatric): Secondary | ICD-10-CM | POA: Diagnosis not present

## 2023-02-01 NOTE — Progress Notes (Unsigned)
02/02/23 10:33 AM   [name] [birth year] [id number]  Referring provider:  Lorre Munroe, NP [address] [place],  [place] [address]  Urological history  1. Incontinence -contributing factors of obesity, vaginal delivery, age, smoking, vaginal atrophy and sleep apnea -Myrbetriq 50 mg daily  2. 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 Vaginal atrophy  - Unable to tolerate vaginal estrogen cream   4. Cystocele  - evaluated by Dr. Dalbert Garnet (04/2022)- do not believe a pessary would stay in place, as anatomy of vaginal canal is straight from cuff to introitus- larger at opening than in the canal   HPI: Brooke King is a 82 y.o.female who presents today for a 2 week follow up.    At her visit on 12/01/2022, she is having 8 or more daytime urinations, 1-2 episodes of nocturia with a strong urge to urinate.  She has urge incontinence.  She leaks 3 or more times daily.  She wears panty liners and absorbent pads daily.  She does engage in toilet mapping.  She has been without her Myrbetriq for two months.  Patient denies any modifying or aggravating factors.  Patient denies any gross hematuria, dysuria or suprapubic/flank pain.  Patient denies any fevers, chills, nausea or vomiting.   PVR 0 mL.  She has also stopped her inhalers two weeks ago.   She was given samples of the Myrbetriq 50 mg and a prescription.  Gynecology felt her pelvic anatomy would not allow a pessary to stay in place.  She was also advised to use olive oil/OTC vaginal lubricants.    She is having 1-7 daytime urinations, with nocturia x 1-2 with a strong urge to urinate.  She has incontinence with both stress and urge.  She is leaking 3 more times daily.  She wears absorbent pads daily.  She does engage in toilet mapping.  She is taking the Myrbetriq, but by her history, she is sill having significant urge and stress incontinence.  She states that she is wearing 3 incontinent products at a time.  She states sometimes she will  to stand up and the urine will just pour out.  I asked her if she wanted to continue the Myrbetriq and she stated she felt it was helping somewhat.  She also has been experiencing several weeks of fatigue and anorexia.  She is taking trazodone for sleep and she finds it helpful allowing her to sleep until 5 AM before she has to get up and urinate.  She denies any weight loss.  She states she has notified her PCP of her symptoms.  Patient denies any modifying or aggravating factors.  Patient denies any gross hematuria, dysuria or suprapubic/flank pain.  Patient denies any fevers, chills, nausea or vomiting.    UA yellow clear, specific gravity 1.010, pH 5.5, 0-5 WBCs, 0-2 RBCs, 0-10 epithelial cells and a few bacteria.  PVR 0 mL    PMH: Past Medical History:  Diagnosis Date   Allergy    B12 deficiency    Chest pain syndrome    COPD (chronic obstructive pulmonary disease) (HCC)    Depression    GERD (gastroesophageal reflux disease)    Glaucoma    History of colonic polyps    History of degenerative disc disease    c-spine, status post cervical laminectomy   Hyperlipidemia    Hypertension    Hypothyroidism    Insomnia    Migraine    Restless leg syndrome  Seasonal allergies    Sleep apnea    Thyroid cancer [facility])    Urinary incontinence     Surgical History: Past Surgical History:  Procedure Laterality Date   ABDOMINAL HYSTERECTOMY     CATARACT EXTRACTION, BILATERAL     CERVICAL LAMINECTOMY  08/2004   CHOLECYSTECTOMY     COLONOSCOPY WITH PROPOFOL N/A 11/18/2015   Procedure: COLONOSCOPY WITH PROPOFOL;  Surgeon: Wallace Cullens, MD;  Location: [facility] ENDOSCOPY;  Service: Gastroenterology;  Laterality: N/A;   ESOPHAGOGASTRODUODENOSCOPY (EGD) WITH PROPOFOL N/A 11/18/2015   Procedure: ESOPHAGOGASTRODUODENOSCOPY (EGD) WITH PROPOFOL;  Surgeon: Wallace Cullens, MD;  Location: [facility] ENDOSCOPY;  Service: Gastroenterology;  Laterality: N/A;   HEMORRHOID SURGERY     TUBAL LIGATION      Home  Medications:  Allergies as of 02/02/2023       Reactions   Toviaz [fesoterodine Fumarate Er] Other (See Comments)   Throat closed   Cortisone    Doxycycline Hyclate    Hydrocodone    Keflex [cephalexin]    Septra [sulfamethoxazole-trimethoprim]    Tagamet [cimetidine]    Zoloft [sertraline]    Penicillins Rash        Medication List        Accurate as of Feb 02, 2023 10:33 AM. If you have any questions, ask your nurse or doctor.          acetaminophen 500 MG tablet Commonly known as: TYLENOL Take 500 mg by mouth every 6 (six) hours as needed.   albuterol 108 (90 Base) MCG/ACT inhaler Commonly known as: VENTOLIN HFA USE 1 TO 2 INHALATIONS BY MOUTH  INTO THE LUNGS EVERY 6 HOURS AS  NEEDED FOR WHEEZING OR SHORTNESS OF BREATH   atorvastatin 10 MG tablet Commonly known as: LIPITOR TAKE 1 TABLET BY MOUTH  DAILY   Calcium + D3 600-200 MG-UNIT Tabs Take by mouth.   cetirizine 10 MG tablet Commonly known as: ZYRTEC Take 10 mg by mouth daily.   clindamycin 300 MG capsule Commonly known as: Cleocin Take 1 capsule (300 mg total) by mouth 2 (two) times daily.   Cranberry 180 MG Caps Take by mouth.   cyanocobalamin 1000 MCG tablet Commonly known as: VITAMIN B12 Take 1,000 mcg by mouth daily.   gabapentin 300 MG capsule Commonly known as: NEURONTIN TAKE 1 CAPSULE BY MOUTH 3 TIMES  DAILY   hydrOXYzine 10 MG tablet Commonly known as: ATARAX Take 1 tablet (10 mg total) by mouth daily as needed.   latanoprost 0.005 % ophthalmic solution Commonly known as: XALATAN Place 1 drop into both eyes at bedtime.   mirabegron ER 50 MG Tb24 tablet Commonly known as: MYRBETRIQ Take 1 tablet (50 mg total) by mouth daily.   montelukast 10 MG tablet Commonly known as: SINGULAIR TAKE 1 TABLET BY MOUTH AT  BEDTIME   nystatin 100000 UNIT/ML suspension Commonly known as: MYCOSTATIN Take 5 mLs (500,000 Units total) by mouth 4 (four) times daily.   omeprazole 20 MG  capsule Commonly known as: PRILOSEC TAKE 1 CAPSULE BY MOUTH DAILY   predniSONE 10 MG tablet Commonly known as: DELTASONE Take 6 tabs on day 1, 5 tabs on day 2, 4 tabs on day 3, 3 tabs on day 4, 2 tabs on day 5, 1 tab on day 6   Synthroid 112 MCG tablet Generic drug: levothyroxine TAKE 1 TABLET BY MOUTH DAILY  BEFORE BREAKFAST   traZODone 50 MG tablet Commonly known as: DESYREL Take 0.5 tablets (25 mg total) by mouth at  bedtime as needed for sleep.   Trelegy Ellipta 100-62.5-25 MCG/ACT Aepb Generic drug: Fluticasone-Umeclidin-Vilant Inhale 1 puff into the lungs daily.   triamcinolone cream 0.1 % Commonly known as: KENALOG Apply 1 application. topically 2 (two) times daily.   valsartan 80 MG tablet Commonly known as: DIOVAN TAKE 1 TABLET BY MOUTH DAILY        Allergies:  Allergies  Allergen Reactions   Toviaz [Fesoterodine Fumarate Er] Other (See Comments)    Throat closed   Cortisone    Doxycycline Hyclate    Hydrocodone    Keflex [Cephalexin]    Septra [Sulfamethoxazole-Trimethoprim]    Tagamet [Cimetidine]    Zoloft [Sertraline]    Penicillins Rash    Family History: Family History  Problem Relation Age of Onset   Heart disease Mother    Breast cancer Neg Hx     Social History:  reports that she has been smoking cigarettes. She has a 40.00 pack-year smoking history. She has never used smokeless tobacco. She reports that she does not drink alcohol and does not use drugs.   Physical Exam: BP 124/73   Pulse 80   Ht 5\' 3"  (1.6 m)   Wt 174 lb (78.9 kg)   BMI 30.82 kg/m   Constitutional:  Well nourished. Alert and oriented, No acute distress. HEENT: [place] AT, moist mucus membranes.  Trachea midline Cardiovascular: No clubbing, cyanosis, or edema. Respiratory: Normal respiratory effort, no increased work of breathing. Neurologic: Grossly intact, no focal deficits, moving all 4 extremities. Psychiatric: Normal mood and affect.    Laboratory  Data: Urinalysis  See EPIC and HPI I have reviewed the labs.    Pertinent Imaging:  02/02/23 09:59  Scan Result 0    Assessment & Plan:    1. Mixed Incontinence -UA benign -PVR demonstrates adequate bladder emptying  -I offered her appointment with Dr. Sherron Monday for further evaluation of her incontinence, but she stated she was tired of seeing doctors -I also offered her a cystoscopy for further evaluation of her internal bladder anatomy to see if there is a cause for her incontinence.  Because of her smoking history, I did explain that the cystoscopy would help rule out bladder cancer -I explained how the procedure was performed and she deferred stating she did not want to go through any procedures at this time and would reconsider if her symptoms worsened  -she would like to continue the Myrbetriq -I did notice that I wrote for 90-day supply, but she filled 30 at the time  2. Cystocele  -We will manage conservatively  3. Vaginal atrophy -could not tolerate the vaginal estrogen cream  -Advised her to use olive oil as well for the discomfort  Return in about 6 months (around 08/05/2023) for OAB, PVR and UA .  Cloretta Ned   [facility] [address] [place], [place] [address] [phone]

## 2023-02-02 ENCOUNTER — Ambulatory Visit: Payer: Medicare Other | Admitting: Urology

## 2023-02-02 ENCOUNTER — Encounter: Payer: Self-pay | Admitting: Urology

## 2023-02-02 VITALS — BP 124/73 | HR 80 | Ht 63.0 in | Wt 174.0 lb

## 2023-02-02 DIAGNOSIS — N3946 Mixed incontinence: Secondary | ICD-10-CM

## 2023-02-02 DIAGNOSIS — N952 Postmenopausal atrophic vaginitis: Secondary | ICD-10-CM

## 2023-02-02 DIAGNOSIS — N8111 Cystocele, midline: Secondary | ICD-10-CM

## 2023-02-02 LAB — MICROSCOPIC EXAMINATION

## 2023-02-02 LAB — BLADDER SCAN AMB NON-IMAGING: Scan Result: 0

## 2023-02-02 LAB — URINALYSIS, COMPLETE
Bilirubin, UA: NEGATIVE
Glucose, UA: NEGATIVE
Ketones, UA: NEGATIVE
Leukocytes,UA: NEGATIVE
Nitrite, UA: NEGATIVE
Protein,UA: NEGATIVE
RBC, UA: NEGATIVE
Specific Gravity, UA: 1.01 (ref 1.005–1.030)
Urobilinogen, Ur: 0.2 mg/dL (ref 0.2–1.0)
pH, UA: 5.5 (ref 5.0–7.5)

## 2023-02-16 ENCOUNTER — Encounter: Payer: Self-pay | Admitting: Internal Medicine

## 2023-02-16 ENCOUNTER — Ambulatory Visit (INDEPENDENT_AMBULATORY_CARE_PROVIDER_SITE_OTHER): Payer: Medicare Other | Admitting: Internal Medicine

## 2023-02-16 VITALS — BP 134/78 | HR 83 | Temp 96.8°F | Wt 173.0 lb

## 2023-02-16 DIAGNOSIS — J441 Chronic obstructive pulmonary disease with (acute) exacerbation: Secondary | ICD-10-CM | POA: Diagnosis not present

## 2023-02-16 MED ORDER — AZITHROMYCIN 250 MG PO TABS: ORAL_TABLET | ORAL | 0 refills | Status: DC

## 2023-02-16 MED ORDER — PREDNISONE 10 MG PO TABS: ORAL_TABLET | ORAL | 0 refills | Status: DC

## 2023-02-16 NOTE — Progress Notes (Signed)
Subjective:    Patient ID: Brooke King, female    DOB: [birth year], 82 y.o.   MRN: [id number]  HPI  Patient presents to clinic today with complaint of fatigue, headache, sinus pressure, runny nose, cough and shortness of breath. This started 1 week ago. The headaches is located in her sinuses. She describes the pain as pressure. She is blowing clear mucous out of her nose. The cough is productive of Blank/yellow mucous. She denies nasal congestion, ear pain, sore throat, chest tightness, vomiting, or diarrhea. She denies fever, chills or body aches. She has tried Mucinex and Zyrtec with minimal relief of symptoms.  She has not had sick contact that she is aware of. She has a history of COPD managed on Trelegy and Albuterol. She does smoke.  Review of Systems     Past Medical History:  Diagnosis Date   Allergy    B12 deficiency    Chest pain syndrome    COPD (chronic obstructive pulmonary disease) (HCC)    Depression    GERD (gastroesophageal reflux disease)    Glaucoma    History of colonic polyps    History of degenerative disc disease    c-spine, status post cervical laminectomy   Hyperlipidemia    Hypertension    Hypothyroidism    Insomnia    Migraine    Restless leg syndrome    Seasonal allergies    Sleep apnea    Thyroid cancer (HCC)    Urinary incontinence     Current Outpatient Medications  Medication Sig Dispense Refill   acetaminophen (TYLENOL) 500 MG tablet Take 500 mg by mouth every 6 (six) hours as needed.     albuterol (VENTOLIN HFA) 108 (90 Base) MCG/ACT inhaler USE 1 TO 2 INHALATIONS BY MOUTH  INTO THE LUNGS EVERY 6 HOURS AS  NEEDED FOR WHEEZING OR SHORTNESS OF BREATH 34 g 0   atorvastatin (LIPITOR) 10 MG tablet TAKE 1 TABLET BY MOUTH  DAILY 90 tablet 1   Calcium Carb-Cholecalciferol (CALCIUM + D3) 600-200 MG-UNIT TABS Take by mouth.     cetirizine (ZYRTEC) 10 MG tablet Take 10 mg by mouth daily.     clindamycin (CLEOCIN) 300 MG capsule Take 1 capsule (300 mg  total) by mouth 2 (two) times daily. 20 capsule 0   Cranberry 180 MG CAPS Take by mouth.     gabapentin (NEURONTIN) 300 MG capsule TAKE 1 CAPSULE BY MOUTH 3 TIMES  DAILY 270 capsule 1   hydrOXYzine (ATARAX/VISTARIL) 10 MG tablet Take 1 tablet (10 mg total) by mouth daily as needed. 30 tablet 0   latanoprost (XALATAN) 0.005 % ophthalmic solution Place 1 drop into both eyes at bedtime.     mirabegron ER (MYRBETRIQ) 50 MG TB24 tablet Take 1 tablet (50 mg total) by mouth daily. 90 tablet 3   montelukast (SINGULAIR) 10 MG tablet TAKE 1 TABLET BY MOUTH AT  BEDTIME 90 tablet 2   nystatin (MYCOSTATIN) 100000 UNIT/ML suspension Take 5 mLs (500,000 Units total) by mouth 4 (four) times daily. 75 mL 0   omeprazole (PRILOSEC) 20 MG capsule TAKE 1 CAPSULE BY MOUTH DAILY 100 capsule 0   predniSONE (DELTASONE) 10 MG tablet Take 6 tabs on day 1, 5 tabs on day 2, 4 tabs on day 3, 3 tabs on day 4, 2 tabs on day 5, 1 tab on day 6 21 tablet 0   SYNTHROID 112 MCG tablet TAKE 1 TABLET BY MOUTH DAILY  BEFORE BREAKFAST 90 tablet 3  traZODone (DESYREL) 50 MG tablet Take 0.5 tablets (25 mg total) by mouth at bedtime as needed for sleep. 45 tablet 0   TRELEGY ELLIPTA 100-62.5-25 MCG/ACT AEPB Inhale 1 puff into the lungs daily. 1 each 5   triamcinolone cream (KENALOG) 0.1 % Apply 1 application. topically 2 (two) times daily. 30 g 0   valsartan (DIOVAN) 80 MG tablet TAKE 1 TABLET BY MOUTH DAILY 90 tablet 1   vitamin B-12 (CYANOCOBALAMIN) 1000 MCG tablet Take 1,000 mcg by mouth daily.     No current facility-administered medications for this visit.    Allergies  Allergen Reactions   Toviaz [Fesoterodine Fumarate Er] Other (See Comments)    Throat closed   Cortisone    Doxycycline Hyclate    Hydrocodone    Keflex [Cephalexin]    Septra [Sulfamethoxazole-Trimethoprim]    Tagamet [Cimetidine]    Zoloft [Sertraline]    Penicillins Rash    Family History  Problem Relation Age of Onset   Heart disease Mother     Breast cancer Neg Hx     Social History   Socioeconomic History   Marital status: Widowed    Spouse name: Not on file   Number of children: Not on file   Years of education: Not on file   Highest education level: Not on file  Occupational History   Occupation: Retired  Tobacco Use   Smoking status: Every Day    Packs/day: 1.00    Years: 40.00    Additional pack years: 0.00    Total pack years: 40.00    Types: Cigarettes   Smokeless tobacco: Never  Vaping Use   Vaping Use: Never used  Substance and Sexual Activity   Alcohol use: No   Drug use: No   Sexual activity: Not on file  Other Topics Concern   Not on file  Social History Narrative   Not on file   Social Determinants of Health   Financial Resource Strain: Low Risk  (03/09/2022)   Overall Financial Resource Strain (CARDIA)    Difficulty of Paying Living Expenses: Not hard at all  Food Insecurity: No Food Insecurity (03/09/2022)   Hunger Vital Sign    Worried About Running Out of Food in the Last Year: Never true    Ran Out of Food in the Last Year: Never true  Transportation Needs: No Transportation Needs (03/09/2022)   PRAPARE - Administrator, Civil Service (Medical): No    Lack of Transportation (Non-Medical): No  Physical Activity: Sufficiently Active (03/09/2022)   Exercise Vital Sign    Days of Exercise per Week: 7 days    Minutes of Exercise per Session: 30 min  Stress: Stress Concern Present (03/09/2022)   [employer] of Occupational Health - Occupational Stress Questionnaire    Feeling of Stress : To some extent  Social Connections: Not on file  Intimate Partner Violence: Not At Risk (03/09/2022)   Humiliation, Afraid, Rape, and Kick questionnaire    Fear of Current or Ex-Partner: No    Emotionally Abused: No    Physically Abused: No    Sexually Abused: No     Constitutional: Pt reports fatigue, headaches. Denies fever, malaise, or abrupt weight changes.  HEENT: Pt reports sinus  pressure, runny nose. Denies eye pain, eye redness, ear pain, ringing in the ears, wax buildup, nasal congestion, bloody nose, or sore throat. Respiratory: Pt reports cough and shortness of breath. Denies difficulty breathing.   Cardiovascular: Denies chest pain, chest tightness, palpitations  or swelling in the hands or feet.  Gastrointestinal: Denies abdominal pain, bloating, constipation, diarrhea or blood in the stool.   No other specific complaints in a complete review of systems (except as listed in HPI above).  Objective:   Physical Exam BP 134/78 (BP Location: Left Arm, Patient Position: Sitting, Cuff Size: Normal)   Pulse 83   Temp (!) 96.8 F (36 C) (Temporal)   Wt 173 lb (78.5 kg)   SpO2 99%   BMI 30.65 kg/m   Wt Readings from Last 3 Encounters:  02/02/23 174 lb (78.9 kg)  12/01/22 179 lb (81.2 kg)  10/16/22 177 lb (80.3 kg)    General: Appears her stated age, appears unwell but in NAD. HEENT: Head: normal shape and size, no sinus tenderness noted; Eyes: sclera [place], no icterus, conjunctiva pink, PERRLA and EOMs intact; Nose: mucosa pink and moist, septum midline; Throat/Mouth: Teeth present, mucosa pink and moist, no exudate, lesions or ulcerations noted.  Neck:  No adenopathy noted. Cardiovascular: Normal rate and rhythm. S1,S2 noted.  No murmur, rubs or gallops noted.  Pulmonary/Chest: Normal effort and bilateral inspiratory and expiratory wheezing noted with scattered rhonchi throughout. No respiratory distress.  Neurological: Alert and oriented.     BMET    Component Value Date/Time   NA 143 09/09/2022 0943   K 4.0 09/09/2022 0943   K 3.8 12/15/2013 1202   CL 107 09/09/2022 0943   CO2 26 09/09/2022 0943   GLUCOSE 126 (H) 09/09/2022 0943   BUN 14 09/09/2022 0943   CREATININE 0.84 09/09/2022 0943   CALCIUM 8.9 09/09/2022 0943   GFRNONAA >60 01/19/2012 0949   GFRAA >60 01/19/2012 0949    Lipid Panel     Component Value Date/Time   CHOL 153 09/09/2022  0943   TRIG 93 09/09/2022 0943   HDL 57 09/09/2022 0943   CHOLHDL 2.7 09/09/2022 0943   LDLCALC 78 09/09/2022 0943    CBC    Component Value Date/Time   WBC 6.4 09/09/2022 0943   RBC 4.54 09/09/2022 0943   HGB 14.2 09/09/2022 0943   HCT 42.4 09/09/2022 0943   PLT 132 (L) 09/09/2022 0943   MCV 93.4 09/09/2022 0943   MCH 31.3 09/09/2022 0943   MCHC 33.5 09/09/2022 0943   RDW 12.4 09/09/2022 0943   LYMPHSABS 2,310 02/10/2022 1333   EOSABS 392 02/10/2022 1333   BASOSABS 91 02/10/2022 1333    Hgb A1C Lab Results  Component Value Date   HGBA1C 6.5 (H) 09/09/2022            Assessment & Plan:   COPD Exacerbation:  Encourage rest and fluids No indication for COVID testing given duration of symptoms Rx for Pred taper x 6 days Rx for Azithromycin x 5 days Continue Trelegy and Albuterol as previously prescribed Encourage smoking cessation   RTC in 1 month for follow-up of chronic conditions Nicki Reaper, NP

## 2023-02-16 NOTE — Patient Instructions (Signed)

## 2023-03-03 ENCOUNTER — Ambulatory Visit
Admission: RE | Admit: 2023-03-03 | Discharge: 2023-03-03 | Disposition: A | Discharge status: Home / Self Care | Payer: Medicare Other | Attending: Internal Medicine | Admitting: Internal Medicine

## 2023-03-03 ENCOUNTER — Ambulatory Visit
Admission: RE | Admit: 2023-03-03 | Discharge: 2023-03-03 | Disposition: A | Discharge status: Home / Self Care | Payer: Medicare Other | Source: Ambulatory Visit | Attending: Internal Medicine | Admitting: Internal Medicine

## 2023-03-03 ENCOUNTER — Ambulatory Visit (INDEPENDENT_AMBULATORY_CARE_PROVIDER_SITE_OTHER): Payer: Medicare Other | Admitting: Internal Medicine

## 2023-03-03 ENCOUNTER — Encounter: Payer: Self-pay | Admitting: Internal Medicine

## 2023-03-03 VITALS — BP 138/82 | HR 78 | Temp 96.6°F | Wt 172.0 lb

## 2023-03-03 DIAGNOSIS — T7840XD Allergy, unspecified, subsequent encounter: Secondary | ICD-10-CM

## 2023-03-03 DIAGNOSIS — J410 Simple chronic bronchitis: Secondary | ICD-10-CM | POA: Diagnosis not present

## 2023-03-03 DIAGNOSIS — R062 Wheezing: Secondary | ICD-10-CM | POA: Diagnosis not present

## 2023-03-03 DIAGNOSIS — R059 Cough, unspecified: Secondary | ICD-10-CM | POA: Diagnosis not present

## 2023-03-03 DIAGNOSIS — R0602 Shortness of breath: Secondary | ICD-10-CM | POA: Diagnosis not present

## 2023-03-03 NOTE — Patient Instructions (Signed)
Sore Throat When you have a sore throat, your throat may feel: Tender. Burning. Irritated. Scratchy. Painful when you swallow. Painful when you talk. Many things can cause a sore throat, such as: An infection. Allergies. Dry air. Smoke or pollution. Radiation treatment for cancer. Gastroesophageal reflux disease (GERD). A tumor. A sore throat can be the first sign of another sickness. It can happen with other problems, like: Coughing. Sneezing. Fever. Swelling of the glands in the neck. Most sore throats go away without treatment. Follow these instructions at home:     Medicines Take over-the-counter and prescription medicines only as told by your doctor. Children often get sore throats. Do not give your child aspirin. Use throat sprays to soothe your throat as told by your health care provider. Managing pain To help with pain: Sip warm liquids, such as broth, herbal tea, or warm water. Eat or drink cold or frozen liquids, such as frozen ice pops. Rinse your mouth (gargle) with a salt water mixture 3-4 times a day or as needed. To make salt water, dissolve -1 tsp (3-6 g) of salt in 1 cup (237 mL) of warm water. Do not swallow this mixture. Suck on hard candy or throat lozenges. Put a cool-mist humidifier in your bedroom at night. Sit in the bathroom with the door closed for 5-10 minutes while you run hot water in the shower. General instructions Do not smoke or use any products that contain nicotine or tobacco. If you need help quitting, ask your doctor. Get plenty of rest. Drink enough fluid to keep your pee (urine) pale yellow. Wash your hands often for at least 20 seconds with soap and water. If soap and water are not available, use hand sanitizer. Contact a doctor if: You have a fever for more than 2-3 days. You keep having symptoms for more than 2-3 days. Your throat does not get better in 7 days. You have a fever and your symptoms suddenly get worse. Your  child who is 3 months to 3 years old has a temperature of 102.2F (39C) or higher. Get help right away if: You have trouble breathing. You cannot swallow fluids, soft foods, or your spit. You have swelling in your throat or neck that gets worse. You feel like you may vomit (nauseous) and this feeling lasts a long time. You cannot stop vomiting. These symptoms may be an emergency. Get help right away. Call your local emergency services (911 in the [place] Do not wait to see if the symptoms will go away. Do not drive yourself to the hospital. Summary A sore throat is a painful, burning, irritated, or scratchy throat. Many things can cause a sore throat. Take over-the-counter medicines only as told by your doctor. Get plenty of rest. Drink enough fluid to keep your pee (urine) pale yellow. Contact a doctor if your symptoms get worse or your sore throat does not get better within 7 days. This information is not intended to replace advice given to you by your health care provider. Make sure you discuss any questions you have with your health care provider. Document Revised: 12/04/2020 Document Reviewed: 12/04/2020 Elsevier Patient Education  2024 [employer]  

## 2023-03-03 NOTE — Progress Notes (Signed)
HPI  Pt presents to the clinic today with c/o headache, runny nose, nasal congestion, sore throat, cough and shortness of breath. This started 2 weeks ago.   The headache is located on the left side of her head.  She is blowing clear mucus out of her nose.  She denies difficulty swallowing.  The cough is mostly nonproductive.  She reports that shortness of breath typically occurs mainly when she gets upset.  She denies ear pain, chest pain, nausea, vomiting or diarrhea.  She denies fever, chills or body aches.  She was seen 5/28 for the same and treated with prednisone and azithromycin for COPD exacerbation.  She reports she is felt some better after that but not completely.  She has a history of COPD managed on Albuterol.  She is not using Trelegy because it causes palpitations.  She does continue to smoke.  Review of Systems      Past Medical History:  Diagnosis Date   Allergy    B12 deficiency    Chest pain syndrome    COPD (chronic obstructive pulmonary disease) (HCC)    Depression    GERD (gastroesophageal reflux disease)    Glaucoma    History of colonic polyps    History of degenerative disc disease    c-spine, status post cervical laminectomy   Hyperlipidemia    Hypertension    Hypothyroidism    Insomnia    Migraine    Restless leg syndrome    Seasonal allergies    Sleep apnea    Thyroid cancer (HCC)    Urinary incontinence     Family History  Problem Relation Age of Onset   Heart disease Mother    Breast cancer Neg Hx     Social History   Socioeconomic History   Marital status: Widowed    Spouse name: Not on file   Number of children: Not on file   Years of education: Not on file   Highest education level: Not on file  Occupational History   Occupation: Retired  Tobacco Use   Smoking status: Every Day    Packs/day: 1.00    Years: 40.00    Additional pack years: 0.00    Total pack years: 40.00    Types: Cigarettes   Smokeless tobacco: Never  Vaping Use    Vaping Use: Never used  Substance and Sexual Activity   Alcohol use: No   Drug use: No   Sexual activity: Not on file  Other Topics Concern   Not on file  Social History Narrative   Not on file   Social Determinants of Health   Financial Resource Strain: Low Risk  (03/09/2022)   Overall Financial Resource Strain (CARDIA)    Difficulty of Paying Living Expenses: Not hard at all  Food Insecurity: No Food Insecurity (03/09/2022)   Hunger Vital Sign    Worried About Running Out of Food in the Last Year: Never true    Ran Out of Food in the Last Year: Never true  Transportation Needs: No Transportation Needs (03/09/2022)   PRAPARE - Administrator, Civil Service (Medical): No    Lack of Transportation (Non-Medical): No  Physical Activity: Sufficiently Active (03/09/2022)   Exercise Vital Sign    Days of Exercise per Week: 7 days    Minutes of Exercise per Session: 30 min  Stress: Stress Concern Present (03/09/2022)   [employer] of Occupational Health - Occupational Stress Questionnaire    Feeling of Stress :  To some extent  Social Connections: Not on file  Intimate Partner Violence: Not At Risk (03/09/2022)   Humiliation, Afraid, Rape, and Kick questionnaire    Fear of Current or Ex-Partner: No    Emotionally Abused: No    Physically Abused: No    Sexually Abused: No    Allergies  Allergen Reactions   Toviaz [Fesoterodine Fumarate Er] Other (See Comments)    Throat closed   Cortisone    Doxycycline Hyclate    Hydrocodone    Keflex [Cephalexin]    Septra [Sulfamethoxazole-Trimethoprim]    Tagamet [Cimetidine]    Zoloft [Sertraline]    Penicillins Rash     Constitutional: Positive headache, fatigue. Denies fever or abrupt weight changes.  HEENT:  Positive runny nose, nasal congestion and sore throat. Denies eye redness, eye pain, pressure behind the eyes, facial pain, ear pain, ringing in the ears, wax buildup, or bloody nose. Respiratory: Positive  cough and shortness of breath. Denies difficulty breathing.  Cardiovascular: Denies chest pain, chest tightness, palpitations or swelling in the hands or feet.   No other specific complaints in a complete review of systems (except as listed in HPI above).  Objective:  BP 138/82 (BP Location: Left Arm, Patient Position: Sitting, Cuff Size: Normal)   Pulse 78   Temp (!) 96.6 F (35.9 C) (Temporal)   Wt 172 lb (78 kg)   SpO2 99%   BMI 30.47 kg/m   Wt Readings from Last 3 Encounters:  02/16/23 173 lb (78.5 kg)  02/02/23 174 lb (78.9 kg)  12/01/22 179 lb (81.2 kg)     General: Appears her stated age, obese, in NAD. HEENT: Head: normal shape and size, no sinus tenderness noted; Eyes: sclera Mcanany, no icterus, conjunctiva pink; Ears: Tm's gray and intact, normal light reflex; Nose: mucosa pink and moist, septum midline; Throat/Mouth: + PND. Teeth present, mucosa erythematous and moist, no exudate noted, no lesions or ulcerations noted.  Neck: No cervical lymphadenopathy.  Cardiovascular: Normal rate and rhythm. S1,S2 noted.  No murmur, rubs or gallops noted.  Pulmonary/Chest: Normal effort and positive vesicular breath sounds. No respiratory distress. No wheezes, rales or ronchi noted.       Assessment & Plan:   COPD, Allergies:  Get some rest and drink plenty of water Do salt water gargles for the sore throat Advised her to switch Zyrtec to Allegra No indication for additional antibiotics or steroids at this time Chest x-ray to rule out pneumonia per her request Consider trying Breztri as Trelegy caused palpitations Strongly encouraged smoking cessation Referral to pulmonology for further evaluation of her COPD Continue Albuterol as needed  RTC as needed or if symptoms persist.   Nicki Reaper, NP

## 2023-03-04 ENCOUNTER — Other Ambulatory Visit: Payer: Self-pay | Admitting: Internal Medicine

## 2023-03-11 ENCOUNTER — Ambulatory Visit: Payer: Medicare Other | Admitting: Internal Medicine

## 2023-03-31 ENCOUNTER — Other Ambulatory Visit: Payer: Self-pay | Admitting: Internal Medicine

## 2023-03-31 DIAGNOSIS — J449 Chronic obstructive pulmonary disease, unspecified: Secondary | ICD-10-CM

## 2023-04-01 ENCOUNTER — Other Ambulatory Visit: Payer: Self-pay | Admitting: Internal Medicine

## 2023-04-01 NOTE — Telephone Encounter (Signed)
Requested Prescriptions  Pending Prescriptions Disp Refills   omeprazole (PRILOSEC) 20 MG capsule [Pharmacy Med Name: Omeprazole 20 MG Oral Capsule Delayed Release] 100 capsule 1    Sig: TAKE 1 CAPSULE BY MOUTH DAILY     Gastroenterology: Proton Pump Inhibitors Passed - 04/01/2023  4:30 AM      Passed - Valid encounter within last 12 months    Recent Outpatient Visits           4 weeks ago Simple chronic bronchitis [facility])   [place] [facility] [place], Salvadore Oxford, NP   1 month ago COPD exacerbation [facility])   [place] [facility] [place], Salvadore Oxford, NP   5 months ago Pharyngitis, unspecified etiology   [place] [facility] [place], Salvadore Oxford, NP   5 months ago COPD exacerbation [facility])   [place] [facility] [place], Salvadore Oxford, NP   6 months ago Encounter for general adult medical examination with abnormal findings   [place] [facility] [place], Salvadore Oxford, NP       Future Appointments             In 4 months McGowan, Elana Alm [facility] U[facility]

## 2023-04-01 NOTE — Telephone Encounter (Signed)
Requested Prescriptions  Pending Prescriptions Disp Refills   albuterol (VENTOLIN HFA) 108 (90 Base) MCG/ACT inhaler [Pharmacy Med Name: ALBUTEROL HFA 90MCG/ACT (PA)] 34 g 1    Sig: USE 1 TO 2 INHALATIONS BY MOUTH  INTO THE LUNGS EVERY 6 HOURS AS  NEEDED FOR WHEEZING OR SHORTNESS OF BREATH     Pulmonology:  Beta Agonists 2 Passed - 03/31/2023 10:49 PM      Passed - Last BP in normal range    BP Readings from Last 1 Encounters:  03/03/23 138/82         Passed - Last Heart Rate in normal range    Pulse Readings from Last 1 Encounters:  03/03/23 78         Passed - Valid encounter within last 12 months    Recent Outpatient Visits           4 weeks ago Simple chronic bronch[facility])   [facility] [place], Salvadore Oxford, NP   1 month ago COPD exacerbation [facility])   [place] [facility] [place], Salvadore Oxford, NP   5 months ago Pharyngitis, unspecified etiology   [place] [facility] [place], Salvadore Oxford, NP   5 months ago COPD exacerbation [facility])   [place] [facility] [place], Salvadore Oxford, NP   6 months ago Encounter for general adult medical examination with abnormal findings   [place] [facility] [place], Salvadore Oxford, NP       Future Appointments             In 4 months McGowan, Elana Alm [facility] Urology [facility]

## 2023-04-02 ENCOUNTER — Other Ambulatory Visit: Payer: Self-pay | Admitting: Internal Medicine

## 2023-04-02 DIAGNOSIS — E785 Hyperlipidemia, unspecified: Secondary | ICD-10-CM

## 2023-04-02 NOTE — Telephone Encounter (Signed)
Copied from CRM [id number]. Topic: General - Other >> Apr 02, 2023  9:40 AM Everette C wrote: Reason for CRM: Medication Refill - Medication: valsartan (DIOVAN) 80 MG tablet [[id number]] - patient has 0 tablets remaining   The patient shares that they are requesting a modified prescription of roughly 10 tablets to last them until their prescription is mailed to them   Has the patient contacted their pharmacy? Yes.   (Agent: If no, request that the patient contact the pharmacy for the refill. If patient does not wish to contact the pharmacy document the reason why and proceed with request.) (Agent: If yes, when and what did the pharmacy advise?)  Preferred Pharmacy (with phone number or street name): [facility] [place] [place] - [address] [place] [address] Phone: [phone] Fax: [phone] Hours: Not open 24 hours   Has the patient been seen for an appointment in the last year OR does the patient have an upcoming appointment? Yes.    Agent: Please be advised that RX refills may take up to 3 business days. We ask that you follow-up with your pharmacy.

## 2023-04-02 NOTE — Telephone Encounter (Signed)
Requested medication (s) are due for refill today:YES  Requested medication (s) are on the active medication list: YES Last refill:  08/03/22 #90 1 RF  Future visit scheduled: no  Notes to clinic:  overdue lab work    Requested Prescriptions  Pending Prescriptions Disp Refills   valsartan (DIOVAN) 80 MG tablet 90 tablet 1    Sig: Take 1 tablet (80 mg total) by mouth daily.     Cardiovascular:  Angiotensin Receptor Blockers Failed - 04/02/2023 11:19 AM      Failed - Cr in normal range and within 180 days    Creat  Date Value Ref Range Status  09/09/2022 0.84 0.60 - 0.95 mg/dL Final   Creatinine, Urine  Date Value Ref Range Status  09/09/2022 57 20 - 275 mg/dL Final         Failed - K in normal range and within 180 days    Potassium  Date Value Ref Range Status  09/09/2022 4.0 3.5 - 5.3 mmol/L Final  12/15/2013 3.8 3.5 - 5.1 mmol/L Final         Passed - Patient is not pregnant      Passed - Last BP in normal range    BP Readings from Last 1 Encounters:  03/03/23 138/82         Passed - Valid encounter within last 6 months    Recent Outpatient Visits           1 month ago Simple chronic bronchitis [facility])   [place] [facility] [place], Salvadore Oxford, NP   1 month ago COPD exacerbation [facility])   [place] [facility] [place], Salvadore Oxford, NP   5 months ago Pharyngitis, unspecified etiology   [place] [facility] [place], Salvadore Oxford, NP   5 months ago COPD exacerbation [facility])   [place] [facility] [place], Salvadore Oxford, NP   6 months ago Encounter for general adult medical examination with abnormal findings    [facility] [place], Salvadore Oxford, NP       Future Appointments             In 4 months McGowan, Elana Alm [facility]

## 2023-04-02 NOTE — Telephone Encounter (Signed)
The patient shares that they are requesting a modified prescription of roughly 10 tablets to last them until their prescription is mailed to them.

## 2023-04-02 NOTE — Telephone Encounter (Signed)
Requested Prescriptions  Pending Prescriptions Disp Refills   atorvastatin (LIPITOR) 10 MG tablet [Pharmacy Med Name: Atorvastatin Calcium 10 MG Oral Tablet] 90 tablet 0    Sig: TAKE 1 TABLET BY MOUTH DAILY     Cardiovascular:  Antilipid - Statins Failed - 04/02/2023  9:35 AM      Failed - Lipid Panel in normal range within the last 12 months    Cholesterol  Date Value Ref Range Status  09/09/2022 153 <200 mg/dL Final   LDL Cholesterol (Calc)  Date Value Ref Range Status  09/09/2022 78 mg/dL (calc) Final    Comment:    Reference range: <100 . Desirable range <100 mg/dL for primary prevention;   <70 mg/dL for patients with CHD or diabetic patients  with > or = 2 CHD risk factors. Marland Kitchen LDL-C is now calculated using the Martin-Hopkins  calculation, which is a validated novel method providing  better accuracy than the Friedewald equation in the  estimation of LDL-C.  Horald Pollen et al. Lenox Ahr. 1610;960(45): 2061-2068  (http://education.[employer].com/faq/FAQ164)    HDL  Date Value Ref Range Status  09/09/2022 57 > OR = 50 mg/dL Final   Triglycerides  Date Value Ref Range Status  09/09/2022 93 <150 mg/dL Final         Passed - Patient is not pregnant      Passed - Valid encounter within last 12 months    Recent Outpatient Visits           1 month ago Simple chronic bronchitis [facility])   [place] [facility] [place], Salvadore Oxford, NP   1 month ago COPD exacerbation [facility])   [place] [facility] [place], Salvadore Oxford, NP   5 months ago Pharyngitis, unspecified etiology   [place] [facility] [place], Salvadore Oxford, NP   5 months ago COPD exacerbation [facility])   [place] [facility] [place], Salvadore Oxford, NP   6 months ago Encounter for general adult medical examination with abnormal findings    [facility] [place], Salvadore Oxford, NP       Future Appointments             In 4 months McGowan, Elana Alm [facility]

## 2023-04-06 ENCOUNTER — Encounter: Payer: Self-pay | Admitting: Student in an Organized Health Care Education/Training Program

## 2023-04-06 ENCOUNTER — Ambulatory Visit: Payer: Medicare Other | Admitting: Student in an Organized Health Care Education/Training Program

## 2023-04-06 VITALS — BP 124/68 | HR 75 | Temp 98.0°F | Ht 63.0 in | Wt 171.8 lb

## 2023-04-06 DIAGNOSIS — J410 Simple chronic bronchitis: Secondary | ICD-10-CM

## 2023-04-06 DIAGNOSIS — R0602 Shortness of breath: Secondary | ICD-10-CM | POA: Diagnosis not present

## 2023-04-06 MED ORDER — ANORO ELLIPTA 62.5-25 MCG/ACT IN AEPB
1.0000 | INHALATION_SPRAY | Freq: Every day | RESPIRATORY_TRACT | 11 refills | Status: DC
Start: 2023-04-06 — End: 2024-05-02

## 2023-04-06 NOTE — Progress Notes (Signed)
Synopsis: Referred in for chronic bronchitis by Lorre Munroe, NP  Assessment & Plan:   1. Shortness of breath 2. Simple chronic bronchitis (HCC)  Presenting for the evaluation of increased shortness of breath, cough, and sputum production consistent with a diagnosis of chronic bronchitis.  Patient did recently receive a course of steroids and antibiotics for the management of a COPD exacerbation with improvement.  She did have a chest x-ray that showed signs of bronchitis but otherwise was unremarkable.  Patient is reporting a burden with her multiple physician appointments and would like to minimize further testing. Given this, I will hold off on obtaining pulmonary function testing at this moment as it would not change management at this point. I did review previous chest imaging (CT) that did not show signs of bronchiectasis or early ILD and is rather suggestive of chronic bronchitis. She is not using her longstanding inhalers secondary to side effects from Trelegy (thrush, despite mouth washing) and I will reinitiate LAMA/LABA with Anoro Ellipta and sidestepping the inhaled corticosteroid component.  While she is an active smoker, she is outside the window for lung cancer screening and I will hold off on obtaining any chest imaging at this point. I did counsel her today on the importance of smoking cessation and patient doesn't appear ready to quit.  - umeclidinium-vilanterol (ANORO ELLIPTA) 62.5-25 MCG/ACT AEPB; Inhale 1 puff into the lungs daily.  Dispense: 30 each; Refill: 11   Return in about 1 year (around 04/05/2024).  I spent 45 minutes caring for this patient today, including preparing to see the patient, obtaining a medical history , reviewing a separately obtained history, performing a medically appropriate examination and/or evaluation, counseling and educating the patient/family/caregiver, ordering medications, tests, or procedures, documenting clinical information in the  electronic health record, and independently interpreting results (not separately reported/billed) and communicating results to the patient/family/caregiver. 4 minutes spent on smoking cessation counseling.  Raechel Chute, MD [facility] 04/06/2023 10:49 AM    End of visit medications:  Meds ordered this encounter  Medications   umeclidinium-vilanterol (ANORO ELLIPTA) 62.5-25 MCG/ACT AEPB    Sig: Inhale 1 puff into the lungs daily.    Dispense:  30 each    Refill:  11     Current Outpatient Medications:    acetaminophen (TYLENOL) 500 MG tablet, Take 500 mg by mouth every 6 (six) hours as needed., Disp: , Rfl:    albuterol (VENTOLIN HFA) 108 (90 Base) MCG/ACT inhaler, USE 1 TO 2 INHALATIONS BY MOUTH  INTO THE LUNGS EVERY 6 HOURS AS  NEEDED FOR WHEEZING OR SHORTNESS OF BREATH, Disp: 34 g, Rfl: 1   atorvastatin (LIPITOR) 10 MG tablet, TAKE 1 TABLET BY MOUTH DAILY, Disp: 90 tablet, Rfl: 0   Calcium Carb-Cholecalciferol (CALCIUM + D3) 600-200 MG-UNIT TABS, Take by mouth., Disp: , Rfl:    cetirizine (ZYRTEC) 10 MG tablet, Take 10 mg by mouth daily., Disp: , Rfl:    Cranberry 180 MG CAPS, Take by mouth., Disp: , Rfl:    hydrOXYzine (ATARAX/VISTARIL) 10 MG tablet, Take 1 tablet (10 mg total) by mouth daily as needed., Disp: 30 tablet, Rfl: 0   latanoprost (XALATAN) 0.005 % ophthalmic solution, Place 1 drop into both eyes at bedtime., Disp: , Rfl:    mirabegron ER (MYRBETRIQ) 50 MG TB24 tablet, Take 1 tablet (50 mg total) by mouth daily., Disp: 90 tablet, Rfl: 3   montelukast (SINGULAIR) 10 MG tablet, TAKE 1 TABLET BY MOUTH AT  BEDTIME,  Disp: 90 tablet, Rfl: 2   omeprazole (PRILOSEC) 20 MG capsule, TAKE 1 CAPSULE BY MOUTH DAILY, Disp: 100 capsule, Rfl: 1   SYNTHROID 112 MCG tablet, TAKE 1 TABLET BY MOUTH DAILY  BEFORE BREAKFAST, Disp: 90 tablet, Rfl: 3   traZODone (DESYREL) 50 MG tablet, Take 0.5 tablets (25 mg total) by mouth at bedtime as needed for sleep., Disp: 45 tablet,  Rfl: 0   triamcinolone cream (KENALOG) 0.1 %, Apply 1 application. topically 2 (two) times daily., Disp: 30 g, Rfl: 0   umeclidinium-vilanterol (ANORO ELLIPTA) 62.5-25 MCG/ACT AEPB, Inhale 1 puff into the lungs daily., Disp: 30 each, Rfl: 11   valsartan (DIOVAN) 80 MG tablet, TAKE 1 TABLET BY MOUTH DAILY, Disp: 90 tablet, Rfl: 1   vitamin B-12 (CYANOCOBALAMIN) 1000 MCG tablet, Take 1,000 mcg by mouth daily., Disp: , Rfl:    Subjective:   PATIENT ID: Brooke King GENDER: female DOB: [birth year], MRN: [id number]  Chief Complaint  Patient presents with   puolmonary consult    Hx of COPD. SOB with exertion, prod cough with with clear to yellow sputum mainly in the morning after using cpap and occ wheezing.     HPI  Patient is a pleasant 82 year old female presenting to clinic for the evaluation of COPD and chronic bronchitis.  Patient's symptoms have been chronic and ongoing for many years.  She reports a chronic cough that is productive of yellowish sputum that mostly happens in the morning when she wakes up.  She does have occasional wheezing and occasional shortness of breath with exertion. Patient does report significant burden from her anxiety and feels that some of her inhalers cause her to be a lot more anxious.  She has not had any fevers, chills, night sweats, or weight loss.  Patient is followed closely by her PCP and was maintained on Trelegy which she reports caused her to have a thrush as well as palpitations so she stopped using it.  PCP prescribed Breztri but it does not appear that the patient has used it.  She was seen by PCP in May for symptoms of increased shortness of breath and cough for which she required a course of prednisone and antibiotics with improvement. She has now finished this.  She was prescribed montelukast which she takes regularly. She has a history of OSA maintained on CPAP with which she is compliant. She does report nasal stuffiness in the morning secondary to  CPAP use but is overall reporting compliance with it.  Patient has a longstanding history of smoking, having smoked a pack a day for over 60 years.  She previously worked in [employer].  Patient continues to smoke 1 pack daily.  She has 1 pet dog (toy poodle).  Ancillary information including prior medications, full medical/surgical/family/social histories, and PFTs (when available) are listed below and have been reviewed.   Review of Systems  Constitutional:  Negative for chills, fever, malaise/fatigue and weight loss.  Respiratory:  Positive for cough, sputum production, shortness of breath and wheezing. Negative for hemoptysis.   Cardiovascular:  Negative for chest pain.     Objective:   Vitals:   04/06/23 1026  BP: 124/68  Pulse: 75  Temp: 98 F (36.7 C)  TempSrc: Temporal  SpO2: 96%  Weight: 171 lb 12.8 oz (77.9 kg)  Height: 5\' 3"  (1.6 m)   96% on RA BMI Readings from Last 3 Encounters:  04/06/23 30.43 kg/m  03/03/23 30.47 kg/m  02/16/23 30.65 kg/m   Wt Readings  from Last 3 Encounters:  04/06/23 171 lb 12.8 oz (77.9 kg)  03/03/23 172 lb (78 kg)  02/16/23 173 lb (78.5 kg)    Physical Exam Constitutional:      Appearance: Normal appearance. She is obese. She is not ill-appearing.  HENT:     Head: Normocephalic.     Mouth/Throat:     Mouth: Mucous membranes are moist.  Cardiovascular:     Rate and Rhythm: Normal rate and regular rhythm.     Pulses: Normal pulses.     Heart sounds: Normal heart sounds.  Pulmonary:     Effort: Pulmonary effort is normal. No respiratory distress.     Breath sounds: Normal breath sounds. No wheezing or rales.  Neurological:     General: No focal deficit present.     Mental Status: She is alert and oriented to person, place, and time. Mental status is at baseline.  Psychiatric:        Mood and Affect: Mood is anxious.       Ancillary Information    Past Medical History:  Diagnosis Date   Allergy    B12 deficiency     Chest pain syndrome    COPD (chronic obstructive pulmonary disease) (HCC)    Depression    GERD (gastroesophageal reflux disease)    Glaucoma    History of colonic polyps    History of degenerative disc disease    c-spine, status post cervical laminectomy   Hyperlipidemia    Hypertension    Hypothyroidism    Insomnia    Migraine    Restless leg syndrome    Seasonal allergies    Sleep apnea    Thyroid cancer (HCC)    Urinary incontinence      Family History  Problem Relation Age of Onset   Heart disease Mother    Breast cancer Neg Hx      Past Surgical History:  Procedure Laterality Date   ABDOMINAL HYSTERECTOMY     CATARACT EXTRACTION, BILATERAL     CERVICAL LAMINECTOMY  08/2004   CHOLECYSTECTOMY     COLONOSCOPY WITH PROPOFOL N/A 11/18/2015   Procedure: COLONOSCOPY WITH PROPOFOL;  Surgeon: Wallace Cullens, MD;  Location: [facility] ENDOSCOPY;  Service: Gastroenterology;  Laterality: N/A;   ESOPHAGOGASTRODUODENOSCOPY (EGD) WITH PROPOFOL N/A 11/18/2015   Procedure: ESOPHAGOGASTRODUODENOSCOPY (EGD) WITH PROPOFOL;  Surgeon: Wallace Cullens, MD;  Location: [facility] ENDOSCOPY;  Service: Gastroenterology;  Laterality: N/A;   HEMORRHOID SURGERY     TUBAL LIGATION      Social History   Socioeconomic History   Marital status: Widowed    Spouse name: Not on file   Number of children: Not on file   Years of education: Not on file   Highest education level: Not on file  Occupational History   Occupation: Retired  Tobacco Use   Smoking status: Every Day    Current packs/day: 1.00    Average packs/day: 1 pack/day for 102.5 years (102.5 ttl pk-yrs)    Types: Cigarettes    Start date: 1962   Smokeless tobacco: Never  Vaping Use   Vaping status: Never Used  Substance and Sexual Activity   Alcohol use: No   Drug use: No   Sexual activity: Not on file  Other Topics Concern   Not on file  Social History Narrative   Not on file   Social Determinants of Health   Financial Resource Strain:  Low Risk  (03/09/2022)   Overall Financial Resource Strain (CARDIA)  Difficulty of Paying Living Expenses: Not hard at all  Food Insecurity: No Food Insecurity (03/09/2022)   Hunger Vital Sign    Worried About Running Out of Food in the Last Year: Never true    Ran Out of Food in the Last Year: Never true  Transportation Needs: No Transportation Needs (03/09/2022)   PRAPARE - Administrator, Civil Service (Medical): No    Lack of Transportation (Non-Medical): No  Physical Activity: Sufficiently Active (03/09/2022)   Exercise Vital Sign    Days of Exercise per Week: 7 days    Minutes of Exercise per Session: 30 min  Stress: Stress Concern Present (03/09/2022)   [employer] of Occupational Health - Occupational Stress Questionnaire    Feeling of Stress : To some extent  Social Connections: Not on file  Intimate Partner Violence: Not At Risk (03/09/2022)   Humiliation, Afraid, Rape, and Kick questionnaire    Fear of Current or Ex-Partner: No    Emotionally Abused: No    Physically Abused: No    Sexually Abused: No     Allergies  Allergen Reactions   Toviaz [Fesoterodine Fumarate Er] Other (See Comments)    Throat closed   Cortisone    Doxycycline Hyclate    Hydrocodone    Keflex [Cephalexin]    Septra [Sulfamethoxazole-Trimethoprim]    Tagamet [Cimetidine]    Zoloft [Sertraline]    Penicillins Rash     CBC    Component Value Date/Time   WBC 6.4 09/09/2022 0943   RBC 4.54 09/09/2022 0943   HGB 14.2 09/09/2022 0943   HCT 42.4 09/09/2022 0943   PLT 132 (L) 09/09/2022 0943   MCV 93.4 09/09/2022 0943   MCH 31.3 09/09/2022 0943   MCHC 33.5 09/09/2022 0943   RDW 12.4 09/09/2022 0943   LYMPHSABS 2,310 02/10/2022 1333   EOSABS 392 02/10/2022 1333   BASOSABS 91 02/10/2022 1333    Pulmonary Functions Testing Results:     No data to display          Outpatient Medications Prior to Visit  Medication Sig Dispense Refill   acetaminophen (TYLENOL) 500 MG  tablet Take 500 mg by mouth every 6 (six) hours as needed.     albuterol (VENTOLIN HFA) 108 (90 Base) MCG/ACT inhaler USE 1 TO 2 INHALATIONS BY MOUTH  INTO THE LUNGS EVERY 6 HOURS AS  NEEDED FOR WHEEZING OR SHORTNESS OF BREATH 34 g 1   atorvastatin (LIPITOR) 10 MG tablet TAKE 1 TABLET BY MOUTH DAILY 90 tablet 0   Calcium Carb-Cholecalciferol (CALCIUM + D3) 600-200 MG-UNIT TABS Take by mouth.     cetirizine (ZYRTEC) 10 MG tablet Take 10 mg by mouth daily.     Cranberry 180 MG CAPS Take by mouth.     hydrOXYzine (ATARAX/VISTARIL) 10 MG tablet Take 1 tablet (10 mg total) by mouth daily as needed. 30 tablet 0   latanoprost (XALATAN) 0.005 % ophthalmic solution Place 1 drop into both eyes at bedtime.     mirabegron ER (MYRBETRIQ) 50 MG TB24 tablet Take 1 tablet (50 mg total) by mouth daily. 90 tablet 3   montelukast (SINGULAIR) 10 MG tablet TAKE 1 TABLET BY MOUTH AT  BEDTIME 90 tablet 2   omeprazole (PRILOSEC) 20 MG capsule TAKE 1 CAPSULE BY MOUTH DAILY 100 capsule 1   SYNTHROID 112 MCG tablet TAKE 1 TABLET BY MOUTH DAILY  BEFORE BREAKFAST 90 tablet 3   traZODone (DESYREL) 50 MG tablet Take 0.5 tablets (25 mg total)  by mouth at bedtime as needed for sleep. 45 tablet 0   triamcinolone cream (KENALOG) 0.1 % Apply 1 application. topically 2 (two) times daily. 30 g 0   valsartan (DIOVAN) 80 MG tablet TAKE 1 TABLET BY MOUTH DAILY 90 tablet 1   vitamin B-12 (CYANOCOBALAMIN) 1000 MCG tablet Take 1,000 mcg by mouth daily.     No facility-administered medications prior to visit.

## 2023-04-09 ENCOUNTER — Ambulatory Visit (INDEPENDENT_AMBULATORY_CARE_PROVIDER_SITE_OTHER): Payer: Medicare Other

## 2023-04-09 VITALS — Ht 63.0 in | Wt 171.0 lb

## 2023-04-09 DIAGNOSIS — Z0000 Encounter for general adult medical examination without abnormal findings: Secondary | ICD-10-CM

## 2023-04-09 NOTE — Progress Notes (Signed)
Subjective:   Brooke King is a 82 y.o. female who presents for Medicare Annual (Subsequent) preventive examination.  Per patient no change in vitals since last visit, unable to obtain new vitals due to telehealth visit   Visit Complete: Virtual  I connected with  Brooke King on 04/09/23 by a audio enabled telemedicine application and verified that I am speaking with the correct person using two identifiers.  Patient Location: Home  Provider Location: Office/Clinic  I discussed the limitations of evaluation and management by telemedicine. The patient expressed understanding and agreed to proceed.   Review of Systems     Cardiac Risk Factors include: advanced age (>29men, >6 women);dyslipidemia;hypertension;obesity (BMI >30kg/m2)     Objective:    Today's Vitals   04/09/23 1540  Weight: 171 lb (77.6 kg)  Height: 5\' 3"  (1.6 m)   Body mass index is 30.29 kg/m.     04/09/2023    3:30 PM 11/18/2015   10:08 AM  Advanced Directives  Does Patient Have a Medical Advance Directive? No Yes  Would patient like information on creating a medical advance directive? No - Patient declined     Current Medications (verified) Outpatient Encounter Medications as of 04/09/2023  Medication Sig   acetaminophen (TYLENOL) 500 MG tablet Take 500 mg by mouth every 6 (six) hours as needed.   albuterol (VENTOLIN HFA) 108 (90 Base) MCG/ACT inhaler USE 1 TO 2 INHALATIONS BY MOUTH  INTO THE LUNGS EVERY 6 HOURS AS  NEEDED FOR WHEEZING OR SHORTNESS OF BREATH   atorvastatin (LIPITOR) 10 MG tablet TAKE 1 TABLET BY MOUTH DAILY   Calcium Carb-Cholecalciferol (CALCIUM + D3) 600-200 MG-UNIT TABS Take by mouth.   cetirizine (ZYRTEC) 10 MG tablet Take 10 mg by mouth daily.   Cranberry 180 MG CAPS Take by mouth.   hydrOXYzine (ATARAX/VISTARIL) 10 MG tablet Take 1 tablet (10 mg total) by mouth daily as needed.   latanoprost (XALATAN) 0.005 % ophthalmic solution Place 1 drop into both eyes at bedtime.    mirabegron ER (MYRBETRIQ) 50 MG TB24 tablet Take 1 tablet (50 mg total) by mouth daily.   montelukast (SINGULAIR) 10 MG tablet TAKE 1 TABLET BY MOUTH AT  BEDTIME   omeprazole (PRILOSEC) 20 MG capsule TAKE 1 CAPSULE BY MOUTH DAILY   SYNTHROID 112 MCG tablet TAKE 1 TABLET BY MOUTH DAILY  BEFORE BREAKFAST   traZODone (DESYREL) 50 MG tablet Take 0.5 tablets (25 mg total) by mouth at bedtime as needed for sleep.   triamcinolone cream (KENALOG) 0.1 % Apply 1 application. topically 2 (two) times daily.   umeclidinium-vilanterol (ANORO ELLIPTA) 62.5-25 MCG/ACT AEPB Inhale 1 puff into the lungs daily.   valsartan (DIOVAN) 80 MG tablet TAKE 1 TABLET BY MOUTH DAILY   vitamin B-12 (CYANOCOBALAMIN) 1000 MCG tablet Take 1,000 mcg by mouth daily.   No facility-administered encounter medications on file as of 04/09/2023.    Allergies (verified) Toviaz [fesoterodine fumarate er], Cortisone, Doxycycline hyclate, Hydrocodone, Keflex [cephalexin], Septra [sulfamethoxazole-trimethoprim], Tagamet [cimetidine], Zoloft [sertraline], and Penicillins   History: Past Medical History:  Diagnosis Date   Allergy    B12 deficiency    Chest pain syndrome    COPD (chronic obstructive pulmonary disease) (HCC)    Depression    GERD (gastroesophageal reflux disease)    Glaucoma    History of colonic polyps    History of degenerative disc disease    c-spine, status post cervical laminectomy   Hyperlipidemia    Hypertension  Hypothyroidism    Insomnia    Migraine    Restless leg syndrome    Seasonal allergies    Sleep apnea    Thyroid cancer [facility])    Urinary incontinence    Past Surgical History:  Procedure Laterality Date   ABDOMINAL HYSTERECTOMY     CATARACT EXTRACTION, BILATERAL     CERVICAL LAMINECTOMY  08/2004   CHOLECYSTECTOMY     COLONOSCOPY WITH PROPOFOL N/A 11/18/2015   Procedure: COLONOSCOPY WITH PROPOFOL;  Surgeon: Wallace Cullens, MD;  Location[facility] ENDOSCOPY;  Service: Gastroenterology;  Laterality:  N/A;   ESOPHAGOGASTRODUODENOSCOPY (EGD) WITH PROPOFOL N/A 11/18/2015   Procedure: ESOPHAGOGASTRODUODENOSCOPY (EGD) WITH PROPOFOL;  Surgeon: Wallace Cullens, MD;  Location: [facility] ENDOSCOPY;  Service: Gastroenterology;  Laterality: N/A;   HEMORRHOID SURGERY     TUBAL LIGATION     Family History  Problem Relation Age of Onset   Heart disease Mother    Breast cancer Neg Hx    Social History   Socioeconomic History   Marital status: Widowed    Spouse name: Not on file   Number of children: Not on file   Years of education: Not on file   Highest education level: Not on file  Occupational History   Occupation: Retired  Tobacco Use   Smoking status: Every Day    Current packs/day: 1.00    Average packs/day: 1 pack/day for 102.5 years (102.5 ttl pk-yrs)    Types: Cigarettes    Start date: 1962   Smokeless tobacco: Never  Vaping Use   Vaping status: Never Used  Substance and Sexual Activity   Alcohol use: No   Drug use: No   Sexual activity: Not on file  Other Topics Concern   Not on file  Social History Narrative   Not on file   Social Determinants of Health   Financial Resource Strain: Low Risk  (04/09/2023)   Overall Financial Resource Strain (CARDIA)    Difficulty of Paying Living Expenses: Not very hard  Food Insecurity: No Food Insecurity (04/09/2023)   Hunger Vital Sign    Worried About Running Out of Food in the Last Year: Never true    Ran Out of Food in the Last Year: Never true  Transportation Needs: No Transportation Needs (04/09/2023)   PRAPARE - Administrator, Civil Service (Medical): No    Lack of Transportation (Non-Medical): No  Physical Activity: Insufficiently Active (04/09/2023)   Exercise Vital Sign    Days of Exercise per Week: 7 days    Minutes of Exercise per Session: 10 min  Stress: No Stress Concern Present (04/09/2023)   [employer] of Occupational Health - Occupational Stress Questionnaire    Feeling of Stress : Only a little  Social  Connections: Socially Isolated (04/09/2023)   Social Connection and Isolation Panel [NHANES]    Frequency of Communication with Friends and Family: More than three times a week    Frequency of Social Gatherings with Friends and Family: More than three times a week    Attends Religious Services: Never    Database administrator or Organizations: No    Attends Banker Meetings: Never    Marital Status: Widowed    Tobacco Counseling Ready to quit: Not Answered Counseling given: Not Answered   Clinical Intake:  Pre-visit preparation completed: Yes  Pain : No/denies pain     Nutritional Risks: None Diabetes: No  How often do you need to have someone help you when  you read instructions, pamphlets, or other written materials from your doctor or pharmacy?: 1 - Never  Interpreter Needed?: No  Information entered by :: Kennedy Bucker, LPN   Activities of Daily Living    04/09/2023    3:31 PM 02/16/2023   11:17 AM  In your present state of health, do you have any difficulty performing the following activities:  Hearing? 0 0  Vision? 0 0  Difficulty concentrating or making decisions? 0 0  Walking or climbing stairs? 0 0  Dressing or bathing? 0 0  Doing errands, shopping? 0 0  Preparing Food and eating ? N   Using the Toilet? N   In the past six months, have you accidently leaked urine? N   Do you have problems with loss of bowel control? N   Managing your Medications? N   Managing your Finances? N   Housekeeping or managing your Housekeeping? N     Patient Care Team: Lorre Munroe, NP as PCP - General (Internal Medicine)  Indicate any recent Medical Services you may have received from other than [facility] providers in the past year (date may be approximate).     Assessment:   This is a routine wellness examination for Starlet.  Hearing/Vision screen Hearing Screening - Comments:: No aids Vision Screening - Comments:: Readers- Dr. Inez Pilgrim  Dietary issues and  exercise activities discussed:     Goals Addressed             This Visit's Progress    DIET - EAT MORE FRUITS AND VEGETABLES         Depression Screen    04/09/2023    3:29 PM 02/16/2023   11:17 AM 10/08/2022   11:43 AM 09/09/2022    9:43 AM 03/09/2022   10:06 AM 02/06/2022    1:26 PM 10/07/2021    8:43 AM  PHQ 2/9 Scores  PHQ - 2 Score 0 1 0 0 1 0 0  PHQ- 9 Score 0    7 1 1     Fall Risk    04/09/2023    3:31 PM 02/16/2023   11:17 AM 10/08/2022   11:43 AM 09/09/2022    9:43 AM 03/09/2022   10:05 AM  Fall Risk   Falls in the past year? 0 0 0 0 0  Number falls in past yr: 0    0  Injury with Fall? 0 0 0 0 0  Risk for fall due to : No Fall Risks No Fall Risks   No Fall Risks  Follow up Falls prevention discussed;Falls evaluation completed    Falls evaluation completed    MEDICARE RISK AT HOME:  Medicare Risk at Home - 04/09/23 1532     Any stairs in or around the home? No    If so, are there any without handrails? No    Home free of loose throw rugs in walkways, pet beds, electrical cords, etc? Yes    Adequate lighting in your home to reduce risk of falls? Yes    Life alert? No    Use of a cane, walker or w/c? No    Grab bars in the bathroom? No    Shower chair or bench in shower? No    Elevated toilet seat or a handicapped toilet? No             TIMED UP AND GO:  Was the test performed?  No    Cognitive Function:        04/09/2023  3:32 PM 03/09/2022   10:07 AM  6CIT Screen  What Year? 0 points 0 points  What month? 0 points 0 points  What time? 0 points 0 points  Count back from 20 0 points 0 points  Months in reverse 0 points 0 points  Repeat phrase 2 points 4 points  Total Score 2 points 4 points    Immunizations Immunization History  Administered Date(s) Administered   Fluad Quad(high Dose 65+) 06/10/2021   Influenza Inj Mdck Quad Pf 06/20/2018, 06/22/2019   Influenza-Unspecified 07/03/2015, 06/11/2016   Pneumococcal Conjugate-13  12/10/2015   Pneumococcal Polysaccharide-23 06/01/2014, 09/04/2021    TDAP status: Due, Education has been provided regarding the importance of this vaccine. Advised may receive this vaccine at local pharmacy or Health Dept. Aware to provide a copy of the vaccination record if obtained from local pharmacy or Health Dept. Verbalized acceptance and understanding.  Flu Vaccine status: Declined, Education has been provided regarding the importance of this vaccine but patient still declined. Advised may receive this vaccine at local pharmacy or Health Dept. Aware to provide a copy of the vaccination record if obtained from local pharmacy or Health Dept. Verbalized acceptance and understanding.  Pneumococcal vaccine status: Up to date  Covid-19 vaccine status: Declined, Education has been provided regarding the importance of this vaccine but patient still declined. Advised may receive this vaccine at local pharmacy or Health Dept.or vaccine clinic. Aware to provide a copy of the vaccination record if obtained from local pharmacy or Health Dept. Verbalized acceptance and understanding.  Qualifies for Shingles Vaccine? Yes   Zostavax completed No   Shingrix Completed?: No.    Education has been provided regarding the importance of this vaccine. Patient has been advised to call insurance company to determine out of pocket expense if they have not yet received this vaccine. Advised may also receive vaccine at local pharmacy or Health Dept. Verbalized acceptance and understanding.  Screening Tests Health Maintenance  Topic Date Due   DTaP/Tdap/Td (1 - Tdap) Never done   Zoster Vaccines- Shingrix (1 of 2) Never done   FOOT EXAM  03/10/2023   HEMOGLOBIN A1C  03/11/2023   INFLUENZA VACCINE  04/22/2023   Diabetic kidney evaluation - eGFR measurement  09/10/2023   Diabetic kidney evaluation - Urine ACR  09/10/2023   OPHTHALMOLOGY EXAM  01/01/2024   Medicare Annual Wellness (AWV)  04/08/2024   Pneumonia  Vaccine 31+ Years old  Completed   DEXA SCAN  Completed   HPV VACCINES  Aged Out   COVID-19 Vaccine  Discontinued    Health Maintenance  Health Maintenance Due  Topic Date Due   DTaP/Tdap/Td (1 - Tdap) Never done   Zoster Vaccines- Shingrix (1 of 2) Never done   FOOT EXAM  03/10/2023   HEMOGLOBIN A1C  03/11/2023    Colorectal cancer screening: No longer required.   Mammogram status: No longer required due to age.  Bone Density status: Completed 04/22/22. Results reflect: Bone density results: OSTEOPENIA. Repeat every 5 years.  Lung Cancer Screening: (Low Dose CT Chest recommended if Age 23-80 years, 20 pack-year currently smoking OR have quit w/in 15years.) does not qualify.   Additional Screening:  Hepatitis C Screening: does not qualify; Completed no  Vision Screening: Recommended annual ophthalmology exams for early detection of glaucoma and other disorders of the eye. Is the patient up to date with their annual eye exam?  Yes  Who is the provider or what is the name of the office in which  the patient attends annual eye exams? Dr.Brasington If pt is not established with a provider, would they like to be referred to a provider to establish care? No .   Dental Screening: Recommended annual dental exams for proper oral hygiene   Community Resource Referral / Chronic Care Management: CRR required this visit?  No   CCM required this visit?  No     Plan:     I have personally reviewed and noted the following in the patient's chart:   Medical and social history Use of alcohol, tobacco or illicit drugs  Current medications and supplements including opioid prescriptions. Patient is not currently taking opioid prescriptions. Functional ability and status Nutritional status Physical activity Advanced directives List of other physicians Hospitalizations, surgeries, and ER visits in previous 12 months Vitals Screenings to include cognitive, depression, and  falls Referrals and appointments  In addition, I have reviewed and discussed with patient certain preventive protocols, quality metrics, and best practice recommendations. A written personalized care plan for preventive services as well as general preventive health recommendations were provided to patient.     Hal Hope, LPN   12/18/5186   After Visit Summary: (MyChart) Due to this being a telephonic visit, the after visit summary with patients personalized plan was offered to patient via MyChart   Nurse Notes: none

## 2023-04-09 NOTE — Patient Instructions (Signed)
Brooke King , Thank you for taking time to come for your Medicare Wellness Visit. I appreciate your ongoing commitment to your health goals. Please review the following plan we discussed and let me know if I can assist you in the future.   These are the goals we discussed:  Goals      DIET - EAT MORE FRUITS AND VEGETABLES     Eat Healthy     Pharmacy Goals     Check your blood pressure once daily, and any time you have concerning symptoms like headache, chest pain, dizziness, shortness of breath, or vision changes.   To appropriately check your blood pressure, make sure you do the following:  1) Avoid caffeine, exercise, or tobacco products for 30 minutes before checking. Empty your bladder. 2) Sit with your back supported in a flat-backed chair. Rest your arm on something flat (arm of the chair, table, etc). 3) Sit still with your feet flat on the floor, resting, for at least 5 minutes.  4) Check your blood pressure. Take 1-2 readings.  5) Write down these readings and bring with you to any provider appointments.  Bring your home blood pressure machine with you to a provider's office for accuracy comparison at least once a year.   Make sure you take your blood pressure medications before you come to any office visit, even if you were asked to fast for labs.  Feel free to call me with any questions or concerns.    Estelle Grumbles, PharmD, BCACP Clinical Pharmacist [facility] [phone]        This is a list of the screening recommended for you and due dates:  Health Maintenance  Topic Date Due   DTaP/Tdap/Td vaccine (1 - Tdap) Never done   Zoster (Shingles) Vaccine (1 of 2) Never done   Complete foot exam   03/10/2023   Hemoglobin A1C  03/11/2023   Flu Shot  04/22/2023   Yearly kidney function blood test for diabetes  09/10/2023   Yearly kidney health urinalysis for diabetes  09/10/2023   Eye exam for diabetics  01/01/2024   Medicare Annual  Wellness Visit  04/08/2024   Pneumonia Vaccine  Completed   DEXA scan (bone density measurement)  Completed   HPV Vaccine  Aged Out   COVID-19 Vaccine  Discontinued    Advanced directives: no  Conditions/risks identified: none  Next appointment: Follow up in one year for your annual wellness visit 04/21/24 @ 1:30 pm by phone   Preventive Care 65 Years and Older, Female Preventive care refers to lifestyle choices and visits with your health care provider that can promote health and wellness. What does preventive care include? A yearly physical exam. This is also called an annual well check. Dental exams once or twice a year. Routine eye exams. Ask your health care provider how often you should have your eyes checked. Personal lifestyle choices, including: Daily care of your teeth and gums. Regular physical activity. Eating a healthy diet. Avoiding tobacco and drug use. Limiting alcohol use. Practicing safe sex. Taking low-dose aspirin every day. Taking vitamin and mineral supplements as recommended by your health care provider. What happens during an annual well check? The services and screenings done by your health care provider during your annual well check will depend on your age, overall health, lifestyle risk factors, and family history of disease. Counseling  Your health care provider may ask you questions about your: Alcohol use. Tobacco use. Drug use.  Emotional well-being. Home and relationship well-being. Sexual activity. Eating habits. History of falls. Memory and ability to understand (cognition). Work and work Astronomer. Reproductive health. Screening  You may have the following tests or measurements: Height, weight, and BMI. Blood pressure. Lipid and cholesterol levels. These may be checked every 5 years, or more frequently if you are over 25 years old. Skin check. Lung cancer screening. You may have this screening every year starting at age 93 if you  have a 30-pack-year history of smoking and currently smoke or have quit within the past 15 years. Fecal occult blood test (FOBT) of the stool. You may have this test every year starting at age 33. Flexible sigmoidoscopy or colonoscopy. You may have a sigmoidoscopy every 5 years or a colonoscopy every 10 years starting at age 50. Hepatitis C blood test. Hepatitis B blood test. Sexually transmitted disease (STD) testing. Diabetes screening. This is done by checking your blood sugar (glucose) after you have not eaten for a while (fasting). You may have this done every 1-3 years. Bone density scan. This is done to screen for osteoporosis. You may have this done starting at age 73. Mammogram. This may be done every 1-2 years. Talk to your health care provider about how often you should have regular mammograms. Talk with your health care provider about your test results, treatment options, and if necessary, the need for more tests. Vaccines  Your health care provider may recommend certain vaccines, such as: Influenza vaccine. This is recommended every year. Tetanus, diphtheria, and acellular pertussis (Tdap, Td) vaccine. You may need a Td booster every 10 years. Zoster vaccine. You may need this after age 59. Pneumococcal 13-valent conjugate (PCV13) vaccine. One dose is recommended after age 20. Pneumococcal polysaccharide (PPSV23) vaccine. One dose is recommended after age 9. Talk to your health care provider about which screenings and vaccines you need and how often you need them. This information is not intended to replace advice given to you by your health care provider. Make sure you discuss any questions you have with your health care provider. Document Released: 10/04/2015 Document Revised: 05/27/2016 Document Reviewed: 07/09/2015 Elsevier Interactive Patient Education  2017 [employer].  Fall Prevention in the Home Falls can cause injuries. They can happen to people of all ages. There are  many things you can do to make your home safe and to help prevent falls. What can I do on the outside of my home? Regularly fix the edges of walkways and driveways and fix any cracks. Remove anything that might make you trip as you walk through a door, such as a raised step or threshold. Trim any bushes or trees on the path to your home. Use bright outdoor lighting. Clear any walking paths of anything that might make someone trip, such as rocks or tools. Regularly check to see if handrails are loose or broken. Make sure that both sides of any steps have handrails. Any raised decks and porches should have guardrails on the edges. Have any leaves, snow, or ice cleared regularly. Use sand or salt on walking paths during winter. Clean up any spills in your garage right away. This includes oil or grease spills. What can I do in the bathroom? Use night lights. Install grab bars by the toilet and in the tub and shower. Do not use towel bars as grab bars. Use non-skid mats or decals in the tub or shower. If you need to sit down in the shower, use a plastic, non-slip stool.  Keep the floor dry. Clean up any water that spills on the floor as soon as it happens. Remove soap buildup in the tub or shower regularly. Attach bath mats securely with double-sided non-slip rug tape. Do not have throw rugs and other things on the floor that can make you trip. What can I do in the bedroom? Use night lights. Make sure that you have a light by your bed that is easy to reach. Do not use any sheets or blankets that are too big for your bed. They should not hang down onto the floor. Have a firm chair that has side arms. You can use this for support while you get dressed. Do not have throw rugs and other things on the floor that can make you trip. What can I do in the kitchen? Clean up any spills right away. Avoid walking on wet floors. Keep items that you use a lot in easy-to-reach places. If you need to reach  something above you, use a strong step stool that has a grab bar. Keep electrical cords out of the way. Do not use floor polish or wax that makes floors slippery. If you must use wax, use non-skid floor wax. Do not have throw rugs and other things on the floor that can make you trip. What can I do with my stairs? Do not leave any items on the stairs. Make sure that there are handrails on both sides of the stairs and use them. Fix handrails that are broken or loose. Make sure that handrails are as long as the stairways. Check any carpeting to make sure that it is firmly attached to the stairs. Fix any carpet that is loose or worn. Avoid having throw rugs at the top or bottom of the stairs. If you do have throw rugs, attach them to the floor with carpet tape. Make sure that you have a light switch at the top of the stairs and the bottom of the stairs. If you do not have them, ask someone to add them for you. What else can I do to help prevent falls? Wear shoes that: Do not have high heels. Have rubber bottoms. Are comfortable and fit you well. Are closed at the toe. Do not wear sandals. If you use a stepladder: Make sure that it is fully opened. Do not climb a closed stepladder. Make sure that both sides of the stepladder are locked into place. Ask someone to hold it for you, if possible. Clearly mark and make sure that you can see: Any grab bars or handrails. First and last steps. Where the edge of each step is. Use tools that help you move around (mobility aids) if they are needed. These include: Canes. Walkers. Scooters. Crutches. Turn on the lights when you go into a dark area. Replace any light bulbs as soon as they burn out. Set up your furniture so you have a clear path. Avoid moving your furniture around. If any of your floors are uneven, fix them. If there are any pets around you, be aware of where they are. Review your medicines with your doctor. Some medicines can make you  feel dizzy. This can increase your chance of falling. Ask your doctor what other things that you can do to help prevent falls. This information is not intended to replace advice given to you by your health care provider. Make sure you discuss any questions you have with your health care provider. Document Released: 07/04/2009 Document Revised: 02/13/2016 Document Reviewed: 10/12/2014  Risk analyst Patient Education  [employer].

## 2023-04-26 DIAGNOSIS — G4733 Obstructive sleep apnea (adult) (pediatric): Secondary | ICD-10-CM | POA: Diagnosis not present

## 2023-05-27 ENCOUNTER — Other Ambulatory Visit: Payer: Self-pay | Admitting: Internal Medicine

## 2023-05-27 DIAGNOSIS — E785 Hyperlipidemia, unspecified: Secondary | ICD-10-CM

## 2023-05-28 NOTE — Telephone Encounter (Signed)
Requested Prescriptions  Pending Prescriptions Disp Refills   valsartan (DIOVAN) 80 MG tablet [Pharmacy Med Name: Valsartan 80 MG Oral Tablet] 80 tablet 3    Sig: TAKE 1 TABLET BY MOUTH DAILY     Cardiovascular:  Angiotensin Receptor Blockers Failed - 05/27/2023 10:05 PM      Failed - Cr in normal range and within 180 days    Creat  Date Value Ref Range Status  09/09/2022 0.84 0.60 - 0.95 mg/dL Final   Creatinine, Urine  Date Value Ref Range Status  09/09/2022 57 20 - 275 mg/dL Final         Failed - K in normal range and within 180 days    Potassium  Date Value Ref Range Status  09/09/2022 4.0 3.5 - 5.3 mmol/L Final  12/15/2013 3.8 3.5 - 5.1 mmol/L Final         Passed - Patient is not pregnant      Passed - Last BP in normal range    BP Readings from Last 1 Encounters:  04/06/23 124/68         Passed - Valid encounter within last 6 months    Recent Outpatient Visits           2 months ago Simple chronic bronchitis (HCC)   [place] [facility] [place], Salvadore Oxford, NP   3 months ago COPD exacerbation [facility])   [place] [facility] [place], Salvadore Oxford, NP   7 months ago Pharyngitis, unspecified etiology   [place] [facility] [place], Salvadore Oxford, NP   7 months ago COPD exacerbation [facility])    [facility] [place], Salvadore Oxford, NP   8 months ago Encounter for general adult medical examination with abnormal findings    [facility] [place], Salvadore Oxford, NP       Future Appointments             In 2 months McGowan, Wellington [place], PA-C [facility] [place]             atorvastatin (LIPITOR) 10 MG tablet [Pharmacy Med Name: Atorvastatin Calcium 10 MG Oral Tablet] 90 tablet 3    Sig: TAKE 1 TABLET BY MOUTH DAILY     Cardiovascular:  Antilipid - Statins Failed - 05/27/2023 10:05 PM      Failed - Lipid Panel in normal range within the last 12 months    Cholesterol  Date Value  Ref Range Status  09/09/2022 153 <200 mg/dL Final   LDL Cholesterol (Calc)  Date Value Ref Range Status  09/09/2022 78 mg/dL (calc) Final    Comment:    Reference range: <100 . Desirable range <100 mg/dL for primary prevention;   <70 mg/dL for patients with CHD or diabetic patients  with > or = 2 CHD risk factors. Marland Kitchen LDL-C is now calculated using the Martin-Hopkins  calculation, which is a validated novel method providing  better accuracy than the Friedewald equation in the  estimation of LDL-C.  Horald Pollen et al. Lenox Ahr. 9563;875(64): 2061-2068  (http://education.[employer].com/faq/FAQ164)    HDL  Date Value Ref Range Status  09/09/2022 57 > OR = 50 mg/dL Final   Triglycerides  Date Value Ref Range Status  09/09/2022 93 <150 mg/dL Final         Passed - Patient is not pregnant      Passed - Valid encounter within last 12 months    Recent Outpatient Visits  2 months ago Simple chronic bronchitis [facility])   [place] [facility] [place], Salvadore Oxford, NP   3 months ago COPD exacerbation [facility])   [place] [facility] [place], Salvadore Oxford, NP   7 months ago Pharyngitis, unspecified etiology   [place] [facility] [place], Salvadore Oxford, NP   7 months ago COPD exacerbation [facility])   [place] [facility] [place], Salvadore Oxford, NP   8 months ago Encounter for general adult medical examination with abnormal findings   [place] [facility] [place], Salvadore Oxford, NP       Future Appointments             In 2 months McGowan, Elana [facility]

## 2023-06-08 DIAGNOSIS — M1711 Unilateral primary osteoarthritis, right knee: Secondary | ICD-10-CM | POA: Diagnosis not present

## 2023-06-08 DIAGNOSIS — M25461 Effusion, right knee: Secondary | ICD-10-CM | POA: Diagnosis not present

## 2023-06-08 DIAGNOSIS — G8929 Other chronic pain: Secondary | ICD-10-CM | POA: Diagnosis not present

## 2023-06-08 DIAGNOSIS — M25561 Pain in right knee: Secondary | ICD-10-CM | POA: Diagnosis not present

## 2023-06-18 ENCOUNTER — Other Ambulatory Visit: Payer: Self-pay | Admitting: Internal Medicine

## 2023-06-21 NOTE — Telephone Encounter (Signed)
Requested Prescriptions  Pending Prescriptions Disp Refills   montelukast (SINGULAIR) 10 MG tablet [Pharmacy Med Name: Montelukast Sodium 10 MG Oral Tablet] 100 tablet 2    Sig: TAKE 1 TABLET BY MOUTH AT  BEDTIME     Pulmonology:  Leukotriene Inhibitors Passed - 06/18/2023 10:13 PM      Passed - Valid encounter within last 12 months    Recent Outpatient Visits           3 months ago Simple chronic bronchitis [facility])   [place] [facility] [place], Salvadore Oxford, NP   4 months ago COPD exacerbation [facility])   [place] [facility] [place], Salvadore Oxford, NP   8 months ago Pharyngitis, unspecified etiology   [facility] [place], Salvadore Oxford, NP   8 months ago COPD exacerbation [facility])   [place] [facility] [place], Salvadore Oxford, NP   9 months ago Encounter for general adult medical examination with abnormal findings    [facility] [place], Salvadore Oxford, NP       Future Appointments             In 1 month McGowan, Elana Alm [facility] [place] [facility]

## 2023-06-30 ENCOUNTER — Other Ambulatory Visit: Payer: Self-pay | Admitting: Internal Medicine

## 2023-06-30 DIAGNOSIS — F5101 Primary insomnia: Secondary | ICD-10-CM

## 2023-06-30 MED ORDER — TRAZODONE HCL 50 MG PO TABS: 25.0000 mg | ORAL_TABLET | Freq: Every evening | ORAL | 0 refills | Status: DC | PRN

## 2023-06-30 NOTE — Telephone Encounter (Signed)
Requested Prescriptions  Pending Prescriptions Disp Refills   traZODone (DESYREL) 50 MG tablet 45 tablet 0    Sig: Take 0.5 tablets (25 mg total) by mouth at bedtime as needed for sleep.     Psychiatry: Antidepressants - Serotonin Modulator Passed - 06/30/2023  9:31 AM      Passed - Valid encounter within last 6 months    Recent Outpatient Visits           3 months ago Simple chronic bronchitis [facility])   [place] [facility] [place], Salvadore Oxford, NP   4 months ago COPD exacerbation [facility])   [place] [facility] [place], Salvadore Oxford, NP   8 months ago Pharyngitis, unspecified etiology   [place] [facility] [place], Salvadore Oxford, NP   8 months ago COPD exacerbation [facility])   [place] [facility] [place], Salvadore Oxford, NP   9 months ago Encounter for general adult medical examination with abnormal findings   [place] [facility] [place], Salvadore Oxford, NP       Future Appointments             In 1 month McGowan, Elana Alm [facility]

## 2023-06-30 NOTE — Telephone Encounter (Signed)
Medication Refill - Medication: traZODone (DESYREL) 50 MG tablet   Pt is completely out of her current supply   Has the patient contacted their pharmacy? Yes.   (Agent: If no, request that the patient contact the pharmacy for the refill. If patient does not wish to contact the pharmacy document the reason why and proceed with request.) (Agent: If yes, when and what did the pharmacy advise?)  Preferred Pharmacy (with phone number or street name):  [facility] [place] - [address]  [address] [place] [address]  Phone: [phone] Fax: [phone]   Has the patient been seen for an appointment in the last year OR does the patient have an upcoming appointment? Yes.    Agent: Please be advised that RX refills may take up to 3 business days. We ask that you follow-up with your pharmacy.

## 2023-07-04 ENCOUNTER — Other Ambulatory Visit: Payer: Self-pay | Admitting: Internal Medicine

## 2023-07-04 DIAGNOSIS — E039 Hypothyroidism, unspecified: Secondary | ICD-10-CM

## 2023-07-05 ENCOUNTER — Encounter: Payer: Self-pay | Admitting: *Deleted

## 2023-07-05 NOTE — Telephone Encounter (Signed)
Requested by interface surescripts. Protocol met. Requested Prescriptions  Pending Prescriptions Disp Refills   SYNTHROID 112 MCG tablet [Pharmacy Med Name: SYNTHROID   TAB] 100 tablet 2    Sig: TAKE 1 TABLET BY MOUTH DAILY  BEFORE BREAKFAST     Endocrinology:  Hypothyroid Agents Passed - 07/04/2023 10:02 PM      Passed - TSH in normal range and within 360 days    TSH  Date Value Ref Range Status  09/09/2022 0.74 0.40 - 4.50 mIU/L Final         Passed - Valid encounter within last 12 months    Recent Outpatient Visits           4 months ago Simple chronic bronchitis [facility])   [place] [facility] [place], Salvadore Oxford, NP   4 months ago COPD exacerbation [facility])   [place] [facility] [place], Salvadore Oxford, NP   8 months ago Pharyngitis, unspecified etiology   [place] [place] [place], Salvadore Oxford, NP   9 months ago COPD exacerbation [facility])   [place] [facility] [place], Salvadore Oxford, NP   9 months ago Encounter for general adult medical examination with abnormal findings    [facility] [place], Salvadore Oxford, NP       Future Appointments             In 1 month McGowan, Elana Alm [facility] [place]

## 2023-07-05 NOTE — Progress Notes (Signed)
Please consider ordering a urine albumin/creatinine ratio lab test to assist in closing the KED gap Thanks

## 2023-07-07 NOTE — Progress Notes (Signed)
Patient is scheduled   

## 2023-07-13 DIAGNOSIS — H401121 Primary open-angle glaucoma, left eye, mild stage: Secondary | ICD-10-CM | POA: Diagnosis not present

## 2023-07-13 DIAGNOSIS — Z961 Presence of intraocular lens: Secondary | ICD-10-CM | POA: Diagnosis not present

## 2023-07-13 DIAGNOSIS — H401112 Primary open-angle glaucoma, right eye, moderate stage: Secondary | ICD-10-CM | POA: Diagnosis not present

## 2023-07-14 ENCOUNTER — Ambulatory Visit (INDEPENDENT_AMBULATORY_CARE_PROVIDER_SITE_OTHER): Payer: Medicare Other | Admitting: Internal Medicine

## 2023-07-14 ENCOUNTER — Encounter: Payer: Self-pay | Admitting: Internal Medicine

## 2023-07-14 VITALS — BP 120/70 | HR 73 | Ht 63.0 in | Wt 174.0 lb

## 2023-07-14 DIAGNOSIS — I10 Essential (primary) hypertension: Secondary | ICD-10-CM

## 2023-07-14 DIAGNOSIS — L03114 Cellulitis of left upper limb: Secondary | ICD-10-CM

## 2023-07-14 DIAGNOSIS — K219 Gastro-esophageal reflux disease without esophagitis: Secondary | ICD-10-CM

## 2023-07-14 DIAGNOSIS — J410 Simple chronic bronchitis: Secondary | ICD-10-CM

## 2023-07-14 DIAGNOSIS — F5101 Primary insomnia: Secondary | ICD-10-CM

## 2023-07-14 DIAGNOSIS — Z6830 Body mass index (BMI) 30.0-30.9, adult: Secondary | ICD-10-CM

## 2023-07-14 DIAGNOSIS — E1169 Type 2 diabetes mellitus with other specified complication: Secondary | ICD-10-CM | POA: Diagnosis not present

## 2023-07-14 DIAGNOSIS — E039 Hypothyroidism, unspecified: Secondary | ICD-10-CM | POA: Diagnosis not present

## 2023-07-14 DIAGNOSIS — E66811 Obesity, class 1: Secondary | ICD-10-CM

## 2023-07-14 DIAGNOSIS — G4733 Obstructive sleep apnea (adult) (pediatric): Secondary | ICD-10-CM

## 2023-07-14 DIAGNOSIS — I700 Atherosclerosis of aorta: Secondary | ICD-10-CM

## 2023-07-14 DIAGNOSIS — E785 Hyperlipidemia, unspecified: Secondary | ICD-10-CM

## 2023-07-14 DIAGNOSIS — M810 Age-related osteoporosis without current pathological fracture: Secondary | ICD-10-CM

## 2023-07-14 DIAGNOSIS — E6609 Other obesity due to excess calories: Secondary | ICD-10-CM

## 2023-07-14 DIAGNOSIS — F419 Anxiety disorder, unspecified: Secondary | ICD-10-CM

## 2023-07-14 DIAGNOSIS — D696 Thrombocytopenia, unspecified: Secondary | ICD-10-CM

## 2023-07-14 DIAGNOSIS — E1142 Type 2 diabetes mellitus with diabetic polyneuropathy: Secondary | ICD-10-CM

## 2023-07-14 DIAGNOSIS — N393 Stress incontinence (female) (male): Secondary | ICD-10-CM

## 2023-07-14 MED ORDER — DOXYCYCLINE HYCLATE 100 MG PO TABS
100.0000 mg | ORAL_TABLET | Freq: Two times a day (BID) | ORAL | 0 refills | Status: DC
Start: 2023-07-14 — End: 2023-08-24

## 2023-07-14 NOTE — Assessment & Plan Note (Signed)
Continue Myrbetriq 

## 2023-07-14 NOTE — Assessment & Plan Note (Signed)
Avoid foods that trigger reflux Encourage weight loss as this can produce reflux symptoms Continue omeprazole 

## 2023-07-14 NOTE — Assessment & Plan Note (Signed)
Continue trazodone 

## 2023-07-14 NOTE — Assessment & Plan Note (Signed)
C-Met and lipid profile today Encouraged her to consume a low-fat diet Continue atorvastatin Not taking aspirin due to thrombocytopenia and nosebleeds

## 2023-07-14 NOTE — Assessment & Plan Note (Signed)
Encourage smoking cessation Continue montelukast, Anoro and albuterol

## 2023-07-14 NOTE — Patient Instructions (Signed)

## 2023-07-14 NOTE — Assessment & Plan Note (Signed)
Controlled on valsartan as needed Reinforced DASH diet and exercise for weight loss C-Met today

## 2023-07-14 NOTE — Assessment & Plan Note (Signed)
TSH and free T4 today We will adjust levothyroxine if needed based on labs 

## 2023-07-14 NOTE — Assessment & Plan Note (Signed)
Not medicated Monitor

## 2023-07-14 NOTE — Assessment & Plan Note (Signed)
Encourage weight loss as this can help reduce sleep apnea symptoms Continue CPAP use 

## 2023-07-14 NOTE — Assessment & Plan Note (Signed)
Encouraged diet and exercise for weight loss ?

## 2023-07-14 NOTE — Assessment & Plan Note (Signed)
C-Met and lipid profile today Encouraged her to consume a low-fat diet Continue atorvastatin 

## 2023-07-14 NOTE — Progress Notes (Signed)
Subjective:    Patient ID: Brooke King, female    DOB: 1940-12-11, 82 y.o.   MRN: [id number]  HPI  Patient presents to clinic today for follow-up of chronic conditions.  HTN: Her BP today is 120/70.  She is taking valsartan as needed.  She reports that she takes it daily, she will occasionally get lightheaded and have low blood pressure. ECG from 11/2013 reviewed.  HLD with aortic atherosclerosis: Her last LDL was 78, triglycerides 93, 08/2022.  She denies myalgias on atorvastatin.  She is not taking aspirin due to thrombocytopenia and epistaxsis.  She does not consume a low-fat diet.  Thrombocytopenia: Her last platelet count was 132, 08/2022.  She is not taking aspirin.  She does not follow with hematology.  DM2 with peripheral neuropathy: Her last A1c was 6.5%, 08/2022.  She is not taking any oral diabetic medication at this time. She does not check her sugars.  She checks her feet routinely.  Her last eye exam was 10 /2024.  Flu 05/2021.  Pneumovax 08/2021.  Prevnar 11/2015.  COVID never.  GERD: She is not sure what triggers this.  She denies breakthrough on omeprazole.  Upper GI from 10/2015 reviewed.  COPD: She denies chronic cough or shortness of breath.  She is taking montelukast, anoro and albuterol as prescribed.  There are no PFTs on file.  She does smoke.  She does not follow with pulmonology.  OSA: She averages 6 hours of sleep per night with the use of her CPAP.  Sleep study from 04/2015 reviewed.  Hypothyroidism: Status post thyroidectomy secondary to thyroid cancer.  She denies any issues on her current dose of levothyroxine.  She does not follow with endocrinology.  Osteoporosis: She is taking calcium and vitamin D OTC. She has been on reclast in the past. She does not get much weightbearing exercise.  Bone density from 04/2022 reviewed.  OAB: She reports mainly urgency, frequency and incontinence.  She is taking myrbetriq as prescribed.  She does not follow with  urology.  Insomnia: She has difficulty staying asleep.  She is taking trazodone as prescribed.  Sleep study from 04/2015 reviewed.    Anxiety: Situational.  She is not taking hydroxyzine as prescribed.  She is not currently seeing a therapist.  She denies depression, SI/HI.  She also reports redness and swelling to her left forearm. She noticed this about 2-3 days ago. The area itches. She is unsure if she got bitten by anything. She has OTC anti itch medication.   Review of Systems  Past Medical History:  Diagnosis Date   Allergy    B12 deficiency    Chest pain syndrome    COPD (chronic obstructive pulmonary disease) (HCC)    Depression    GERD (gastroesophageal reflux disease)    Glaucoma    History of colonic polyps    History of degenerative disc disease    c-spine, status post cervical laminectomy   Hyperlipidemia    Hypertension    Hypothyroidism    Insomnia    Migraine    Restless leg syndrome    Seasonal allergies    Sleep apnea    Thyroid cancer (HCC)    Urinary incontinence     Current Outpatient Medications  Medication Sig Dispense Refill   acetaminophen (TYLENOL) 500 MG tablet Take 500 mg by mouth every 6 (six) hours as needed.     albuterol (VENTOLIN HFA) 108 (90 Base) MCG/ACT inhaler USE 1 TO 2 INHALATIONS BY MOUTH  INTO THE LUNGS EVERY 6 HOURS AS  NEEDED FOR WHEEZING OR SHORTNESS OF BREATH 34 g 1   atorvastatin (LIPITOR) 10 MG tablet TAKE 1 TABLET BY MOUTH DAILY 90 tablet 1   Calcium Carb-Cholecalciferol (CALCIUM + D3) 600-200 MG-UNIT TABS Take by mouth.     cetirizine (ZYRTEC) 10 MG tablet Take 10 mg by mouth daily.     Cranberry 180 MG CAPS Take by mouth.     hydrOXYzine (ATARAX/VISTARIL) 10 MG tablet Take 1 tablet (10 mg total) by mouth daily as needed. 30 tablet 0   latanoprost (XALATAN) 0.005 % ophthalmic solution Place 1 drop into both eyes at bedtime.     mirabegron ER (MYRBETRIQ) 50 MG TB24 tablet Take 1 tablet (50 mg total) by mouth daily. 90 tablet  3   montelukast (SINGULAIR) 10 MG tablet TAKE 1 TABLET BY MOUTH AT  BEDTIME 100 tablet 2   omeprazole (PRILOSEC) 20 MG capsule TAKE 1 CAPSULE BY MOUTH DAILY 100 capsule 1   SYNTHROID 112 MCG tablet TAKE 1 TABLET BY MOUTH DAILY  BEFORE BREAKFAST 100 tablet 2   traZODone (DESYREL) 50 MG tablet Take 0.5 tablets (25 mg total) by mouth at bedtime as needed for sleep. 45 tablet 0   triamcinolone cream (KENALOG) 0.1 % Apply 1 application. topically 2 (two) times daily. 30 g 0   umeclidinium-vilanterol (ANORO ELLIPTA) 62.5-25 MCG/ACT AEPB Inhale 1 puff into the lungs daily. 30 each 11   valsartan (DIOVAN) 80 MG tablet TAKE 1 TABLET BY MOUTH DAILY 80 tablet 1   vitamin B-12 (CYANOCOBALAMIN) 1000 MCG tablet Take 1,000 mcg by mouth daily.     No current facility-administered medications for this visit.    Allergies  Allergen Reactions   Toviaz [Fesoterodine Fumarate Er] Other (See Comments)    Throat closed   Cortisone    Doxycycline Hyclate    Hydrocodone    Keflex [Cephalexin]    Septra [Sulfamethoxazole-Trimethoprim]    Tagamet [Cimetidine]    Zoloft [Sertraline]    Penicillins Rash    Family History  Problem Relation Age of Onset   Heart disease Mother    Breast cancer Neg Hx     Social History   Socioeconomic History   Marital status: Widowed    Spouse name: Not on file   Number of children: Not on file   Years of education: Not on file   Highest education level: Not on file  Occupational History   Occupation: Retired  Tobacco Use   Smoking status: Every Day    Current packs/day: 1.00    Average packs/day: 1 pack/day for 102.8 years (102.8 ttl pk-yrs)    Types: Cigarettes    Start date: 1962   Smokeless tobacco: Never  Vaping Use   Vaping status: Never Used  Substance and Sexual Activity   Alcohol use: No   Drug use: No   Sexual activity: Not on file  Other Topics Concern   Not on file  Social History Narrative   Not on file   Social Determinants of Health    Financial Resource Strain: Low Risk  (04/09/2023)   Overall Financial Resource Strain (CARDIA)    Difficulty of Paying Living Expenses: Not very hard  Food Insecurity: No Food Insecurity (04/09/2023)   Hunger Vital Sign    Worried About Running Out of Food in the Last Year: Never true    Ran Out of Food in the Last Year: Never true  Transportation Needs: No Transportation Needs (04/09/2023)  PRAPARE - Administrator, Civil Service (Medical): No    Lack of Transportation (Non-Medical): No  Physical Activity: Insufficiently Active (04/09/2023)   Exercise Vital Sign    Days of Exercise per Week: 7 days    Minutes of Exercise per Session: 10 min  Stress: No Stress Concern Present (04/09/2023)   [employer] of Occupational Health - Occupational Stress Questionnaire    Feeling of Stress : Only a little  Social Connections: Socially Isolated (04/09/2023)   Social Connection and Isolation Panel [NHANES]    Frequency of Communication with Friends and Family: More than three times a week    Frequency of Social Gatherings with Friends and Family: More than three times a week    Attends Religious Services: Never    Database administrator or Organizations: No    Attends Banker Meetings: Never    Marital Status: Widowed  Intimate Partner Violence: Not At Risk (04/09/2023)   Humiliation, Afraid, Rape, and Kick questionnaire    Fear of Current or Ex-Partner: No    Emotionally Abused: No    Physically Abused: No    Sexually Abused: No     Constitutional: Denies fever, malaise, fatigue, headache or abrupt weight changes.  HEENT: Denies eye pain, eye redness, ear pain, ringing in the ears, wax buildup, runny nose, nasal congestion, bloody nose, or sore throat. Respiratory: Denies difficulty breathing, shortness of breath, cough or sputum production.   Cardiovascular: Denies chest pain, chest tightness, palpitations or swelling in the hands or feet.  Gastrointestinal:  Denies abdominal pain, bloating, constipation, diarrhea or blood in the stool.  GU: Denies urgency, frequency, pain with urination, burning sensation, blood in urine, odor or discharge. Musculoskeletal: Denies decrease in range of motion, difficulty with gait, muscle pain or joint pain and swelling.  Skin: Pt reports redness and swelling to left forearm. Denies lesions or ulcercations.  Neurological: Patient reports insomnia.  Denies dizziness, difficulty with memory, difficulty with speech or problems with balance and coordination.  Psych: Patient has a history of anxiety.  Denies depression, SI/HI.  No other specific complaints in a complete review of systems (except as listed in HPI above).     Objective:   Physical Exam   BP 120/70   Pulse 73   Ht 5\' 3"  (1.6 m)   Wt 174 lb (78.9 kg)   SpO2 97%   BMI 30.82 kg/m   Wt Readings from Last 3 Encounters:  04/09/23 171 lb (77.6 kg)  04/06/23 171 lb 12.8 oz (77.9 kg)  03/03/23 172 lb (78 kg)    General: Appears her stated age, obese in NAD. Skin: Warm, dry and intact.  Area of cellulitis noted to left forearm.  No ulcerations noted. HEENT: Head: normal shape and size; Eyes: sclera [place], no icterus, conjunctiva pink, PERRLA and EOMs intact;  Neck:  Neck supple, trachea midline. No masses, lumps or thyromegaly present.  Cardiovascular: Normal rate and rhythm. S1,S2 noted.  No murmur, rubs or gallops noted. No JVD or BLE edema. No carotid bruits noted. Pulmonary/Chest: Normal effort and positive vesicular breath sounds. No respiratory distress. No wheezes, rales or ronchi noted.  Abdomen: Soft and nontender. Normal bowel sounds.  Musculoskeletal: No difficulty with gait.  Neurological: Alert and oriented. Coordination normal.  Psychiatric: Mood and affect normal. Behavior is normal. Judgment and thought content normal.    BMET    Component Value Date/Time   NA 143 09/09/2022 0943   K 4.0 09/09/2022 0943  K 3.8 12/15/2013 1202    CL 107 09/09/2022 0943   CO2 26 09/09/2022 0943   GLUCOSE 126 (H) 09/09/2022 0943   BUN 14 09/09/2022 0943   CREATININE 0.84 09/09/2022 0943   CALCIUM 8.9 09/09/2022 0943   GFRNONAA >60 01/19/2012 0949   GFRAA >60 01/19/2012 0949    Lipid Panel     Component Value Date/Time   CHOL 153 09/09/2022 0943   TRIG 93 09/09/2022 0943   HDL 57 09/09/2022 0943   CHOLHDL 2.7 09/09/2022 0943   LDLCALC 78 09/09/2022 0943    CBC    Component Value Date/Time   WBC 6.4 09/09/2022 0943   RBC 4.54 09/09/2022 0943   HGB 14.2 09/09/2022 0943   HCT 42.4 09/09/2022 0943   PLT 132 (L) 09/09/2022 0943   MCV 93.4 09/09/2022 0943   MCH 31.3 09/09/2022 0943   MCHC 33.5 09/09/2022 0943   RDW 12.4 09/09/2022 0943   LYMPHSABS 2,310 02/10/2022 1333   EOSABS 392 02/10/2022 1333   BASOSABS 91 02/10/2022 1333    Hgb A1C Lab Results  Component Value Date   HGBA1C 6.5 (H) 09/09/2022           Assessment & Plan:   Cellulitis of left forearm:  Rx for doxycycline 100 mg twice daily x 10 days  she has an allergy to doxycycline but she is unsure if this is a true allergy and is willing to try medication. Encouraged warm compresses  RTC in 3 months for annual exam Nicki Reaper, NP

## 2023-07-14 NOTE — Assessment & Plan Note (Signed)
Continue calcium and vitamin D She declines referral to endocrinology for medication management at this time Encouraged daily weightbearing exercise

## 2023-07-14 NOTE — Assessment & Plan Note (Signed)
A1c and urine microalbumin today Encouraged low-carb diet and exercise for weight loss Not medicated Encourage routine eye exam Encouraged routine foot exam She declines flu shot Pneumovax and Prevnar UTD

## 2023-07-14 NOTE — Assessment & Plan Note (Signed)
CBC today.  

## 2023-07-15 LAB — T4, FREE: Free T4: 1.2 ng/dL (ref 0.8–1.8)

## 2023-07-15 LAB — CBC
HCT: 41 % (ref 35.0–45.0)
Hemoglobin: 13.3 g/dL (ref 11.7–15.5)
MCH: 30.9 pg (ref 27.0–33.0)
MCHC: 32.4 g/dL (ref 32.0–36.0)
MCV: 95.1 fL (ref 80.0–100.0)
MPV: 13.6 fL — ABNORMAL HIGH (ref 7.5–12.5)
Platelets: 135 10*3/uL — ABNORMAL LOW (ref 140–400)
RBC: 4.31 10*6/uL (ref 3.80–5.10)
RDW: 12.3 % (ref 11.0–15.0)
WBC: 6.3 10*3/uL (ref 3.8–10.8)

## 2023-07-15 LAB — COMPLETE METABOLIC PANEL WITH GFR
AG Ratio: 1.6 (calc) (ref 1.0–2.5)
ALT: 15 U/L (ref 6–29)
AST: 16 U/L (ref 10–35)
Albumin: 4.1 g/dL (ref 3.6–5.1)
Alkaline phosphatase (APISO): 56 U/L (ref 37–153)
BUN/Creatinine Ratio: 12 (calc) (ref 6–22)
BUN: 13 mg/dL (ref 7–25)
CO2: 27 mmol/L (ref 20–32)
Calcium: 9.6 mg/dL (ref 8.6–10.4)
Chloride: 104 mmol/L (ref 98–110)
Creat: 1.1 mg/dL — ABNORMAL HIGH (ref 0.60–0.95)
Globulin: 2.5 g/dL (ref 1.9–3.7)
Glucose, Bld: 152 mg/dL — ABNORMAL HIGH (ref 65–99)
Potassium: 4.3 mmol/L (ref 3.5–5.3)
Sodium: 141 mmol/L (ref 135–146)
Total Bilirubin: 0.4 mg/dL (ref 0.2–1.2)
Total Protein: 6.6 g/dL (ref 6.1–8.1)
eGFR: 50 mL/min/{1.73_m2} — ABNORMAL LOW (ref >=60)

## 2023-07-15 LAB — LIPID PANEL
Cholesterol: 152 mg/dL (ref <200)
HDL: 54 mg/dL (ref >=50)
LDL Cholesterol (Calc): 71 mg/dL
Non-HDL Cholesterol (Calc): 98 mg/dL (ref <130)
Total CHOL/HDL Ratio: 2.8 (calc) (ref <5.0)
Triglycerides: 206 mg/dL — ABNORMAL HIGH (ref <150)

## 2023-07-15 LAB — HEMOGLOBIN A1C
Hgb A1c MFr Bld: 6.5 %{Hb} — ABNORMAL HIGH (ref <5.7)
Mean Plasma Glucose: 140 mg/dL
eAG (mmol/L): 7.7 mmol/L

## 2023-07-15 LAB — MICROALBUMIN / CREATININE URINE RATIO
Creatinine, Urine: 100 mg/dL (ref 20–275)
Microalb Creat Ratio: 7 mg/g{creat} (ref <30)
Microalb, Ur: 0.7 mg/dL

## 2023-07-15 LAB — TSH: TSH: 1.84 m[IU]/L (ref 0.40–4.50)

## 2023-08-03 ENCOUNTER — Telehealth: Payer: Self-pay | Admitting: Urology

## 2023-08-03 DIAGNOSIS — N3946 Mixed incontinence: Secondary | ICD-10-CM

## 2023-08-03 MED ORDER — MIRABEGRON ER 50 MG PO TB24: 50.0000 mg | ORAL_TABLET | Freq: Every day | ORAL | 3 refills | Status: DC

## 2023-08-03 NOTE — Telephone Encounter (Signed)
Patient called this morning to reschedule her 08/05/23 appointment with Carollee Herter. She is now scheduled for 08/31/23. She is requesting a refill for Myrbetriq (she will run out prior to appt). Pharmacy is [facility].

## 2023-08-05 ENCOUNTER — Ambulatory Visit: Payer: Medicare Other | Admitting: Urology

## 2023-08-08 ENCOUNTER — Other Ambulatory Visit: Payer: Self-pay | Admitting: Internal Medicine

## 2023-08-08 DIAGNOSIS — E785 Hyperlipidemia, unspecified: Secondary | ICD-10-CM

## 2023-08-09 NOTE — Telephone Encounter (Signed)
Requested Prescriptions  Pending Prescriptions Disp Refills   atorvastatin (LIPITOR) 10 MG tablet [Pharmacy Med Name: Atorvastatin Calcium 10 MG Oral Tablet] 100 tablet 2    Sig: TAKE 1 TABLET BY MOUTH DAILY     Cardiovascular:  Antilipid - Statins Failed - 08/08/2023 10:41 PM      Failed - Lipid Panel in normal range within the last 12 months    Cholesterol  Date Value Ref Range Status  07/14/2023 152 <200 mg/dL Final   LDL Cholesterol (Calc)  Date Value Ref Range Status  07/14/2023 71 mg/dL (calc) Final    Comment:    Reference range: <100 . Desirable range <100 mg/dL for primary prevention;   <70 mg/dL for patients with CHD or diabetic patients  with > or = 2 CHD risk factors. Marland Kitchen LDL-C is now calculated using the Martin-Hopkins  calculation, which is a validated novel method providing  better accuracy than the Friedewald equation in the  estimation of LDL-C.  Horald Pollen et al. Lenox Ahr. 5784;696(29): 2061-2068  (http://education.[facility].com/faq/FAQ164)    HDL  Date Value Ref Range Status  07/14/2023 54 > OR = 50 mg/dL Final   Triglycerides  Date Value Ref Range Status  07/14/2023 206 (H) <150 mg/dL Final    Comment:    . If a non-fasting specimen was collected, consider repeat triglyceride testing on a fasting specimen if clinically indicated.  Perry Mount et al. J. of Clin. Lipidol. 2015;9:129-169. Marland Kitchen          Passed - Patient is not pregnant      Passed - Valid encounter within last 12 months    Recent Outpatient Visits           3 weeks ago Age-related osteoporosis without current pathological fracture   [place] [facility] [place], Salvadore Oxford, NP   5 months ago Simple chronic bronchitis [facility])   [place] [facility] [place], Salvadore Oxford, NP   5 months ago COPD exacerbation [facility])   [place] [facility] Lorre Munroe, NP   9 months ago Pharyngitis, unspecified etiology   [place] [facility] [place], Salvadore Oxford, NP   10 months ago COPD exacerbation [facility])   [place] [facility] [place], Salvadore Oxford, NP       Future Appointments             In 3 weeks McGow[place], Elana [facility] [place]   In 2 months [place], Salvadore Oxford, NP [place] [facility], [place]

## 2023-08-18 ENCOUNTER — Ambulatory Visit: Payer: Self-pay

## 2023-08-18 NOTE — Telephone Encounter (Signed)
  Chief Complaint: URI Symptoms: sinus pressure, HA, productive cough, sore throat, runny nose Frequency: couple days Pertinent Negatives: Patient denies fever or SOB Disposition: [] ED /[] Urgent Care (no appt availability in office) / [x] Appointment(In office/virtual)/ []  [place] [facility]/ [] Home Care/ [] Refused Recommended Disposition /[] [place] [facility]/ []  Follow-up with PCP Additional Notes: pt states her daughter was sick and now she has had sx for several days. Has been taking Zyrtec and nasal spray but not improving sx. No appts until 08/24/23, pt scheduled. Pt also concerned about her BP d/t it is normal range without taking whole BP med so she is worried if she takes whole pill that it will bottom out her BP. Advised pt to monitor and will discuss at OV. Recommended if sx get worse before appt can go to UC and be seen. Pt verbalized understanding and will wait for appt.   Summary: Symptomatic no appt   Pt has sinus infection symptoms, head has been hurting, pressure all around her face, throat is irritated from drainage. Also irregular blood pressure readings. No appt with PCP. Seeking assistance  Best contact: [phone]) [phone]         Reason for Disposition  [1] Using nasal washes and pain medicine > 24 hours AND [2] sinus pain (around cheekbone or eye) persists  Answer Assessment - Initial Assessment Questions 1. LOCATION: "Where does it hurt?"      Head  2. ONSET: "When did the sinus pain start?"  (e.g., hours, days)      Couple of days  3. SEVERITY: "How bad is the pain?"   (Scale 1-10; mild, moderate or severe)   - MILD (1-3): doesn't interfere with normal activities    - MODERATE (4-7): interferes with normal activities (e.g., work or school) or awakens from sleep   - SEVERE (8-10): excruciating pain and patient unable to do any normal activities        Moderate  5. NASAL CONGESTION: "Is the nose blocked?" If Yes, ask: "Can you open it or must you breathe through  your mouth?"     no 6. NASAL DISCHARGE: "Do you have discharge from your nose?" If so ask, "What color?"     yes 7. FEVER: "Do you have a fever?" If Yes, ask: "What is it, how was it measured, and when did it start?"      no 8. OTHER SYMPTOMS: "Do you have any other symptoms?" (e.g., sore throat, cough, earache, difficulty breathing)     Productive cough, sore throat, sinus pressure  Protocols used: Sinus Pain or Congestion-A-AH

## 2023-08-24 ENCOUNTER — Encounter: Payer: Self-pay | Admitting: Internal Medicine

## 2023-08-24 ENCOUNTER — Ambulatory Visit (INDEPENDENT_AMBULATORY_CARE_PROVIDER_SITE_OTHER): Payer: Medicare Other | Admitting: Internal Medicine

## 2023-08-24 VITALS — BP 118/70 | HR 80 | Ht 63.0 in | Wt 172.2 lb

## 2023-08-24 DIAGNOSIS — J3089 Other allergic rhinitis: Secondary | ICD-10-CM | POA: Diagnosis not present

## 2023-08-24 DIAGNOSIS — J410 Simple chronic bronchitis: Secondary | ICD-10-CM | POA: Diagnosis not present

## 2023-08-24 MED ORDER — PREDNISONE 10 MG PO TABS
ORAL_TABLET | ORAL | 0 refills | Status: DC
Start: 2023-08-24 — End: 2023-10-18

## 2023-08-24 NOTE — Patient Instructions (Signed)

## 2023-08-24 NOTE — Progress Notes (Signed)
Subjective:    Patient ID: SIMAR ANTRIM, female    DOB: 05/09/1941, 82 y.o.   MRN: [id number]  HPI  . Discussed the use of AI scribe software for clinical note transcription with the patient, who gave verbal consent to proceed.  The patient presents with a chief complaint of nasal congestion, which she describes as feeling like her ears are full. She also reports experiencing headaches localized to the right side, over her eye, and soreness in her eyes. Accompanying these symptoms is a runny nose, which she describes as clear discharge, and a sore throat. She also notes shortness of breath, which she attributes to her pre-existing condition of COPD. The patient reports a lack of energy and has been feeling generally unwell.  These symptoms began before Thanksgiving and have persisted. She has been self-managing with over-the-counter allergy pills and Mucinex, in addition to her prescribed inhalers. She also reports being around her daughter, who has been diagnosed with a mild case of pneumonia.  The patient also reports experiencing nausea, which she believes may be due to the post-nasal drainage. She has had chills and a decreased appetite, which she believes may be contributing to her lack of energy. She has a history of smoking, but it is not clear from the conversation whether she is currently smoking.   Review of Systems  Past Medical History:  Diagnosis Date   Allergy    B12 deficiency    Chest pain syndrome    COPD (chronic obstructive pulmonary disease) (HCC)    Depression    GERD (gastroesophageal reflux disease)    Glaucoma    History of colonic polyps    History of degenerative disc disease    c-spine, status post cervical laminectomy   Hyperlipidemia    Hypertension    Hypothyroidism    Insomnia    Migraine    Restless leg syndrome    Seasonal allergies    Sleep apnea    Thyroid cancer (HCC)    Urinary incontinence     Current Outpatient Medications  Medication  Sig Dispense Refill   acetaminophen (TYLENOL) 500 MG tablet Take 500 mg by mouth every 6 (six) hours as needed.     albuterol (VENTOLIN HFA) 108 (90 Base) MCG/ACT inhaler USE 1 TO 2 INHALATIONS BY MOUTH  INTO THE LUNGS EVERY 6 HOURS AS  NEEDED FOR WHEEZING OR SHORTNESS OF BREATH 34 g 1   atorvastatin (LIPITOR) 10 MG tablet TAKE 1 TABLET BY MOUTH DAILY 100 tablet 2   Calcium Carb-Cholecalciferol (CALCIUM + D3) 600-200 MG-UNIT TABS Take by mouth.     cetirizine (ZYRTEC) 10 MG tablet Take 10 mg by mouth daily.     Cranberry 180 MG CAPS Take by mouth.     doxycycline (VIBRA-TABS) 100 MG tablet Take 1 tablet (100 mg total) by mouth 2 (two) times daily. 20 tablet 0   hydrOXYzine (ATARAX/VISTARIL) 10 MG tablet Take 1 tablet (10 mg total) by mouth daily as needed. 30 tablet 0   latanoprost (XALATAN) 0.005 % ophthalmic solution Place 1 drop into both eyes at bedtime.     mirabegron ER (MYRBETRIQ) 50 MG TB24 tablet Take 1 tablet (50 mg total) by mouth daily. 90 tablet 3   montelukast (SINGULAIR) 10 MG tablet TAKE 1 TABLET BY MOUTH AT  BEDTIME 100 tablet 2   omeprazole (PRILOSEC) 20 MG capsule TAKE 1 CAPSULE BY MOUTH DAILY 100 capsule 1   SYNTHROID 112 MCG tablet TAKE 1 TABLET BY MOUTH DAILY  BEFORE BREAKFAST 100 tablet 2   traZODone (DESYREL) 50 MG tablet Take 0.5 tablets (25 mg total) by mouth at bedtime as needed for sleep. 45 tablet 0   triamcinolone cream (KENALOG) 0.1 % Apply 1 application. topically 2 (two) times daily. 30 g 0   umeclidinium-vilanterol (ANORO ELLIPTA) 62.5-25 MCG/ACT AEPB Inhale 1 puff into the lungs daily. 30 each 11   valsartan (DIOVAN) 80 MG tablet TAKE 1 TABLET BY MOUTH DAILY 80 tablet 1   vitamin B-12 (CYANOCOBALAMIN) 1000 MCG tablet Take 1,000 mcg by mouth daily.     No current facility-administered medications for this visit.    Allergies  Allergen Reactions   Toviaz [Fesoterodine Fumarate Er] Other (See Comments)    Throat closed   Cortisone    Doxycycline Hyclate     Hydrocodone    Keflex [Cephalexin]    Septra [Sulfamethoxazole-Trimethoprim]    Tagamet [Cimetidine]    Zoloft [Sertraline]    Penicillins Rash    Family History  Problem Relation Age of Onset   Heart disease Mother    Breast cancer Neg Hx     Social History   Socioeconomic History   Marital status: Widowed    Spouse name: Not on file   Number of children: Not on file   Years of education: Not on file   Highest education level: Not on file  Occupational History   Occupation: Retired  Tobacco Use   Smoking status: Every Day    Current packs/day: 1.00    Average packs/day: 1 pack/day for 102.9 years (102.9 ttl pk-yrs)    Types: Cigarettes    Start date: 1962   Smokeless tobacco: Never  Vaping Use   Vaping status: Never Used  Substance and Sexual Activity   Alcohol use: No   Drug use: No   Sexual activity: Not on file  Other Topics Concern   Not on file  Social History Narrative   Not on file   Social Determinants of Health   Financial Resource Strain: Low Risk  (04/09/2023)   Overall Financial Resource Strain (CARDIA)    Difficulty of Paying Living Expenses: Not very hard  Food Insecurity: No Food Insecurity (04/09/2023)   Hunger Vital Sign    Worried About Running Out of Food in the Last Year: Never true    Ran Out of Food in the Last Year: Never true  Transportation Needs: No Transportation Needs (04/09/2023)   PRAPARE - Administrator, Civil Service (Medical): No    Lack of Transportation (Non-Medical): No  Physical Activity: Insufficiently Active (04/09/2023)   Exercise Vital Sign    Days of Exercise per Week: 7 days    Minutes of Exercise per Session: 10 min  Stress: No Stress Concern Present (04/09/2023)   [employer] of Occupational Health - Occupational Stress Questionnaire    Feeling of Stress : Only a little  Social Connections: Socially Isolated (04/09/2023)   Social Connection and Isolation Panel [NHANES]    Frequency of  Communication with Friends and Family: More than three times a week    Frequency of Social Gatherings with Friends and Family: More than three times a week    Attends Religious Services: Never    Database administrator or Organizations: No    Attends Banker Meetings: Never    Marital Status: Widowed  Intimate Partner Violence: Not At Risk (04/09/2023)   Humiliation, Afraid, Rape, and Kick questionnaire    Fear of Current or Ex-Partner: No  Emotionally Abused: No    Physically Abused: No    Sexually Abused: No     Constitutional: Patient reports fatigue, headache.  Denies fever, malaise, or abrupt weight changes.  HEENT: Patient reports runny nose, nasal congestion, ear fullness and sore throat.  Denies eye pain, eye redness, ear pain, ringing in the ears, wax buildup, bloody nose. Respiratory: Patient reports cough and shortness of breath.  Denies difficulty breathing, or sputum production.   Cardiovascular: Denies chest pain, chest tightness, palpitations or swelling in the hands or feet.  Gastrointestinal: Patient reports nausea.  Denies abdominal pain, bloating, constipation, diarrhea or Neurological: Patient reports insomnia.  Denies dizziness, difficulty with memory, difficulty with speech or problems with balance and coordination.    No other specific complaints in a complete review of systems (except as listed in HPI above).     Objective:   Physical Exam   BP 118/70   Pulse 80   Ht 5\' 3"  (1.6 m)   Wt 172 lb 3.2 oz (78.1 kg)   SpO2 99%   BMI 30.50 kg/m    Wt Readings from Last 3 Encounters:  07/14/23 174 lb (78.9 kg)  04/09/23 171 lb (77.6 kg)  04/06/23 171 lb 12.8 oz (77.9 kg)    General: Appears her stated age, obese in NAD. HEENT: Head: normal shape and size, no sinus tenderness noted; Eyes: sclera Diebold, no icterus, conjunctiva pink, PERRLA and EOMs intact; Throat: Mucosa pink and moist, no lesions or exudate noted.  + PND. Neck: No  noted. Cardiovascular: Normal rate and rhythm. S1,S2 noted.  No murmur, rubs or gallops noted.  Pulmonary/Chest: Normal effort and positive vesicular breath sounds. No respiratory distress. No wheezes, rales or ronchi noted.  Musculoskeletal: No difficulty with gait.  Neurological: Alert and oriented. Coordination normal.    BMET    Component Value Date/Time   NA 141 07/14/2023 0955   K 4.3 07/14/2023 0955   K 3.8 12/15/2013 1202   CL 104 07/14/2023 0955   CO2 27 07/14/2023 0955   GLUCOSE 152 (H) 07/14/2023 0955   BUN 13 07/14/2023 0955   CREATININE 1.10 (H) 07/14/2023 0955   CALCIUM 9.6 07/14/2023 0955   GFRNONAA >60 01/19/2012 0949   GFRAA >60 01/19/2012 0949    Lipid Panel     Component Value Date/Time   CHOL 152 07/14/2023 0955   TRIG 206 (H) 07/14/2023 0955   HDL 54 07/14/2023 0955   CHOLHDL 2.8 07/14/2023 0955   LDLCALC 71 07/14/2023 0955    CBC    Component Value Date/Time   WBC 6.3 07/14/2023 0955   RBC 4.31 07/14/2023 0955   HGB 13.3 07/14/2023 0955   HCT 41.0 07/14/2023 0955   PLT 135 (L) 07/14/2023 0955   MCV 95.1 07/14/2023 0955   MCH 30.9 07/14/2023 0955   MCHC 32.4 07/14/2023 0955   RDW 12.3 07/14/2023 0955   LYMPHSABS 2,310 02/10/2022 1333   EOSABS 392 02/10/2022 1333   BASOSABS 91 02/10/2022 1333    Hgb A1C Lab Results  Component Value Date   HGBA1C 6.5 (H) 07/14/2023           Assessment & Plan:    Assessment and Plan    Allergic Rhinitis Nasal congestion, rhinorrhea, sore throat, and headache for over a week. Likely secondary to allergies. No signs of bacterial infection on examination. -Start Prednisone taper. -If no improvement by Friday, consider adding an antibiotic for URI.  COPD Shortness of breath on exertion, but no acute exacerbation  on examination. -Continue current inhaler regimen.   RTC in 4 months for your annual exam Nicki Reaper, NP

## 2023-08-25 ENCOUNTER — Other Ambulatory Visit: Payer: Self-pay | Admitting: Internal Medicine

## 2023-08-25 DIAGNOSIS — J449 Chronic obstructive pulmonary disease, unspecified: Secondary | ICD-10-CM

## 2023-08-26 NOTE — Progress Notes (Unsigned)
08/26/23 2:24 PM   Brooke King [birth year] [id number]  Referring provider:  Lorre Munroe, NP [address] [place],  [place] [address]  Urological history  1. Incontinence -contributing factors of obesity, vaginal delivery, age, smoking, vaginal atrophy and sleep apnea -Myrbetriq 50 mg daily  2. 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 Vaginal atrophy  - Unable to tolerate vaginal estrogen cream   4. Cystocele  - evaluated by Dr. Dalbert Garnet (04/2022)- do not believe a pessary would stay in place, as anatomy of vaginal canal is straight from cuff to introitus- larger at opening than in the canal   HPI: Brooke King is a 82 y.o.female who presents today for 6 month follow up.  Previous records reviewed.       PMH: Past Medical History:  Diagnosis Date   Allergy    B12 deficiency    Chest pain syndrome    COPD (chronic obstructive pulmonary disease) (HCC)    Depression    GERD (gastroesophageal reflux disease)    Glaucoma    History of colonic polyps    History of degenerative disc disease    c-spine, status post cervical laminectomy   Hyperlipidemia    Hypertension    Hypothyroidism    Insomnia    Migraine    Restless leg syndrome    Seasonal allergies    Sleep apnea    Thyroid cancer (HCC)    Urinary incontinence     Surgical History: Past Surgical History:  Procedure Laterality Date   ABDOMINAL HYSTERECTOMY     CATARACT EXTRACTION, BILATERAL     CERVICAL LAMINECTOMY  08/2004   CHOLECYSTECTOMY     COLONOSCOPY WITH PROPOFOL N/A 11/18/2015   Procedure: COLONOSCOPY WITH PROPOFOL;  Surgeon: Wallace Cullens, MD;  [facility];  Service: Gastroenterology;  Laterality: N/A;   ESOPHAGOGASTRODUODENOSCOPY (EGD) WITH PROPOFOL N/A 11/18/2015   Procedure: ESOPHAGOGASTRODUODENOSCOPY (EGD) WITH PROPOFOL;  Surgeon: Wallace Cullens, MD;  Location[facility] ENDOSCOPY;  Service: Gastroenterology;  Laterality: N/A;   HEMORRHOID SURGERY     TUBAL LIGATION      Home  Medications:  Allergies as of 08/31/2023       Reactions   Toviaz [fesoterodine Fumarate Er] Other (See Comments)   Throat closed   Cortisone    Doxycycline Hyclate    Hydrocodone    Keflex [cephalexin]    Septra [sulfamethoxazole-trimethoprim]    Tagamet [cimetidine]    Zoloft [sertraline]    Penicillins Rash        Medication List        Accurate as of August 26, 2023  2:24 PM. If you have any questions, ask your nurse or doctor.          acetaminophen 500 MG tablet Commonly known as: TYLENOL Take 500 mg by mouth every 6 (six) hours as needed.   albuterol 108 (90 Base) MCG/ACT inhaler Commonly known as: VENTOLIN HFA USE 1 TO 2 INHALATIONS BY MOUTH  INTO THE LUNGS EVERY 6 HOURS AS  NEEDED FOR WHEEZING OR SHORTNESS OF BREATH   Anoro Ellipta 62.5-25 MCG/ACT Aepb Generic drug: umeclidinium-vilanterol Inhale 1 puff into the lungs daily.   atorvastatin 10 MG tablet Commonly known as: LIPITOR TAKE 1 TABLET BY MOUTH DAILY   Calcium + D3 600-200 MG-UNIT Tabs Take by mouth.   cetirizine 10 MG tablet Commonly known as: ZYRTEC Take 10 mg by mouth daily.   Cranberry 180 MG Caps Take by mouth.   cyanocobalamin 1000  MCG tablet Commonly known as: VITAMIN B12 Take 1,000 mcg by mouth daily.   hydrOXYzine 10 MG tablet Commonly known as: ATARAX Take 1 tablet (10 mg total) by mouth daily as needed.   latanoprost 0.005 % ophthalmic solution Commonly known as: XALATAN Place 1 drop into both eyes at bedtime.   mirabegron ER 50 MG Tb24 tablet Commonly known as: MYRBETRIQ Take 1 tablet (50 mg total) by mouth daily.   montelukast 10 MG tablet Commonly known as: SINGULAIR TAKE 1 TABLET BY MOUTH AT  BEDTIME   omeprazole 20 MG capsule Commonly known as: PRILOSEC TAKE 1 CAPSULE BY MOUTH DAILY   predniSONE 10 MG tablet Commonly known as: DELTASONE Take 6 tabs on day 1, 5 tabs on day 2, 4 tabs on day 3, 3 tabs on day 4, 2 tabs on day 5, 1 tab on day 6   Synthroid  112 MCG tablet Generic drug: levothyroxine TAKE 1 TABLET BY MOUTH DAILY  BEFORE BREAKFAST   traZODone 50 MG tablet Commonly known as: DESYREL Take 0.5 tablets (25 mg total) by mouth at bedtime as needed for sleep.   triamcinolone cream 0.1 % Commonly known as: KENALOG Apply 1 application. topically 2 (two) times daily.   valsartan 80 MG tablet Commonly known as: DIOVAN TAKE 1 TABLET BY MOUTH DAILY        Allergies:  Allergies  Allergen Reactions   Toviaz [Fesoterodine Fumarate Er] Other (See Comments)    Throat closed   Cortisone    Doxycycline Hyclate    Hydrocodone    Keflex [Cephalexin]    Septra [Sulfamethoxazole-Trimethoprim]    Tagamet [Cimetidine]    Zoloft [Sertraline]    Penicillins Rash    Family History: Family History  Problem Relation Age of Onset   Heart disease Mother    Breast cancer Neg Hx     Social History:  reports that she has been smoking cigarettes. She started smoking about 62 years ago. She has a 102.9 pack-year smoking history. She has never used smokeless tobacco. She reports that she does not drink alcohol and does not use drugs.   Physical Exam: There were no vitals taken for this visit.  Constitutional:  Well nourished. Alert and oriented, No acute distress. HEENT: [place] AT, moist mucus membranes.  Trachea midline Cardiovascular: No clubbing, cyanosis, or edema. Respiratory: Normal respiratory effort, no increased work of breathing. Neurologic: Grossly intact, no focal deficits, moving all 4 extremities. Psychiatric: Normal mood and affect.    Laboratory Data: Urinalysis  See EPIC and HPI I have reviewed the labs.    Pertinent Imaging:  02/02/23 09:59  Scan Result 0    Assessment & Plan:    1. Mixed Incontinence -UA benign -PVR demonstrates adequate bladder emptying  -I offered her appointment with Dr. Sherron Monday for further evaluation of her incontinence, but she stated she was tired of seeing doctors -I also offered  her a cystoscopy for further evaluation of her internal bladder anatomy to see if there is a cause for her incontinence.  Because of her smoking history, I did explain that the cystoscopy would help rule out bladder cancer -I explained how the procedure was performed and she deferred stating she did not want to go through any procedures at this time and would reconsider if her symptoms worsened  -she would like to continue the Myrbetriq -I did notice that I wrote for 90-day supply, but she filled 30 at the time  2. Cystocele  -We will manage conservatively  3. Vaginal atrophy -could not tolerate the vaginal estrogen cream  -Advised her to use olive oil as well for the discomfort  No follow-ups on file.  Cloretta Ned   [facility] [address] [place], [place] [address] [phone]

## 2023-08-27 NOTE — Telephone Encounter (Signed)
Requested Prescriptions  Pending Prescriptions Disp Refills   albuterol (VENTOLIN HFA) 108 (90 Base) MCG/ACT inhaler [Pharmacy Med Name: ALBUTEROL HFA 90MCG/ACT (PA)] 34 g 2    Sig: USE 1 TO 2 INHALATIONS BY MOUTH  INTO THE LUNGS EVERY 6 HOURS AS  NEEDED FOR WHEEZING OR SHORTNESS OF BREATH     Pulmonology:  Beta Agonists 2 Passed - 08/25/2023 11:17 PM      Passed - Last BP in normal range    BP Readings from Last 1 Encounters:  08/24/23 118/70         Passed - Last Heart Rate in normal range    Pulse Readings from Last 1 Encounters:  08/24/23 80         Passed - Valid encounter within last 12 months    Recent Outpatient Visits           3 days ago Seasonal allergic rhinitis due to other allergic trigger   [place] [facility] [place], Salvadore Oxford, NP   1 month ago Age-related osteoporosis without current pathological fracture   [place] [facility] [place], Salvadore Oxford, NP   5 months ago Simple chronic bronchitis [facility])   [place] [facility] [place], Salvadore Oxford, NP   6 months ago COPD exacerbation [facility])   [place] [facility] [place], Salvadore Oxford, NP   10 months ago Pharyngitis, unspecified etiology   [place] [facility] [place], Salvadore Oxford, NP       Future Appointments             In 4 days [place], Elana [facility] [facility] [place]   In 1 month [place], Salvadore Oxford, NP  [facility]

## 2023-08-29 ENCOUNTER — Other Ambulatory Visit: Payer: Self-pay | Admitting: Internal Medicine

## 2023-08-30 ENCOUNTER — Ambulatory Visit: Payer: Self-pay | Admitting: *Deleted

## 2023-08-30 MED ORDER — AZITHROMYCIN 250 MG PO TABS
ORAL_TABLET | ORAL | 0 refills | Status: DC
Start: 2023-08-30 — End: 2023-10-18

## 2023-08-30 NOTE — Telephone Encounter (Signed)
  Chief Complaint: worsening sore throat, headache,nasal drainage requesting medication  last day for prednisone tomorrow Symptoms: head congestion, nasal drainage clear constant down back of throat, difficulty sleeping with CPAP. Cough thick clear mucus. "Nose stopping up at night'. 08/24/23 Frequency: since  Pertinent Negatives: Patient denies chest pain no difficulty breathing no fever Disposition: [] ED /[] Urgent Care (no appt availability in office) / [] Appointment(In office/virtual)/ []  [place] [facility]/ [] Home Care/ [] Refused Recommended Disposition /[] [facility]/ [x]  Follow-up with PCP Additional Notes:   Requesting additional medication. Requesting a call back . Would like to know if medications can be given and would like to travel out one time today if possible.     Summary: sore throat / rx req   The patient has been previously seen on 08/24/23 for a sore throat and continues to experience symptoms of discomfort  The patient was instructed that if they needed an antibiotic prescription to contact their PCP  The patient shares that in-home treatments and otc medications have been ineffective  Please contact further at [phone] when possible                    Reason for Disposition  [1] Sore throat with cough/cold symptoms AND [2] present > 5 days  Answer Assessment - Initial Assessment Questions 1. ONSET: "When did the throat start hurting?" (Hours or days ago)      08/24/23 2. SEVERITY: "How bad is the sore throat?" (Scale 1-10; mild, moderate or severe)   - MILD (1-3):  Doesn't interfere with eating or normal activities.   - MODERATE (4-7): Interferes with eating some solids and normal activities.   - SEVERE (8-10):  Excruciating pain, interferes with most normal activities.   - SEVERE WITH DYSPHAGIA (10): Can't swallow liquids, drooling.     Red throat , can drink and eat. 3. STREP EXPOSURE: "Has there been any exposure to strep within  the past week?" If Yes, ask: "What type of contact occurred?"      na 4.  VIRAL SYMPTOMS: "Are there any symptoms of a cold, such as a runny nose, cough, hoarse voice or red eyes?"      Cough , some production thick mucus no color at times. Nasal drainage constant runny nose drainage down back of throat. Head congestion. Difficulty sleeping with CPAP 5. FEVER: "Do you have a fever?" If Yes, ask: "What is your temperature, how was it measured, and when did it start?"     na 6. PUS ON THE TONSILS: "Is there pus on the tonsils in the back of your throat?"     na 7. OTHER SYMPTOMS: "Do you have any other symptoms?" (e.g., difficulty breathing, headache, rash)     See above  8. PREGNANCY: "Is there any chance you are pregnant?" "When was your last menstrual period?"     na  Protocols used: Sore Throat-A-AH

## 2023-08-30 NOTE — Telephone Encounter (Signed)
Patient notified azithromycin sent to pharmacy.   Verbal understanding .

## 2023-08-30 NOTE — Addendum Note (Signed)
Addended by: Lorre Munroe on: 08/30/2023 12:46 PM   Modules accepted: Orders

## 2023-08-30 NOTE — Telephone Encounter (Signed)
Azithromycin sent to pharmacy

## 2023-08-31 ENCOUNTER — Ambulatory Visit: Payer: Medicare Other | Admitting: Urology

## 2023-08-31 NOTE — Telephone Encounter (Signed)
Requested Prescriptions  Pending Prescriptions Disp Refills   valsartan (DIOVAN) 80 MG tablet [Pharmacy Med Name: Valsartan 80 MG Oral Tablet] 100 tablet 0    Sig: TAKE 1 TABLET BY MOUTH DAILY     Cardiovascular:  Angiotensin Receptor Blockers Failed - 08/29/2023  4:39 AM      Failed - Cr in normal range and within 180 days    Creat  Date Value Ref Range Status  07/14/2023 1.10 (H) 0.60 - 0.95 mg/dL Final   Creatinine, Urine  Date Value Ref Range Status  07/14/2023 100 20 - 275 mg/dL Final         Passed - K in normal range and within 180 days    Potassium  Date Value Ref Range Status  07/14/2023 4.3 3.5 - 5.3 mmol/L Final  12/15/2013 3.8 3.5 - 5.1 mmol/L Final         Passed - Patient is not pregnant      Passed - Last BP in normal range    BP Readings from Last 1 Encounters:  08/24/23 118/70         Passed - Valid encounter within last 6 months    Recent Outpatient Visits           1 week ago Seasonal allergic rhinitis due to other allergic trigger   [place] [facility] [place], Salvadore Oxford, NP   1 month ago Age-related osteoporosis without current pathological fracture   [place] [facility] [place], Salvadore Oxford, NP   6 months ago Simple chronic bronchitis [facility])   [place] [facility] [place], Salvadore Oxford, NP   6 months ago COPD exacerbation [facility])   [place] [facility] [place], Salvadore Oxford, NP   10 months ago Pharyngitis, unspecified etiology   [place] [facility] [place], Salvadore Oxford, NP       Future Appointments             In 1 month Baity, Salvadore Oxford, NP [place] [facility], [facility]

## 2023-09-12 ENCOUNTER — Other Ambulatory Visit: Payer: Self-pay | Admitting: Internal Medicine

## 2023-09-14 NOTE — Telephone Encounter (Signed)
Requested Prescriptions  Pending Prescriptions Disp Refills   omeprazole (PRILOSEC) 20 MG capsule [Pharmacy Med Name: Omeprazole 20 MG Oral Capsule Delayed Release] 100 capsule 0    Sig: TAKE 1 CAPSULE BY MOUTH DAILY     Gastroenterology: Proton Pump Inhibitors Passed - 09/14/2023 10:35 AM      Passed - Valid encounter within last 12 months    Recent Outpatient Visits           3 weeks ago Seasonal allergic rhinitis due to other allergic trigger   [facility] [place], Salvadore Oxford, NP   2 months ago Age-related osteoporosis without current pathological fracture   [place] [facility] [place], Salvadore Oxford, NP   6 months ago Simple chronic bronchitis [facility])   [place] [facility] [place], Salvadore Oxford, NP   7 months ago COPD exacerbation [facility])   [place] [facility] [place], Salvadore Oxford, NP   11 months ago Pharyngitis, unspecified etiology   [place] [facility] [place], Salvadore Oxford, NP       Future Appointments             In 1 month Baity, Salvadore Oxford, NP [place] [facility], [facility]

## 2023-10-18 ENCOUNTER — Ambulatory Visit (INDEPENDENT_AMBULATORY_CARE_PROVIDER_SITE_OTHER): Payer: Medicare Other | Admitting: Internal Medicine

## 2023-10-18 ENCOUNTER — Encounter: Payer: Self-pay | Admitting: Internal Medicine

## 2023-10-18 VITALS — BP 112/70 | Ht 63.0 in | Wt 173.2 lb

## 2023-10-18 DIAGNOSIS — Z0001 Encounter for general adult medical examination with abnormal findings: Secondary | ICD-10-CM | POA: Diagnosis not present

## 2023-10-18 DIAGNOSIS — E039 Hypothyroidism, unspecified: Secondary | ICD-10-CM

## 2023-10-18 DIAGNOSIS — E66811 Obesity, class 1: Secondary | ICD-10-CM

## 2023-10-18 DIAGNOSIS — Z6830 Body mass index (BMI) 30.0-30.9, adult: Secondary | ICD-10-CM

## 2023-10-18 DIAGNOSIS — E1142 Type 2 diabetes mellitus with diabetic polyneuropathy: Secondary | ICD-10-CM

## 2023-10-18 DIAGNOSIS — E6609 Other obesity due to excess calories: Secondary | ICD-10-CM

## 2023-10-18 DIAGNOSIS — E1169 Type 2 diabetes mellitus with other specified complication: Secondary | ICD-10-CM

## 2023-10-18 DIAGNOSIS — E785 Hyperlipidemia, unspecified: Secondary | ICD-10-CM | POA: Diagnosis not present

## 2023-10-18 MED ORDER — HYDROXYZINE HCL 10 MG PO TABS: 10.0000 mg | ORAL_TABLET | Freq: Every day | ORAL | 0 refills | Status: DC | PRN

## 2023-10-18 NOTE — Progress Notes (Signed)
Subjective:    Patient ID: Brooke King, female    DOB: [birth year], 83 y.o.   MRN: [id number]  HPI  Patient presents to clinic today for her annual exam.  Flu: 05/2021 Tetanus: >10 years ago COVID: Never Pneumovax: 08/2021 Prevnar 13: 11/2015 Shingrix: Never Pap smear: Hysterectomy Mammogram: 04/2022 Bone density: 04/2022 Colon screening: 10/2015 Vision screening: annually Dentist: as needed  Diet: She does not eat much meat. She consumes some veggies, not many fruits. She tries to avoid fried foods. She drinks mostly water, coffee, soda. Exercise: None  Review of Systems     Past Medical History:  Diagnosis Date   Allergy    B12 deficiency    Chest pain syndrome    COPD (chronic obstructive pulmonary disease) (HCC)    Depression    GERD (gastroesophageal reflux disease)    Glaucoma    History of colonic polyps    History of degenerative disc disease    c-spine, status post cervical laminectomy   Hyperlipidemia    Hypertension    Hypothyroidism    Insomnia    Migraine    Restless leg syndrome    Seasonal allergies    Sleep apnea    Thyroid cancer (HCC)    Urinary incontinence     Current Outpatient Medications  Medication Sig Dispense Refill   acetaminophen (TYLENOL) 500 MG tablet Take 500 mg by mouth every 6 (six) hours as needed.     albuterol (VENTOLIN HFA) 108 (90 Base) MCG/ACT inhaler USE 1 TO 2 INHALATIONS BY MOUTH  INTO THE LUNGS EVERY 6 HOURS AS  NEEDED FOR WHEEZING OR SHORTNESS OF BREATH 34 g 2   atorvastatin (LIPITOR) 10 MG tablet TAKE 1 TABLET BY MOUTH DAILY 100 tablet 2   azithromycin (ZITHROMAX) 250 MG tablet Take 2 tabs today, then 1 tab daily x 4 days 6 tablet 0   Calcium Carb-Cholecalciferol (CALCIUM + D3) 600-200 MG-UNIT TABS Take by mouth.     cetirizine (ZYRTEC) 10 MG tablet Take 10 mg by mouth daily.     Cranberry 180 MG CAPS Take by mouth.     hydrOXYzine (ATARAX/VISTARIL) 10 MG tablet Take 1 tablet (10 mg total) by mouth daily as needed.  30 tablet 0   latanoprost (XALATAN) 0.005 % ophthalmic solution Place 1 drop into both eyes at bedtime.     mirabegron ER (MYRBETRIQ) 50 MG TB24 tablet Take 1 tablet (50 mg total) by mouth daily. 90 tablet 3   montelukast (SINGULAIR) 10 MG tablet TAKE 1 TABLET BY MOUTH AT  BEDTIME 100 tablet 2   omeprazole (PRILOSEC) 20 MG capsule TAKE 1 CAPSULE BY MOUTH DAILY 100 capsule 0   predniSONE (DELTASONE) 10 MG tablet Take 6 tabs on day 1, 5 tabs on day 2, 4 tabs on day 3, 3 tabs on day 4, 2 tabs on day 5, 1 tab on day 6 21 tablet 0   SYNTHROID 112 MCG tablet TAKE 1 TABLET BY MOUTH DAILY  BEFORE BREAKFAST 100 tablet 2   traZODone (DESYREL) 50 MG tablet Take 0.5 tablets (25 mg total) by mouth at bedtime as needed for sleep. 45 tablet 0   triamcinolone cream (KENALOG) 0.1 % Apply 1 application. topically 2 (two) times daily. 30 g 0   umeclidinium-vilanterol (ANORO ELLIPTA) 62.5-25 MCG/ACT AEPB Inhale 1 puff into the lungs daily. 30 each 11   valsartan (DIOVAN) 80 MG tablet TAKE 1 TABLET BY MOUTH DAILY 100 tablet 0   vitamin B-12 (CYANOCOBALAMIN) 1000 MCG  tablet Take 1,000 mcg by mouth daily.     No current facility-administered medications for this visit.    Allergies  Allergen Reactions   Toviaz [Fesoterodine Fumarate Er] Other (See Comments)    Throat closed   Cortisone    Doxycycline Hyclate    Hydrocodone    Keflex [Cephalexin]    Septra [Sulfamethoxazole-Trimethoprim]    Tagamet [Cimetidine]    Zoloft [Sertraline]    Penicillins Rash    Family History  Problem Relation Age of Onset   Heart disease Mother    Breast cancer Neg Hx     Social History   Socioeconomic History   Marital status: Widowed    Spouse name: Not on file   Number of children: Not on file   Years of education: Not on file   Highest education level: Not on file  Occupational History   Occupation: Retired  Tobacco Use   Smoking status: Every Day    Current packs/day: 1.00    Average packs/day: 1 pack/day  for 103.1 years (103.1 ttl pk-yrs)    Types: Cigarettes    Start date: 1962   Smokeless tobacco: Never  Vaping Use   Vaping status: Never Used  Substance and Sexual Activity   Alcohol use: No   Drug use: No   Sexual activity: Not on file  Other Topics Concern   Not on file  Social History Narrative   Not on file   Social Drivers of Health   Financial Resource Strain: Low Risk  (04/09/2023)   Overall Financial Resource Strain (CARDIA)    Difficulty of Paying Living Expenses: Not very hard  Food Insecurity: No Food Insecurity (04/09/2023)   Hunger Vital Sign    Worried About Running Out of Food in the Last Year: Never true    Ran Out of Food in the Last Year: Never true  Transportation Needs: No Transportation Needs (04/09/2023)   PRAPARE - Administrator, Civil Service (Medical): No    Lack of Transportation (Non-Medical): No  Physical Activity: Insufficiently Active (04/09/2023)   Exercise Vital Sign    Days of Exercise per Week: 7 days    Minutes of Exercise per Session: 10 min  Stress: No Stress Concern Present (04/09/2023)   [employer] of Occupational Health - Occupational Stress Questionnaire    Feeling of Stress : Only a little  Social Connections: Socially Isolated (04/09/2023)   Social Connection and Isolation Panel [NHANES]    Frequency of Communication with Friends and Family: More than three times a week    Frequency of Social Gatherings with Friends and Family: More than three times a week    Attends Religious Services: Never    Database administrator or Organizations: No    Attends Banker Meetings: Never    Marital Status: Widowed  Intimate Partner Violence: Not At Risk (04/09/2023)   Humiliation, Afraid, Rape, and Kick questionnaire    Fear of Current or Ex-Partner: No    Emotionally Abused: No    Physically Abused: No    Sexually Abused: No     Constitutional: Denies fever, malaise, fatigue, headache or abrupt weight changes.   HEENT: Patient reports nasal congestion.  Denies eye pain, eye redness, ear pain, ringing in the ears, wax buildup, runny nose, bloody nose, or sore throat. Respiratory: Pt reports chronic cough. Denies difficulty breathing, shortness of breath.   Cardiovascular: Denies chest pain, chest tightness, palpitations or swelling in the hands or feet.  Gastrointestinal:  Denies abdominal pain, bloating, constipation, diarrhea or blood in the stool.  GU: Patient reports incontinence.  Denies urgency, frequency, pain with urination, burning sensation, blood in urine, odor or discharge. Musculoskeletal: Denies decrease in range of motion, difficulty with gait, muscle pain or joint pain and swelling.  Skin: Denies redness, rashes, lesions or ulcercations.  Neurological: Patient reports insomnia.  Denies dizziness, difficulty with memory, difficulty with speech or problems with balance and coordination.  Psych: Patient has a history of anxiety.  Denies depression, SI/HI.  No other specific complaints in a complete review of systems (except as listed in HPI above).  Objective:   Physical Exam  BP 112/70 (BP Location: Left Arm, Patient Position: Sitting, Cuff Size: Normal)   Ht 5\' 3"  (1.6 m)   Wt 173 lb 3.2 oz (78.6 kg)   BMI 30.68 kg/m    Wt Readings from Last 3 Encounters:  08/24/23 172 lb 3.2 oz (78.1 kg)  07/14/23 174 lb (78.9 kg)  04/09/23 171 lb (77.6 kg)    General: Appears her stated age, obese, in NAD. Skin: Warm, dry and intact. No ulcerations noted. HEENT: Head: normal shape and size; Eyes: sclera Segel, no icterus, conjunctiva pink, PERRLA and EOMs intact;  Neck:  Neck supple, trachea midline. No masses, lumps or thyromegaly present.  Cardiovascular: Normal rate and rhythm. S1,S2 noted.  No murmur, rubs or gallops noted. No JVD or BLE edema. No carotid bruits noted. Pulmonary/Chest: Normal effort with bilateral expiratory wheezing noted. No respiratory distress. No rhonchi or rales  noted.  Abdomen: Normal bowel sounds.  Musculoskeletal: Strength 5/5 BUE/BLE.  No difficulty with gait.  Neurological: Alert and oriented. Cranial nerves II-XII grossly intact. Coordination normal.  Psychiatric: Mood and affect normal. Behavior is normal. Judgment and thought content normal.    BMET    Component Value Date/Time   NA 141 07/14/2023 0955   K 4.3 07/14/2023 0955   K 3.8 12/15/2013 1202   CL 104 07/14/2023 0955   CO2 27 07/14/2023 0955   GLUCOSE 152 (H) 07/14/2023 0955   BUN 13 07/14/2023 0955   CREATININE 1.10 (H) 07/14/2023 0955   CALCIUM 9.6 07/14/2023 0955   GFRNONAA >60 01/19/2012 0949   GFRAA >60 01/19/2012 0949    Lipid Panel     Component Value Date/Time   CHOL 152 07/14/2023 0955   TRIG 206 (H) 07/14/2023 0955   HDL 54 07/14/2023 0955   CHOLHDL 2.8 07/14/2023 0955   LDLCALC 71 07/14/2023 0955    CBC    Component Value Date/Time   WBC 6.3 07/14/2023 0955   RBC 4.31 07/14/2023 0955   HGB 13.3 07/14/2023 0955   HCT 41.0 07/14/2023 0955   PLT 135 (L) 07/14/2023 0955   MCV 95.1 07/14/2023 0955   MCH 30.9 07/14/2023 0955   MCHC 32.4 07/14/2023 0955   RDW 12.3 07/14/2023 0955   LYMPHSABS 2,310 02/10/2022 1333   EOSABS 392 02/10/2022 1333   BASOSABS 91 02/10/2022 1333    Hgb A1C Lab Results  Component Value Date   HGBA1C 6.5 (H) 07/14/2023           Assessment & Plan:   Preventative Health Maintenance:  Flu shot declined She declines tetanus for financial reasons, advised if she gets bit or cut to get this done Pneumovax and Prevnar UTD Encouraged her to get her COVID-vaccine Discussed Shingrix vaccine, she will check coverage with her insurance company and schedule a visit at the pharmacy if she would like to have this  done She no longer needs Pap smears She no longer wants to screen for breast cancer Bone density UTD She no longer needs to screen for colon cancer Encouraged her to consume a balanced diet and exercise  regimen Advised her seeing eye doctor and dentist annually We will check CBC, c-Met, TSH, free T4, lipid, A1c today   RTC in 6 months, follow-up chronic conditions Nicki Reaper, NP

## 2023-10-18 NOTE — Patient Instructions (Signed)
Health Maintenance for Postmenopausal Women Menopause is a normal process in which your ability to get pregnant comes to an end. This process happens slowly over many months or years, usually between the ages of 53 and 48. Menopause is complete when you have missed your menstrual period for 12 months. It is important to talk with your health care provider about some of the most common conditions that affect women after menopause (postmenopausal women). These include heart disease, cancer, and bone loss (osteoporosis). Adopting a healthy lifestyle and getting preventive care can help to promote your health and wellness. The actions you take can also lower your chances of developing some of these common conditions. What are the signs and symptoms of menopause? During menopause, you may have the following symptoms: Hot flashes. These can be moderate or severe. Night sweats. Decrease in sex drive. Mood swings. Headaches. Tiredness (fatigue). Irritability. Memory problems. Problems falling asleep or staying asleep. Talk with your health care provider about treatment options for your symptoms. Do I need hormone replacement therapy? Hormone replacement therapy is effective in treating symptoms that are caused by menopause, such as hot flashes and night sweats. Hormone replacement carries certain risks, especially as you become older. If you are thinking about using estrogen or estrogen with progestin, discuss the benefits and risks with your health care provider. How can I reduce my risk for heart disease and stroke? The risk of heart disease, heart attack, and stroke increases as you age. One of the causes may be a change in the body's hormones during menopause. This can affect how your body uses dietary fats, triglycerides, and cholesterol. Heart attack and stroke are medical emergencies. There are many things that you can do to help prevent heart disease and stroke. Watch your blood pressure High  blood pressure causes heart disease and increases the risk of stroke. This is more likely to develop in people who have high blood pressure readings or are overweight. Have your blood pressure checked: Every 3-5 years if you are 81-82 years of age. Every year if you are 70 years old or older. Eat a healthy diet  Eat a diet that includes plenty of vegetables, fruits, low-fat dairy products, and lean protein. Do not eat a lot of foods that are high in solid fats, added sugars, or sodium. Get regular exercise Get regular exercise. This is one of the most important things you can do for your health. Most adults should: Try to exercise for at least 150 minutes each week. The exercise should increase your heart rate and make you sweat (moderate-intensity exercise). Try to do strengthening exercises at least twice each week. Do these in addition to the moderate-intensity exercise. Spend less time sitting. Even light physical activity can be beneficial. Other tips Work with your health care provider to achieve or maintain a healthy weight. Do not use any products that contain nicotine or tobacco. These products include cigarettes, chewing tobacco, and vaping devices, such as e-cigarettes. If you need help quitting, ask your health care provider. Know your numbers. Ask your health care provider to check your cholesterol and your blood sugar (glucose). Continue to have your blood tested as directed by your health care provider. Do I need screening for cancer? Depending on your health history and family history, you may need to have cancer screenings at different stages of your life. This may include screening for: Breast cancer. Cervical cancer. Lung cancer. Colorectal cancer. What is my risk for osteoporosis? After menopause, you may be  at increased risk for osteoporosis. Osteoporosis is a condition in which bone destruction happens more quickly than new bone creation. To help prevent osteoporosis or  the bone fractures that can happen because of osteoporosis, you may take the following actions: If you are 25-40 years old, get at least 1,000 mg of calcium and at least 600 international units (IU) of vitamin D per day. If you are older than age 7 but younger than age 34, get at least 1,200 mg of calcium and at least 600 international units (IU) of vitamin D per day. If you are older than age 41, get at least 1,200 mg of calcium and at least 800 international units (IU) of vitamin D per day. Smoking and drinking excessive alcohol increase the risk of osteoporosis. Eat foods that are rich in calcium and vitamin D, and do weight-bearing exercises several times each week as directed by your health care provider. How does menopause affect my mental health? Depression may occur at any age, but it is more common as you become older. Common symptoms of depression include: Feeling depressed. Changes in sleep patterns. Changes in appetite or eating patterns. Feeling an overall lack of motivation or enjoyment of activities that you previously enjoyed. Frequent crying spells. Talk with your health care provider if you think that you are experiencing any of these symptoms. General instructions See your health care provider for regular wellness exams and vaccines. This may include: Scheduling regular health, dental, and eye exams. Getting and maintaining your vaccines. These include: Influenza vaccine. Get this vaccine each year before the flu season begins. Pneumonia vaccine. Shingles vaccine. Tetanus, diphtheria, and pertussis (Tdap) booster vaccine. Your health care provider may also recommend other immunizations. Tell your health care provider if you have ever been abused or do not feel safe at home. Summary Menopause is a normal process in which your ability to get pregnant comes to an end. This condition causes hot flashes, night sweats, decreased interest in sex, mood swings, headaches, or lack  of sleep. Treatment for this condition may include hormone replacement therapy. Take actions to keep yourself healthy, including exercising regularly, eating a healthy diet, watching your weight, and checking your blood pressure and blood sugar levels. Get screened for cancer and depression. Make sure that you are up to date with all your vaccines. This information is not intended to replace advice given to you by your health care provider. Make sure you discuss any questions you have with your health care provider. Document Revised: 01/27/2021 Document Reviewed: 01/27/2021 Elsevier Patient Education  2024 [employer].

## 2023-10-18 NOTE — Assessment & Plan Note (Signed)
Encouraged diet and exercise for weight loss ?

## 2023-10-19 ENCOUNTER — Encounter: Payer: Self-pay | Admitting: Internal Medicine

## 2023-10-19 LAB — COMPLETE METABOLIC PANEL WITH GFR
AG Ratio: 1.6 (calc) (ref 1.0–2.5)
ALT: 16 U/L (ref 6–29)
AST: 18 U/L (ref 10–35)
Albumin: 4.3 g/dL (ref 3.6–5.1)
Alkaline phosphatase (APISO): 50 U/L (ref 37–153)
BUN/Creatinine Ratio: 12 (calc) (ref 6–22)
BUN: 14 mg/dL (ref 7–25)
CO2: 28 mmol/L (ref 20–32)
Calcium: 9.6 mg/dL (ref 8.6–10.4)
Chloride: 103 mmol/L (ref 98–110)
Creat: 1.19 mg/dL — ABNORMAL HIGH (ref 0.60–0.95)
Globulin: 2.7 g/dL (ref 1.9–3.7)
Glucose, Bld: 105 mg/dL (ref 65–139)
Potassium: 4.1 mmol/L (ref 3.5–5.3)
Sodium: 141 mmol/L (ref 135–146)
Total Bilirubin: 0.4 mg/dL (ref 0.2–1.2)
Total Protein: 7 g/dL (ref 6.1–8.1)
eGFR: 46 mL/min/{1.73_m2} — ABNORMAL LOW (ref >=60)

## 2023-10-19 LAB — HEMOGLOBIN A1C
Hgb A1c MFr Bld: 6.4 %{Hb} — ABNORMAL HIGH (ref <5.7)
Mean Plasma Glucose: 137 mg/dL
eAG (mmol/L): 7.6 mmol/L

## 2023-10-19 LAB — CBC
HCT: 41.3 % (ref 35.0–45.0)
Hemoglobin: 13.6 g/dL (ref 11.7–15.5)
MCH: 30.8 pg (ref 27.0–33.0)
MCHC: 32.9 g/dL (ref 32.0–36.0)
MCV: 93.7 fL (ref 80.0–100.0)
MPV: 13.7 fL — ABNORMAL HIGH (ref 7.5–12.5)
Platelets: 137 10*3/uL — ABNORMAL LOW (ref 140–400)
RBC: 4.41 10*6/uL (ref 3.80–5.10)
RDW: 12 % (ref 11.0–15.0)
WBC: 6.5 10*3/uL (ref 3.8–10.8)

## 2023-10-19 LAB — LIPID PANEL
Cholesterol: 156 mg/dL (ref <200)
HDL: 54 mg/dL (ref >=50)
LDL Cholesterol (Calc): 77 mg/dL
Non-HDL Cholesterol (Calc): 102 mg/dL (ref <130)
Total CHOL/HDL Ratio: 2.9 (calc) (ref <5.0)
Triglycerides: 155 mg/dL — ABNORMAL HIGH (ref <150)

## 2023-10-19 LAB — T4, FREE: Free T4: 1.3 ng/dL (ref 0.8–1.8)

## 2023-10-19 LAB — TSH: TSH: 1.16 m[IU]/L (ref 0.40–4.50)

## 2023-11-21 ENCOUNTER — Other Ambulatory Visit: Payer: Self-pay | Admitting: Internal Medicine

## 2023-11-23 NOTE — Telephone Encounter (Signed)
 Requested Prescriptions  Pending Prescriptions Disp Refills   omeprazole (PRILOSEC) 20 MG capsule [Pharmacy Med Name: Omeprazole 20 MG Oral Capsule Delayed Release] 100 capsule 0    Sig: TAKE 1 CAPSULE BY MOUTH DAILY     Gastroenterology: Proton Pump Inhibitors Passed - 11/23/2023 10:40 AM      Passed - Valid encounter within last 12 months    Recent Outpatient Visits           1 month ago Encounter for general adult medical examination with abnormal findings   [place] [facility] [place], [place] W, NP   3 months ago Seasonal allergic rhinitis due to other allergic trigger   [place] [facility] [place], Salvadore Oxford, NP   4 months ago Age-related osteoporosis without current pathological fracture   [place] [facility] [place], Salvadore Oxford, NP   8 months ago Simple chronic bronchitis [facility])   [place] [facility] [place], Salvadore Oxford, NP   9 months ago COPD exacerbation [facility])   [place] [facility] [place], Salvadore Oxford, NP       Future Appointments             In 4 months Baity, Salvadore Oxford, NP [place] [facility]

## 2023-11-25 ENCOUNTER — Other Ambulatory Visit: Payer: Self-pay | Admitting: Internal Medicine

## 2023-11-25 NOTE — Telephone Encounter (Signed)
 Requested Prescriptions  Pending Prescriptions Disp Refills   valsartan (DIOVAN) 80 MG tablet [Pharmacy Med Name: Valsartan 80 MG Oral Tablet] 100 tablet 1    Sig: TAKE 1 TABLET BY MOUTH DAILY     Cardiovascular:  Angiotensin Receptor Blockers Failed - 11/25/2023  3:51 PM      Failed - Cr in normal range and within 180 days    Creat  Date Value Ref Range Status  10/18/2023 1.19 (H) 0.60 - 0.95 mg/dL Final   Creatinine, Urine  Date Value Ref Range Status  07/14/2023 100 20 - 275 mg/dL Final         Passed - K in normal range and within 180 days    Potassium  Date Value Ref Range Status  10/18/2023 4.1 3.5 - 5.3 mmol/L Final  12/15/2013 3.8 3.5 - 5.1 mmol/L Final         Passed - Patient is not pregnant      Passed - Last BP in normal range    BP Readings from Last 1 Encounters:  10/18/23 112/70         Passed - Valid encounter within last 6 months    Recent Outpatient Visits           1 month ago Encounter for general adult medical examination with abnormal findings   [place] [facility] [place], Salvadore Oxford, NP   3 months ago Seasonal allergic rhinitis due to other allergic trigger   [place] [facility] [place], Salvadore Oxford, NP   4 months ago Age-related osteoporosis without current pathological fracture   [place] [facility] [place], Salvadore Oxford, NP   8 months ago Simple chronic bronchitis [facility])   [place] [facility] [place], Salvadore Oxford, NP   9 months ago COPD exacerbation [facility])   [place] [facility] [place], Salvadore Oxford, NP       Future Appointments             In 4 months Baity, Salvadore Oxford, NP [place] [facility], [facility]

## 2024-01-25 LAB — HM DIABETES EYE EXAM

## 2024-01-31 ENCOUNTER — Other Ambulatory Visit: Payer: Self-pay | Admitting: Internal Medicine

## 2024-02-02 NOTE — Telephone Encounter (Signed)
 LOV 10/18/2023  Requested Prescriptions  Pending Prescriptions Disp Refills   omeprazole  (PRILOSEC) 20 MG capsule [Pharmacy Med Name: Omeprazole  20 MG Oral Capsule Delayed Release] 100 capsule 1    Sig: TAKE 1 CAPSULE BY MOUTH DAILY     Gastroenterology: Proton Pump Inhibitors Failed - 02/02/2024  1:47 PM      Failed - Valid encounter within last 12 months    Recent Outpatient Visits   None     Future Appointments             In 2 months Baity, Rankin Buzzard, NP  [facility], [facility]

## 2024-02-19 ENCOUNTER — Other Ambulatory Visit: Payer: Self-pay | Admitting: Internal Medicine

## 2024-02-21 NOTE — Telephone Encounter (Signed)
 Requested Prescriptions  Pending Prescriptions Disp Refills   montelukast  (SINGULAIR ) 10 MG tablet [Pharmacy Med Name: Montelukast  Sodium 10 MG Oral Tablet] 100 tablet 0    Sig: TAKE 1 TABLET BY MOUTH AT  BEDTIME     Pulmonology:  Leukotriene Inhibitors Failed - 02/21/2024  3:42 PM      Failed - Valid encounter within last 12 months    Recent Outpatient Visits   None     Future Appointments             In 1 month Baity, Rankin Buzzard, NP [place] [facility], [facility]

## 2024-02-23 ENCOUNTER — Encounter: Payer: Self-pay | Admitting: Internal Medicine

## 2024-02-23 ENCOUNTER — Ambulatory Visit (INDEPENDENT_AMBULATORY_CARE_PROVIDER_SITE_OTHER): Admitting: Internal Medicine

## 2024-02-23 VITALS — BP 110/68 | HR 73 | Ht 63.0 in | Wt 172.8 lb

## 2024-02-23 DIAGNOSIS — F5101 Primary insomnia: Secondary | ICD-10-CM | POA: Diagnosis not present

## 2024-02-23 DIAGNOSIS — J209 Acute bronchitis, unspecified: Secondary | ICD-10-CM

## 2024-02-23 DIAGNOSIS — M810 Age-related osteoporosis without current pathological fracture: Secondary | ICD-10-CM | POA: Diagnosis not present

## 2024-02-23 DIAGNOSIS — J440 Chronic obstructive pulmonary disease with acute lower respiratory infection: Secondary | ICD-10-CM | POA: Diagnosis not present

## 2024-02-23 DIAGNOSIS — J019 Acute sinusitis, unspecified: Secondary | ICD-10-CM

## 2024-02-23 DIAGNOSIS — B9689 Other specified bacterial agents as the cause of diseases classified elsewhere: Secondary | ICD-10-CM

## 2024-02-23 MED ORDER — HYDROXYZINE HCL 10 MG PO TABS: 10.0000 mg | ORAL_TABLET | Freq: Every day | ORAL | 0 refills | Status: AC | PRN

## 2024-02-23 MED ORDER — TRAZODONE HCL 50 MG PO TABS
25.0000 mg | ORAL_TABLET | Freq: Every evening | ORAL | 0 refills | Status: DC | PRN
Start: 2024-02-23 — End: 2024-05-24

## 2024-02-23 MED ORDER — PREDNISONE 10 MG PO TABS: ORAL_TABLET | ORAL | 0 refills | Status: DC

## 2024-02-23 MED ORDER — AZITHROMYCIN 250 MG PO TABS: ORAL_TABLET | ORAL | 0 refills | Status: DC

## 2024-02-23 NOTE — Progress Notes (Signed)
 Subjective:    Patient ID: Brooke King, female    DOB: 1941-07-10, 83 y.o.   MRN: [id number]  HPI  Discussed the use of AI scribe software for clinical note transcription with the patient, who gave verbal consent to proceed.  Brooke King is an 83 year old female who presents with sinus congestion and wheezing.  She has been experiencing sinus congestion, drainage, and rhinorrhea for about a week, accompanied by sinus pressure and throat irritation due to drainage. She is blowing clear mucous out of her nose but the cough is productive of yellow mucous. She typically experiences allergies during this time of year, which may contribute to her symptoms. She has been outdoors in the yard, which might have exacerbated her condition. She has a history of COPD managed on Anoro and Albuterol . She has not had sick contacts that she is aware of.  She experiences wheezing, which started this morning, and has shortness of breath. She also has chills but no fever, nausea, vomiting, or diarrhea. She has been using tylenol for symptom relief.  She has a history of osteoporosis and previously received injections for treatment from Dr. Cecil Code. She is not currently on any medication for osteoporosis due to dental issues and reports bone deterioration as noted by her dentist. She experiences pain when on her feet for extended periods, requiring Tylenol and heat application for relief.  She describes a history of dental issues, including having teeth extracted in February, followed by an infection in her gum. She was on antibiotics prior to the procedure and later worked a piece of tooth out of her gum. She is reluctant to return to the dentist.     She is also requesting refills of trazodone  and hydroxyzine .  Review of Systems   Past Medical History:  Diagnosis Date   Allergy    B12 deficiency    Chest pain syndrome    COPD (chronic obstructive pulmonary disease) (HCC)    Depression    GERD  (gastroesophageal reflux disease)    Glaucoma    History of colonic polyps    History of degenerative disc disease    c-spine, status post cervical laminectomy   Hyperlipidemia    Hypertension    Hypothyroidism    Insomnia    Migraine    Restless leg syndrome    Seasonal allergies    Sleep apnea    Thyroid  cancer (HCC)    Urinary incontinence     Current Outpatient Medications  Medication Sig Dispense Refill   acetaminophen (TYLENOL) 500 MG tablet Take 500 mg by mouth every 6 (six) hours as needed.     albuterol  (VENTOLIN  HFA) 108 (90 Base) MCG/ACT inhaler USE 1 TO 2 INHALATIONS BY MOUTH  INTO THE LUNGS EVERY 6 HOURS AS  NEEDED FOR WHEEZING OR SHORTNESS OF BREATH 34 g 2   atorvastatin  (LIPITOR) 10 MG tablet TAKE 1 TABLET BY MOUTH DAILY 100 tablet 2   Calcium  Carb-Cholecalciferol (CALCIUM  + D3) 600-200 MG-UNIT TABS Take by mouth.     cetirizine (ZYRTEC) 10 MG tablet Take 10 mg by mouth daily.     Cranberry 180 MG CAPS Take by mouth.     hydrOXYzine  (ATARAX ) 10 MG tablet Take 1 tablet (10 mg total) by mouth daily as needed. 30 tablet 0   latanoprost (XALATAN) 0.005 % ophthalmic solution Place 1 drop into both eyes at bedtime.     mirabegron  ER (MYRBETRIQ ) 50 MG TB24 tablet Take 1 tablet (50 mg total)  by mouth daily. 90 tablet 3   montelukast  (SINGULAIR ) 10 MG tablet TAKE 1 TABLET BY MOUTH AT  BEDTIME 100 tablet 0   omeprazole  (PRILOSEC) 20 MG capsule TAKE 1 CAPSULE BY MOUTH DAILY 100 capsule 1   SYNTHROID  112 MCG tablet TAKE 1 TABLET BY MOUTH DAILY  BEFORE BREAKFAST 100 tablet 2   traZODone  (DESYREL ) 50 MG tablet Take 0.5 tablets (25 mg total) by mouth at bedtime as needed for sleep. 45 tablet 0   triamcinolone  cream (KENALOG ) 0.1 % Apply 1 application. topically 2 (two) times daily. (Patient not taking: Reported on 10/18/2023) 30 g 0   umeclidinium-vilanterol (ANORO ELLIPTA ) 62.5-25 MCG/ACT AEPB Inhale 1 puff into the lungs daily. 30 each 11   valsartan  (DIOVAN ) 80 MG tablet TAKE 1  TABLET BY MOUTH DAILY 100 tablet 1   vitamin B-12 (CYANOCOBALAMIN) 1000 MCG tablet Take 1,000 mcg by mouth daily.     No current facility-administered medications for this visit.    Allergies  Allergen Reactions   Toviaz  [Fesoterodine  Fumarate Er] Other (See Comments)    Throat closed   Cortisone    Doxycycline  Hyclate    Hydrocodone    Keflex [Cephalexin]    Septra [Sulfamethoxazole-Trimethoprim]    Tagamet [Cimetidine]    Zoloft [Sertraline]    Penicillins Rash    Family History  Problem Relation Age of Onset   Heart disease Mother    Breast cancer Neg Hx     Social History   Socioeconomic History   Marital status: Widowed    Spouse name: Not on file   Number of children: Not on file   Years of education: Not on file   Highest education level: Not on file  Occupational History   Occupation: Retired  Tobacco Use   Smoking status: Every Day    Current packs/day: 1.00    Average packs/day: 1 pack/day for 103.4 years (103.4 ttl pk-yrs)    Types: Cigarettes    Start date: 1962   Smokeless tobacco: Never  Vaping Use   Vaping status: Never Used  Substance and Sexual Activity   Alcohol use: No   Drug use: No   Sexual activity: Not on file  Other Topics Concern   Not on file  Social History Narrative   Not on file   Social Drivers of Health   Financial Resource Strain: Low Risk  (04/09/2023)   Overall Financial Resource Strain (CARDIA)    Difficulty of Paying Living Expenses: Not very hard  Food Insecurity: No Food Insecurity (04/09/2023)   Hunger Vital Sign    Worried About Running Out of Food in the Last Year: Never true    Ran Out of Food in the Last Year: Never true  Transportation Needs: No Transportation Needs (04/09/2023)   PRAPARE - Administrator, Civil Service (Medical): No    Lack of Transportation (Non-Medical): No  Physical Activity: Insufficiently Active (04/09/2023)   Exercise Vital Sign    Days of Exercise per Week: 7 days     Minutes of Exercise per Session: 10 min  Stress: No Stress Concern Present (04/09/2023)   [employer] of Occupational Health - Occupational Stress Questionnaire    Feeling of Stress : Only a little  Social Connections: Socially Isolated (04/09/2023)   Social Connection and Isolation Panel [NHANES]    Frequency of Communication with Friends and Family: More than three times a week    Frequency of Social Gatherings with Friends and Family: More than three times  a week    Attends Religious Services: Never    Active Member of Clubs or Organizations: No    Attends Banker Meetings: Never    Marital Status: Widowed  Intimate Partner Violence: Not At Risk (04/09/2023)   Humiliation, Afraid, Rape, and Kick questionnaire    Fear of Current or Ex-Partner: No    Emotionally Abused: No    Physically Abused: No    Sexually Abused: No     Constitutional: Pt reports fatigue and chills. Denies fever, malaise, headache or abrupt weight changes.  HEENT: Pt reports sinus pressure, runny nose and sore throat. Denies eye pain, eye redness, ear pain, ringing in the ears, wax buildup, nasal congestion, bloody nose, or sore throat. Respiratory: Pt reports cough, shortness of breath. Denies difficulty breathing.   Cardiovascular: Denies chest pain, chest tightness, palpitations or swelling in the hands or feet.  Gastrointestinal: Denies abdominal pain, bloating, constipation, diarrhea or blood in the stool.  Musculoskeletal: Denies decrease in range of motion, difficulty with gait, muscle pain or joint pain and swelling.  Skin: Denies redness, rashes, lesions or ulcercations.  Neurological: Pt reports insomnia. Denies dizziness, difficulty with memory, difficulty with speech or problems with balance and coordination.    No other specific complaints in a complete review of systems (except as listed in HPI above).      Objective:   Physical Exam BP 110/68 (BP Location: Left Arm, Patient  Position: Sitting, Cuff Size: Normal)   Pulse 73   Ht 5\' 3"  (1.6 m)   Wt 172 lb 12.8 oz (78.4 kg)   SpO2 99%   BMI 30.61 kg/m   Wt Readings from Last 3 Encounters:  10/18/23 173 lb 3.2 oz (78.6 kg)  08/24/23 172 lb 3.2 oz (78.1 kg)  07/14/23 174 lb (78.9 kg)    General: Appears her stated age, obese, in NAD. Skin: Warm, dry and intact.  HEENT: Head: normal shape and size, maxillary sinus tenderness noted; Eyes: sclera [place], no icterus, conjunctiva pink, PERRLA and EOMs intact; Nose: mucosa pink and moist, septum midline; Throat/Mouth: Teeth present, mucosa pink and moist, no exudate, lesions or ulcerations noted.  Neck:  No adenopathy noted. Cardiovascular: Normal rate and rhythm.  Pulmonary/Chest: Normal effort and positive vesicular breath sounds with bilateral inspiratory and expiratory wheezing. No respiratory distress. No rales or ronchi noted.  Neurological: Alert and oriented.   BMET    Component Value Date/Time   NA 141 10/18/2023 1007   K 4.1 10/18/2023 1007   K 3.8 12/15/2013 1202   CL 103 10/18/2023 1007   CO2 28 10/18/2023 1007   GLUCOSE 105 10/18/2023 1007   BUN 14 10/18/2023 1007   CREATININE 1.19 (H) 10/18/2023 1007   CALCIUM  9.6 10/18/2023 1007   GFRNONAA >60 01/19/2012 0949   GFRAA >60 01/19/2012 0949    Lipid Panel     Component Value Date/Time   CHOL 156 10/18/2023 1007   TRIG 155 (H) 10/18/2023 1007   HDL 54 10/18/2023 1007   CHOLHDL 2.9 10/18/2023 1007   LDLCALC 77 10/18/2023 1007    CBC    Component Value Date/Time   WBC 6.5 10/18/2023 1007   RBC 4.41 10/18/2023 1007   HGB 13.6 10/18/2023 1007   HCT 41.3 10/18/2023 1007   PLT 137 (L) 10/18/2023 1007   MCV 93.7 10/18/2023 1007   MCH 30.8 10/18/2023 1007   MCHC 32.9 10/18/2023 1007   RDW 12.0 10/18/2023 1007   LYMPHSABS 2,310 02/10/2022 1333  EOSABS 392 02/10/2022 1333   BASOSABS 91 02/10/2022 1333    Hgb A1C Lab Results  Component Value Date   HGBA1C 6.4 (H) 10/18/2023             Assessment & Plan:  Assessment and Plan    Sinusitis and Bronchitis Symptoms and wheezing suggest sinusitis and bronchitis, likely exacerbated by allergies and possible CPAP issues. - Prescribed prednisone  taper for 6 days for wheezing. - Prescribed azithromycin  (Z-Pak) for sinusitis and bronchitis. -Continue inhalers as previously prescribed  Osteoporosis Osteoporosis with previous infusions. Not on medication due to dental issues and concerns about bone deterioration. Experiences pain with prolonged standing or activities. - Referred to endocrinologist for osteoporosis management and potential resumption of infusions.      RTC in 1 month for follow-up of chronic conditions Helayne Lo, NP

## 2024-02-23 NOTE — Patient Instructions (Signed)
 Acute Bronchitis, Adult  Acute bronchitis is when air tubes in the lungs (bronchi) suddenly get swollen. The condition can make it hard for you to breathe. In adults, acute bronchitis usually goes away within 2 weeks. A cough caused by bronchitis may last up to 3 weeks. Smoking, allergies, and asthma can make the condition worse. What are the causes? Germs that cause cold and flu (viruses). The most common cause of this condition is the virus that causes the common cold. Bacteria. Substances that bother (irritate) the lungs, including: Smoke from cigarettes and other types of tobacco. Dust and pollen. Fumes from chemicals, gases, or burned fuel. Indoor or outdoor air pollution. What increases the risk? A weak body's defense system. This is also called the immune system. Any condition that affects your lungs and breathing, such as asthma. What are the signs or symptoms? A cough. Coughing up clear, yellow, or green mucus. Making high-pitched whistling sounds when you breathe, most often when you breathe out (wheezing). Runny or stuffy nose. Having too much mucus in your lungs (chest congestion). Shortness of breath. Body aches. A sore throat. How is this treated? Acute bronchitis may go away over time without treatment. Your doctor may tell you to: Drink more fluids. This will help thin your mucus so it is easier to cough up. Use a device that gets medicine into your lungs (inhaler). Use a vaporizer or a humidifier. These are machines that add water to the air. This helps with coughing and poor breathing. Take a medicine that thins mucus and helps clear it from your lungs. Take a medicine that prevents or stops coughing. It is not common to take an antibiotic medicine for this condition. Follow these instructions at home:  Take over-the-counter and prescription medicines only as told by your doctor. Use an inhaler, vaporizer, or humidifier as told by your doctor. Take two teaspoons  (10 mL) of honey at bedtime. This helps lessen your coughing at night. Drink enough fluid to keep your pee (urine) pale yellow. Do not smoke or use any products that contain nicotine or tobacco. If you need help quitting, ask your doctor. Get a lot of rest. Return to your normal activities when your doctor says that it is safe. Keep all follow-up visits. How is this prevented?  Wash your hands often with soap and water for at least 20 seconds. If you cannot use soap and water, use hand sanitizer. Avoid contact with people who have cold symptoms. Try not to touch your mouth, nose, or eyes with your hands. Avoid breathing in smoke or chemical fumes. Make sure to get the flu shot every year. Contact a doctor if: Your symptoms do not get better in 2 weeks. You have trouble coughing up the mucus. Your cough keeps you awake at night. You have a fever. Get help right away if: You cough up blood. You have chest pain. You have very bad shortness of breath. You faint or keep feeling like you are going to faint. You have a very bad headache. Your fever or chills get worse. These symptoms may be an emergency. Get help right away. Call your local emergency services (911 in the [place] Do not wait to see if the symptoms will go away. Do not drive yourself to the hospital. Summary Acute bronchitis is when air tubes in the lungs (bronchi) suddenly get swollen. In adults, acute bronchitis usually goes away within 2 weeks. Drink more fluids. This will help thin your mucus so it is easier  to cough up. Take over-the-counter and prescription medicines only as told by your doctor. Contact a doctor if your symptoms do not improve after 2 weeks of treatment. This information is not intended to replace advice given to you by your health care provider. Make sure you discuss any questions you have with your health care provider. Document Revised: 01/08/2021 Document Reviewed: 01/08/2021 Elsevier Patient  Education  2024 [employer].

## 2024-03-08 ENCOUNTER — Other Ambulatory Visit: Payer: Self-pay | Admitting: Internal Medicine

## 2024-03-08 DIAGNOSIS — E039 Hypothyroidism, unspecified: Secondary | ICD-10-CM

## 2024-03-10 NOTE — Telephone Encounter (Signed)
 TSH in date.   LOV 10/18/2023  Requested Prescriptions  Pending Prescriptions Disp Refills   SYNTHROID  112 MCG tablet [Pharmacy Med Name: SYNTHROID    TAB] 100 tablet 2    Sig: TAKE 1 TABLET BY MOUTH DAILY  BEFORE BREAKFAST     Endocrinology:  Hypothyroid Agents Failed - 03/10/2024  3:21 PM      Failed - Valid encounter within last 12 months    Recent Outpatient Visits           2 weeks ago Acute bacterial sinusitis   [facility] [place], Rankin Buzzard, NP       Future Appointments             In 1 month Baity, Rankin Buzzard, NP [facility]            Passed - TSH in normal range and within 360 days    TSH  Date Value Ref Range Status  10/18/2023 1.16 0.40 - 4.50 mIU/L Final

## 2024-04-12 ENCOUNTER — Ambulatory Visit (INDEPENDENT_AMBULATORY_CARE_PROVIDER_SITE_OTHER): Admitting: Internal Medicine

## 2024-04-12 ENCOUNTER — Other Ambulatory Visit: Payer: Self-pay | Admitting: Internal Medicine

## 2024-04-12 ENCOUNTER — Encounter: Payer: Self-pay | Admitting: Internal Medicine

## 2024-04-12 ENCOUNTER — Ambulatory Visit: Payer: Self-pay | Admitting: Internal Medicine

## 2024-04-12 VITALS — BP 122/70 | Ht 63.0 in | Wt 168.0 lb

## 2024-04-12 DIAGNOSIS — E039 Hypothyroidism, unspecified: Secondary | ICD-10-CM | POA: Diagnosis not present

## 2024-04-12 DIAGNOSIS — I700 Atherosclerosis of aorta: Secondary | ICD-10-CM

## 2024-04-12 DIAGNOSIS — F419 Anxiety disorder, unspecified: Secondary | ICD-10-CM

## 2024-04-12 DIAGNOSIS — M810 Age-related osteoporosis without current pathological fracture: Secondary | ICD-10-CM

## 2024-04-12 DIAGNOSIS — I10 Essential (primary) hypertension: Secondary | ICD-10-CM | POA: Diagnosis not present

## 2024-04-12 DIAGNOSIS — J410 Simple chronic bronchitis: Secondary | ICD-10-CM

## 2024-04-12 DIAGNOSIS — K219 Gastro-esophageal reflux disease without esophagitis: Secondary | ICD-10-CM

## 2024-04-12 DIAGNOSIS — E1142 Type 2 diabetes mellitus with diabetic polyneuropathy: Secondary | ICD-10-CM | POA: Diagnosis not present

## 2024-04-12 DIAGNOSIS — E785 Hyperlipidemia, unspecified: Secondary | ICD-10-CM

## 2024-04-12 DIAGNOSIS — G4733 Obstructive sleep apnea (adult) (pediatric): Secondary | ICD-10-CM

## 2024-04-12 DIAGNOSIS — Z6829 Body mass index (BMI) 29.0-29.9, adult: Secondary | ICD-10-CM

## 2024-04-12 DIAGNOSIS — N393 Stress incontinence (female) (male): Secondary | ICD-10-CM

## 2024-04-12 DIAGNOSIS — E1169 Type 2 diabetes mellitus with other specified complication: Secondary | ICD-10-CM

## 2024-04-12 DIAGNOSIS — D696 Thrombocytopenia, unspecified: Secondary | ICD-10-CM

## 2024-04-12 DIAGNOSIS — R350 Frequency of micturition: Secondary | ICD-10-CM | POA: Diagnosis not present

## 2024-04-12 DIAGNOSIS — M1990 Unspecified osteoarthritis, unspecified site: Secondary | ICD-10-CM | POA: Insufficient documentation

## 2024-04-12 DIAGNOSIS — F5101 Primary insomnia: Secondary | ICD-10-CM

## 2024-04-12 DIAGNOSIS — M170 Bilateral primary osteoarthritis of knee: Secondary | ICD-10-CM

## 2024-04-12 LAB — POCT URINE DIPSTICK
Bilirubin, UA: NEGATIVE
Blood, UA: NEGATIVE
Glucose, UA: NEGATIVE mg/dL
Ketones, POC UA: NEGATIVE mg/dL
Leukocytes, UA: NEGATIVE
Nitrite, UA: NEGATIVE
POC PROTEIN,UA: NEGATIVE
Spec Grav, UA: <=1.005 — AB (ref 1.010–1.025)
Urobilinogen, UA: 0.2 U/dL
pH, UA: 6 (ref 5.0–8.0)

## 2024-04-12 NOTE — Assessment & Plan Note (Signed)
TSH and free T4 today We will adjust Synthroid if needed based on labs

## 2024-04-12 NOTE — Assessment & Plan Note (Signed)
 Having low blood pressures on valsartan  80 mg, will discontinue at this time Reinforced DASH diet and exercise for weight loss C-Met today

## 2024-04-12 NOTE — Assessment & Plan Note (Signed)
 No longer taking Myrbetriq  50 mg daily due to ineffectiveness, will discontinue We will monitor symptoms for now

## 2024-04-12 NOTE — Assessment & Plan Note (Signed)
 Encourage weight loss as this can help reduce sleep apnea symptoms Continue CPAP use

## 2024-04-12 NOTE — Progress Notes (Signed)
 Subjective:    Patient ID: Brooke King, female    DOB: Mar 30, 1941, 83 y.o.   MRN: [id number]  HPI  Patient presents to clinic today for follow-up of chronic conditions.  HTN: Her BP today is 120/70.  She is no longer taking valsartan . ECG from 11/2013 reviewed.  HLD with aortic atherosclerosis: Her last LDL was 77, triglycerides 844, 09/2023.  She denies myalgias on atorvastatin .  She is not taking aspirin due to thrombocytopenia and epistaxsis.  She does not consume a low-fat diet.  Thrombocytopenia: Her last platelet count was 137, 09/2023.  She is not taking aspirin.  She does not follow with hematology.  DM2 with peripheral neuropathy: Her last A1c was 6.4%, 09/2023.  She is not taking any oral diabetic medication at this time. She does not check her sugars.  She checks her feet routinely.  Her last eye exam was 06/2023.  Flu 05/2021.  Pneumovax 08/2021.  Prevnar 11/2015.  COVID never.  GERD: She is not sure what triggers this.  She has occasional breakthrough on omeprazole , and if this occurs she typically takes an additional omeprazole  at bedtime.  Upper GI from 10/2015 reviewed.  COPD: She reports chronic cough or shortness of breath.  She is taking montelukast , anoro and albuterol  as prescribed.  There are no PFTs on file.  She does smoke.  She follows with pulmonology.  OSA: She averages 6 hours of sleep per night with the use of her CPAP. She reports she recently got a new CPAP machine. Sleep study from 04/2015 reviewed.  Hypothyroidism: Status post thyroidectomy secondary to thyroid  cancer.  She denies any issues on her current dose of synthroid .  She does not follow with endocrinology.  Osteoporosis: She is taking calcium  and vitamin D OTC. She has been on reclast  in the past. She does not get much weightbearing exercise.  Bone density from 04/2022 reviewed.  OAB: She reports mainly urgency, frequency and incontinence.  She is no longer taking myrbetriq  because she did not feel like it  was effective.  She does not follow with urology.  Insomnia: She has difficulty staying asleep.  She is taking trazodone  as prescribed.  Sleep study from 04/2015 reviewed.    Anxiety: Situational.  She is taking hydroxyzine  as needed.  She is not currently seeing a therapist.  She denies depression, SI/HI.  OA: Mainly in her knees. She reports she received a gel injection in her right knee. She is following with orthopedics.  Review of Systems  Past Medical History:  Diagnosis Date   Allergy    B12 deficiency    Chest pain syndrome    COPD (chronic obstructive pulmonary disease) (HCC)    Depression    GERD (gastroesophageal reflux disease)    Glaucoma    History of colonic polyps    History of degenerative disc disease    c-spine, status post cervical laminectomy   Hyperlipidemia    Hypertension    Hypothyroidism    Insomnia    Migraine    Restless leg syndrome    Seasonal allergies    Sleep apnea    Thyroid  cancer (HCC)    Urinary incontinence     Current Outpatient Medications  Medication Sig Dispense Refill   acetaminophen (TYLENOL) 500 MG tablet Take 500 mg by mouth every 6 (six) hours as needed.     albuterol  (VENTOLIN  HFA) 108 (90 Base) MCG/ACT inhaler USE 1 TO 2 INHALATIONS BY MOUTH  INTO THE LUNGS EVERY 6 HOURS AS  NEEDED FOR WHEEZING OR SHORTNESS OF BREATH 34 g 2   atorvastatin  (LIPITOR) 10 MG tablet TAKE 1 TABLET BY MOUTH DAILY 100 tablet 2   azithromycin  (ZITHROMAX ) 250 MG tablet Take 2 tabs today, then 1 tab daily x 4 days 6 tablet 0   Calcium  Carb-Cholecalciferol (CALCIUM  + D3) 600-200 MG-UNIT TABS Take by mouth.     cetirizine (ZYRTEC) 10 MG tablet Take 10 mg by mouth daily.     Cranberry 180 MG CAPS Take by mouth.     hydrOXYzine  (ATARAX ) 10 MG tablet Take 1 tablet (10 mg total) by mouth daily as needed. 30 tablet 0   ipratropium (ATROVENT) 0.03 % nasal spray Place into both nostrils.     latanoprost (XALATAN) 0.005 % ophthalmic solution Place 1 drop into  both eyes at bedtime.     mirabegron  ER (MYRBETRIQ ) 50 MG TB24 tablet Take 1 tablet (50 mg total) by mouth daily. 90 tablet 3   montelukast  (SINGULAIR ) 10 MG tablet TAKE 1 TABLET BY MOUTH AT  BEDTIME (Patient not taking: Reported on 02/23/2024) 100 tablet 0   omeprazole  (PRILOSEC) 20 MG capsule TAKE 1 CAPSULE BY MOUTH DAILY 100 capsule 1   predniSONE  (DELTASONE ) 10 MG tablet Take 6 tabs on day 1, 5 tabs on day 2, 4 tabs on day 3, 3 tabs on day 4, 2 tabs on day 5, 1 tab on day 6 21 tablet 0   SYNTHROID  112 MCG tablet TAKE 1 TABLET BY MOUTH DAILY  BEFORE BREAKFAST 100 tablet 2   traZODone  (DESYREL ) 50 MG tablet Take 0.5 tablets (25 mg total) by mouth at bedtime as needed for sleep. 45 tablet 0   triamcinolone  cream (KENALOG ) 0.1 % Apply 1 application. topically 2 (two) times daily. (Patient not taking: Reported on 02/23/2024) 30 g 0   umeclidinium-vilanterol (ANORO ELLIPTA ) 62.5-25 MCG/ACT AEPB Inhale 1 puff into the lungs daily. 30 each 11   valsartan  (DIOVAN ) 80 MG tablet TAKE 1 TABLET BY MOUTH DAILY 100 tablet 1   vitamin B-12 (CYANOCOBALAMIN) 1000 MCG tablet Take 1,000 mcg by mouth daily.     No current facility-administered medications for this visit.    Allergies  Allergen Reactions   Toviaz  [Fesoterodine  Fumarate Er] Other (See Comments)    Throat closed   Cortisone    Doxycycline  Hyclate    Hydrocodone    Keflex [Cephalexin]    Septra [Sulfamethoxazole-Trimethoprim]    Tagamet [Cimetidine]    Zoloft [Sertraline]    Penicillins Rash    Family History  Problem Relation Age of Onset   Heart disease Mother    Breast cancer Neg Hx     Social History   Socioeconomic History   Marital status: Widowed    Spouse name: Not on file   Number of children: Not on file   Years of education: Not on file   Highest education level: Not on file  Occupational History   Occupation: Retired  Tobacco Use   Smoking status: Every Day    Current packs/day: 1.00    Average packs/day: 1  pack/day for 103.6 years (103.6 ttl pk-yrs)    Types: Cigarettes    Start date: 1962   Smokeless tobacco: Never  Vaping Use   Vaping status: Never Used  Substance and Sexual Activity   Alcohol use: No   Drug use: No   Sexual activity: Not on file  Other Topics Concern   Not on file  Social History Narrative   Not on file  Social Drivers of Corporate investment banker Strain: Low Risk  (02/25/2024)   Received from [facility]   Overall Financial Resource Strain (CARDIA)    Difficulty of Paying Living Expenses: Not hard at all  Food Insecurity: No Food Insecurity (02/25/2024)   [facility]   Hunger Vital Sign    Within the past 12 months, you worried that your food would run out before you got the money to buy more.: Never true    Within the past 12 months, the food you bought just didn't last and you didn't have money to get more.: Never true  Transportation Needs: No Transportation Needs (02/25/2024)   Received from [facility] - Transportation    In the past 12 months, has lack of transportation kept you from medical appointments or from getting medications?: No    Lack of Transportation (Non-Medical): No  Physical Activity: Insufficiently Active (04/09/2023)   Exercise Vital Sign    Days of Exercise per Week: 7 days    Minutes of Exercise per Session: 10 min  Stress: No Stress Concern Present (04/09/2023)   [employer] of Occupational Health - Occupational Stress Questionnaire    Feeling of Stress : Only a little  Social Connections: Socially Isolated (04/09/2023)   Social Connection and Isolation Panel    Frequency of Communication with Friends and Family: More than three times a week    Frequency of Social Gatherings with Friends and Family: More than three times a week    Attends Religious Services: Never    Database administrator or Organizations: No    Attends Banker Meetings:  Never    Marital Status: Widowed  Intimate Partner Violence: Not At Risk (04/09/2023)   Humiliation, Afraid, Rape, and Kick questionnaire    Fear of Current or Ex-Partner: No    Emotionally Abused: No    Physically Abused: No    Sexually Abused: No     Constitutional: Denies fever, malaise, fatigue, headache or abrupt weight changes.  HEENT: Pt reports runny nose. Denies eye pain, eye redness, ear pain, ringing in the ears, wax buildup, nasal congestion, bloody nose, or sore throat. Respiratory: Pt reports chronic cough and shortness of breath. Denies difficulty breathing, or sputum production.   Cardiovascular: Denies chest pain, chest tightness, palpitations or swelling in the hands or feet.  Gastrointestinal: Denies abdominal pain, bloating, constipation, diarrhea or blood in the stool.  GU: Patient reports urgency frequency and incontinence.  Denies pain with urination, burning sensation, blood in urine, odor or discharge. Musculoskeletal: Pt reports knee pain. Denies decrease in range of motion, difficulty with gait, muscle pain or joint swelling.  Skin: Denies redness, rashes lesions or ulcercations.  Neurological: Patient reports insomnia, neuropathic pain.  Denies dizziness, difficulty with memory, difficulty with speech or problems with balance and coordination.  Psych: Patient has a history of anxiety.  Denies depression, SI/HI.  No other specific complaints in a complete review of systems (except as listed in HPI above).     Objective:   Physical Exam  BP 122/70 (BP Location: Left Arm, Patient Position: Sitting, Cuff Size: Normal)   Ht 5' 3 (1.6 m)   Wt 168 lb (76.2 kg)   BMI 29.76 kg/m    Wt Readings from Last 3 Encounters:  02/23/24 172 lb 12.8 oz (78.4 kg)  10/18/23 173 lb 3.2 oz (78.6 kg)  08/24/23 172 lb 3.2 oz (78.1 kg)  General: Appears her stated age, overweight, in NAD. Skin: Warm, dry and intact. No ulcerations noted. HEENT: Head: normal shape and  size; Eyes: sclera Tarquinio, no icterus, conjunctiva pink, PERRLA and EOMs intact;  Neck:  Neck supple, trachea midline. No masses, lumps present.  Cardiovascular: Normal rate and rhythm. S1,S2 noted.  No murmur, rubs or gallops noted. No JVD or BLE edema. No carotid bruits noted. Pulmonary/Chest: Normal effort and positive vesicular breath sounds. No respiratory distress. No wheezes, rales or ronchi noted.  Abdomen: Soft and nontender. Normal bowel sounds.  Musculoskeletal: Kyphotic.  Joint enlargement noted in bilateral knees with effusion of the right knee.  No difficulty with gait.  Neurological: Alert and oriented. Coordination normal.  Psychiatric: Mood and affect normal. Behavior is normal. Judgment and thought content normal.    BMET    Component Value Date/Time   NA 141 10/18/2023 1007   K 4.1 10/18/2023 1007   K 3.8 12/15/2013 1202   CL 103 10/18/2023 1007   CO2 28 10/18/2023 1007   GLUCOSE 105 10/18/2023 1007   BUN 14 10/18/2023 1007   CREATININE 1.19 (H) 10/18/2023 1007   CALCIUM  9.6 10/18/2023 1007   GFRNONAA >60 01/19/2012 0949   GFRAA >60 01/19/2012 0949    Lipid Panel     Component Value Date/Time   CHOL 156 10/18/2023 1007   TRIG 155 (H) 10/18/2023 1007   HDL 54 10/18/2023 1007   CHOLHDL 2.9 10/18/2023 1007   LDLCALC 77 10/18/2023 1007    CBC    Component Value Date/Time   WBC 6.5 10/18/2023 1007   RBC 4.41 10/18/2023 1007   HGB 13.6 10/18/2023 1007   HCT 41.3 10/18/2023 1007   PLT 137 (L) 10/18/2023 1007   MCV 93.7 10/18/2023 1007   MCH 30.8 10/18/2023 1007   MCHC 32.9 10/18/2023 1007   RDW 12.0 10/18/2023 1007   LYMPHSABS 2,310 02/10/2022 1333   EOSABS 392 02/10/2022 1333   BASOSABS 91 02/10/2022 1333    Hgb A1C Lab Results  Component Value Date   HGBA1C 6.4 (H) 10/18/2023           Assessment & Plan:   Urinary frequency:  Urinalysis normal  RTC in 6 months for your annual exam Angeline Laura, NP

## 2024-04-12 NOTE — Assessment & Plan Note (Signed)
 Avoid foods that trigger reflux Encourage weight loss as this can reduce reflux symptoms Continue omeprazole  20 mg daily Okay to take additional 20 mg if needed in the evening

## 2024-04-12 NOTE — Assessment & Plan Note (Addendum)
 Continue hydroxyzine  10 mg daily as needed Support offered

## 2024-04-12 NOTE — Assessment & Plan Note (Signed)
 C-Met and lipid profile today Encouraged her to consume a low-fat diet Continue atorvastatin  10 mg daily Not taking aspirin due to thrombocytopenia and nosebleeds

## 2024-04-12 NOTE — Assessment & Plan Note (Signed)
Continue calcium and vitamin D She declines referral to endocrinology for medication management at this time Encouraged daily weightbearing exercise

## 2024-04-12 NOTE — Assessment & Plan Note (Signed)
 Encourage smoking cessation Continue montelukast  10 mg daily, anoro 62.5-25 mcg per actuation daily and albuterol  as needed She will continue to follow with pulmonology

## 2024-04-12 NOTE — Assessment & Plan Note (Signed)
 Continue trazodone  25 mg at bedtime We will monitor

## 2024-04-12 NOTE — Assessment & Plan Note (Signed)
 C-Met and lipid profile today Encouraged her to consume a low-fat diet Continue atorvastatin  10 mg daily

## 2024-04-12 NOTE — Assessment & Plan Note (Signed)
 CBC today.

## 2024-04-12 NOTE — Assessment & Plan Note (Signed)
 Encouraged diet and exercise for weight loss ?

## 2024-04-12 NOTE — Assessment & Plan Note (Signed)
 Encourage weight loss as this can help reduce joint pain She will continue to follow with orthopedics at this time

## 2024-04-12 NOTE — Assessment & Plan Note (Signed)
 A1c today Urine microalbumin has been checked within the last year Encouraged low-carb diet and exercise for weight loss Not medicated Encourage routine eye exam Encouraged routine foot exam She declines flu shot Pneumovax and Prevnar UTD

## 2024-04-12 NOTE — Patient Instructions (Signed)

## 2024-04-13 ENCOUNTER — Telehealth: Payer: Self-pay

## 2024-04-13 LAB — CBC
HCT: 42.2 % (ref 35.0–45.0)
Hemoglobin: 13.4 g/dL (ref 11.7–15.5)
MCH: 30.3 pg (ref 27.0–33.0)
MCHC: 31.8 g/dL — ABNORMAL LOW (ref 32.0–36.0)
MCV: 95.5 fL (ref 80.0–100.0)
MPV: 13.3 fL — ABNORMAL HIGH (ref 7.5–12.5)
Platelets: 133 Thousand/uL — ABNORMAL LOW (ref 140–400)
RBC: 4.42 Million/uL (ref 3.80–5.10)
RDW: 13.1 % (ref 11.0–15.0)
WBC: 7.7 Thousand/uL (ref 3.8–10.8)

## 2024-04-13 LAB — LIPID PANEL
Cholesterol: 151 mg/dL (ref <200)
HDL: 53 mg/dL (ref >=50)
LDL Cholesterol (Calc): 77 mg/dL
Non-HDL Cholesterol (Calc): 98 mg/dL (ref <130)
Total CHOL/HDL Ratio: 2.8 (calc) (ref <5.0)
Triglycerides: 130 mg/dL (ref <150)

## 2024-04-13 LAB — T4, FREE: Free T4: 1.3 ng/dL (ref 0.8–1.8)

## 2024-04-13 LAB — COMPREHENSIVE METABOLIC PANEL WITH GFR
AG Ratio: 1.7 (calc) (ref 1.0–2.5)
ALT: 13 U/L (ref 6–29)
AST: 19 U/L (ref 10–35)
Albumin: 4.3 g/dL (ref 3.6–5.1)
Alkaline phosphatase (APISO): 50 U/L (ref 37–153)
BUN: 11 mg/dL (ref 7–25)
CO2: 27 mmol/L (ref 20–32)
Calcium: 9.7 mg/dL (ref 8.6–10.4)
Chloride: 105 mmol/L (ref 98–110)
Creat: 0.87 mg/dL (ref 0.60–0.95)
Globulin: 2.6 g/dL (ref 1.9–3.7)
Glucose, Bld: 133 mg/dL — ABNORMAL HIGH (ref 65–99)
Potassium: 4.2 mmol/L (ref 3.5–5.3)
Sodium: 142 mmol/L (ref 135–146)
Total Bilirubin: 0.4 mg/dL (ref 0.2–1.2)
Total Protein: 6.9 g/dL (ref 6.1–8.1)
eGFR: 66 mL/min/1.73m2 (ref >=60)

## 2024-04-13 LAB — HEMOGLOBIN A1C
Hgb A1c MFr Bld: 6.6 % — ABNORMAL HIGH (ref <5.7)
Mean Plasma Glucose: 143 mg/dL
eAG (mmol/L): 7.9 mmol/L

## 2024-04-13 LAB — TSH: TSH: 0.79 m[IU]/L (ref 0.40–4.50)

## 2024-04-13 NOTE — Telephone Encounter (Signed)
 Copied from CRM [id number]. Topic: Clinical - Lab/Test Results >> Apr 13, 2024  9:45 AM Winona R wrote: Pt calling about her labs. I read to her the urinalysis notes however she would like to know the results from her other labs, including her tyroids results

## 2024-04-13 NOTE — Telephone Encounter (Signed)
 As soon as I review them, I will send her a MyChart message with the results and my comments

## 2024-04-14 NOTE — Telephone Encounter (Signed)
 Requested Prescriptions  Pending Prescriptions Disp Refills   atorvastatin  (LIPITOR) 10 MG tablet [Pharmacy Med Name: Atorvastatin  Calcium  10 MG Oral Tablet] 100 tablet 2    Sig: TAKE 1 TABLET BY MOUTH DAILY     Cardiovascular:  Antilipid - Statins Failed - 04/14/2024 11:28 AM      Failed - Lipid Panel in normal range within the last 12 months    Cholesterol  Date Value Ref Range Status  04/12/2024 151 <200 mg/dL Final   LDL Cholesterol (Calc)  Date Value Ref Range Status  04/12/2024 77 mg/dL (calc) Final    Comment:    Reference range: <100 . Desirable range <100 mg/dL for primary prevention;   <70 mg/dL for patients with CHD or diabetic patients  with > or = 2 CHD risk factors. SABRA LDL-C is now calculated using the Martin-Hopkins  calculation, which is a validated novel method providing  better accuracy than the Friedewald equation in the  estimation of LDL-C.  Gladis APPLETHWAITE et al. SANDREA. 7986;689(80): 2061-2068  (http://education.[facility].com/faq/FAQ164)    HDL  Date Value Ref Range Status  04/12/2024 53 > OR = 50 mg/dL Final   Triglycerides  Date Value Ref Range Status  04/12/2024 130 <150 mg/dL Final         Passed - Patient is not pregnant      Passed - Valid encounter within last 12 months    Recent Outpatient Visits           2 days ago Type 2 diabetes mellitus with peripheral neuropathy [facility])   [facility] [place], Angeline ORN, NP   1 month ago Acute bacterial sinusitis   [place] [place], Angeline ORN, [place]

## 2024-04-16 ENCOUNTER — Other Ambulatory Visit: Payer: Self-pay | Admitting: Internal Medicine

## 2024-04-17 ENCOUNTER — Ambulatory Visit: Payer: Medicare Other | Admitting: Internal Medicine

## 2024-04-17 NOTE — Telephone Encounter (Signed)
 Requested Prescriptions  Refused Prescriptions Disp Refills   valsartan  (DIOVAN ) 80 MG tablet [Pharmacy Med Name: Valsartan  80 MG Oral Tablet] 100 tablet 2    Sig: TAKE 1 TABLET BY MOUTH DAILY     Cardiovascular:  Angiotensin Receptor Blockers Passed - 04/17/2024  4:09 PM      Passed - Cr in normal range and within 180 days    Creat  Date Value Ref Range Status  04/12/2024 0.87 0.60 - 0.95 mg/dL Final   Creatinine, Urine  Date Value Ref Range Status  07/14/2023 100 20 - 275 mg/dL Final         Passed - K in normal range and within 180 days    Potassium  Date Value Ref Range Status  04/12/2024 4.2 3.5 - 5.3 mmol/L Final  12/15/2013 3.8 3.5 - 5.1 mmol/L Final         Passed - Patient is not pregnant      Passed - Last BP in normal range    BP Readings from Last 1 Encounters:  04/12/24 122/70         Passed - Valid encounter within last 6 months    Recent Outpatient Visits           5 days ago Type 2 diabetes mellitus with peripheral neuropathy [facility])   [place] [facility] [place], Angeline ORN, NP   1 month ago Acute bacterial sinusitis   [facility] [place], Angeline ORN, [place]

## 2024-04-21 ENCOUNTER — Ambulatory Visit: Payer: Medicare Other

## 2024-04-21 DIAGNOSIS — Z0000 Encounter for general adult medical examination without abnormal findings: Secondary | ICD-10-CM

## 2024-04-21 NOTE — Patient Instructions (Addendum)
 Ms. Brooke King , Thank you for taking time out of your busy schedule to complete your Annual Wellness Visit with me. I enjoyed our conversation and look forward to speaking with you again next year. I, as well as your care team,  appreciate your ongoing commitment to your health goals. Please review the following plan we discussed and let me know if I can assist you in the future.    Follow up Visits: 05/04/25 @ 10:10 AM BY PHONE We will see or speak with you next year for your Next Medicare AWV with our clinical staff Have you seen your provider in the last 6 months (3 months if uncontrolled diabetes)? Yes  Clinician Recommendations:  Aim for 30 minutes of exercise or brisk walking, 6-8 glasses of water, and 5 servings of fruits and vegetables each day. TAKE CARE!      This is a list of the screenings recommended for you:  Health Maintenance  Topic Date Due   Zoster (Shingles) Vaccine (1 of 2) Never done   Mammogram  04/23/2023   Flu Shot  04/21/2024   DTaP/Tdap/Td vaccine (1 - Tdap) 10/17/2024*   Yearly kidney health urinalysis for diabetes  07/13/2024   Complete foot exam   07/13/2024   Hemoglobin A1C  10/13/2024   Eye exam for diabetics  01/24/2025   Yearly kidney function blood test for diabetes  04/12/2025   Medicare Annual Wellness Visit  04/21/2025   Pneumococcal Vaccine for age over 69  Completed   DEXA scan (bone density measurement)  Completed   Hepatitis B Vaccine  Aged Out   HPV Vaccine  Aged Out   Meningitis B Vaccine  Aged Out   COVID-19 Vaccine  Discontinued  *Topic was postponed. The date shown is not the original due date.    Advanced directives: (ACP Link)Information on Advanced Care Planning can be found at [place]  Secretary of [facility] Advance Health Care Directives Advance Health Care Directives. [email or web address]  Advance Care Planning is important because it:  [x]  Makes sure you receive the medical care that is consistent with your values, goals, and  preferences  [x]  It provides guidance to your family and loved ones and reduces their decisional burden about whether or not they are making the right decisions based on your wishes.  Follow the link provided in your after visit summary or read over the paperwork we have mailed to you to help you started getting your Advance Directives in place. If you need assistance in completing these, please reach out to us  so that we can help you!

## 2024-04-21 NOTE — Progress Notes (Signed)
 Subjective:   Brooke King is a 83 y.o. who presents for a Medicare Wellness preventive visit.  As a reminder, Annual Wellness Visits don't include a physical exam, and some assessments may be limited, especially if this visit is performed virtually. We may recommend an in-person follow-up visit with your provider if needed.  Visit Complete: Virtual I connected with  Aletta Edmunds Dinneen on 04/21/24 by a audio enabled telemedicine application and verified that I am speaking with the correct person using two identifiers.  Patient Location: Home  Provider Location: Home Office  I discussed the limitations of evaluation and management by telemedicine. The patient expressed understanding and agreed to proceed.  Vital Signs: Because this visit was a virtual/telehealth visit, some criteria may be missing or patient reported. Any vitals not documented were not able to be obtained and vitals that have been documented are patient reported.  VideoDeclined- This patient declined Librarian, academic. Therefore the visit was completed with audio only.  Persons Participating in Visit: Patient.  AWV Questionnaire: No: Patient Medicare AWV questionnaire was not completed prior to this visit.  Cardiac Risk Factors include: advanced age (>38men, >62 women);hypertension;dyslipidemia;smoking/ tobacco exposure     Objective:    There were no vitals filed for this visit. There is no height or weight on file to calculate BMI.     04/21/2024    1:30 PM 04/09/2023    3:30 PM 11/18/2015   10:08 AM  Advanced Directives  Does Patient Have a Medical Advance Directive? No No Yes   Would patient like information on creating a medical advance directive? No - Patient declined No - Patient declined      Data saved with a previous flowsheet row definition    Current Medications (verified) Outpatient Encounter Medications as of 04/21/2024  Medication Sig   acetaminophen (TYLENOL) 500 MG  tablet Take 500 mg by mouth every 6 (six) hours as needed.   albuterol  (VENTOLIN  HFA) 108 (90 Base) MCG/ACT inhaler USE 1 TO 2 INHALATIONS BY MOUTH  INTO THE LUNGS EVERY 6 HOURS AS  NEEDED FOR WHEEZING OR SHORTNESS OF BREATH   atorvastatin  (LIPITOR) 10 MG tablet TAKE 1 TABLET BY MOUTH DAILY   Calcium  Carb-Cholecalciferol (CALCIUM  + D3) 600-200 MG-UNIT TABS Take by mouth.   cetirizine (ZYRTEC) 10 MG tablet Take 10 mg by mouth daily.   Cranberry 180 MG CAPS Take by mouth.   hydrOXYzine  (ATARAX ) 10 MG tablet Take 1 tablet (10 mg total) by mouth daily as needed.   ipratropium (ATROVENT) 0.03 % nasal spray Place into both nostrils.   latanoprost (XALATAN) 0.005 % ophthalmic solution Place 1 drop into both eyes at bedtime.   montelukast  (SINGULAIR ) 10 MG tablet TAKE 1 TABLET BY MOUTH AT  BEDTIME   omeprazole  (PRILOSEC) 20 MG capsule TAKE 1 CAPSULE BY MOUTH DAILY   POTASSIUM PO Take by mouth daily.   SYNTHROID  112 MCG tablet TAKE 1 TABLET BY MOUTH DAILY  BEFORE BREAKFAST   traZODone  (DESYREL ) 50 MG tablet Take 0.5 tablets (25 mg total) by mouth at bedtime as needed for sleep.   umeclidinium-vilanterol (ANORO ELLIPTA ) 62.5-25 MCG/ACT AEPB Inhale 1 puff into the lungs daily.   vitamin B-12 (CYANOCOBALAMIN) 1000 MCG tablet Take 1,000 mcg by mouth daily.   No facility-administered encounter medications on file as of 04/21/2024.    Allergies (verified) Toviaz  [fesoterodine  fumarate er], Cortisone, Doxycycline  hyclate, Hydrocodone, Keflex [cephalexin], Septra [sulfamethoxazole-trimethoprim], Tagamet [cimetidine], Zoloft [sertraline], and Penicillins   History: Past Medical  History:  Diagnosis Date   Allergy    B12 deficiency    Chest pain syndrome    COPD (chronic obstructive pulmonary disease) (HCC)    Depression    GERD (gastroesophageal reflux disease)    Glaucoma    History of colonic polyps    History of degenerative disc disease    c-spine, status post cervical laminectomy   Hyperlipidemia     Hypertension    Hypothyroidism    Insomnia    Migraine    Restless leg syndrome    Seasonal allergies    Sleep apnea    Thyroid  cancer (HCC)    Urinary incontinence    Past Surgical History:  Procedure Laterality Date   ABDOMINAL HYSTERECTOMY     CATARACT EXTRACTION, BILATERAL     CERVICAL LAMINECTOMY  08/2004   CHOLECYSTECTOMY     COLONOSCOPY WITH PROPOFOL  N/A 11/18/2015   Procedure: COLONOSCOPY WITH PROPOFOL ;  Surgeon: Deward CINDERELLA Piedmont, MD;  Location: [facility] ENDOSCOPY;  Service: Gastroenterology;  Laterality: N/A;   ESOPHAGOGASTRODUODENOSCOPY (EGD) WITH PROPOFOL  N/A 11/18/2015   Procedure: ESOPHAGOGASTRODUODENOSCOPY (EGD) WITH PROPOFOL ;  Surgeon: Deward CINDERELLA Piedmont, MD;  Location: [facility] ENDOSCOPY;  Service: Gastroenterology;  Laterality: N/A;   HEMORRHOID SURGERY     TUBAL LIGATION     Family History  Problem Relation Age of Onset   Heart disease Mother    Breast cancer Neg Hx    Social History   Socioeconomic History   Marital status: Widowed    Spouse name: Not on file   Number of children: Not on file   Years of education: Not on file   Highest education level: Not on file  Occupational History   Occupation: Retired  Tobacco Use   Smoking status: Every Day    Current packs/day: 1.00    Average packs/day: 1 pack/day for 103.6 years (103.6 ttl pk-yrs)    Types: Cigarettes    Start date: 1962   Smokeless tobacco: Never  Vaping Use   Vaping status: Never Used  Substance and Sexual Activity   Alcohol use: No   Drug use: No   Sexual activity: Not on file  Other Topics Concern   Not on file  Social History Narrative   Not on file   Social Drivers of Health   Financial Resource Strain: Low Risk  (04/21/2024)   Overall Financial Resource Strain (CARDIA)    Difficulty of Paying Living Expenses: Not very hard  Food Insecurity: No Food Insecurity (04/21/2024)   Hunger Vital Sign    Worried About Running Out of Food in the Last Year: Never true    Ran Out of Food in the Last Year:  Never true  Transportation Needs: No Transportation Needs (04/21/2024)   PRAPARE - Administrator, Civil Service (Medical): No    Lack of Transportation (Non-Medical): No  Physical Activity: Insufficiently Active (04/21/2024)   Exercise Vital Sign    Days of Exercise per Week: 7 days    Minutes of Exercise per Session: 20 min  Stress: No Stress Concern Present (04/21/2024)   [employer] of Occupational Health - Occupational Stress Questionnaire    Feeling of Stress: Only a little  Social Connections: Socially Isolated (04/21/2024)   Social Connection and Isolation Panel    Frequency of Communication with Friends and Family: More than three times a week    Frequency of Social Gatherings with Friends and Family: Once a week    Attends Religious Services: Never    Active  Member of Clubs or Organizations: No    Attends Banker Meetings: Never    Marital Status: Widowed    Tobacco Counseling Ready to quit: Not Answered Counseling given: Not Answered    Clinical Intake:  Pre-visit preparation completed: Yes  Pain : No/denies pain     BMI - recorded: 29.8 Nutritional Status: BMI 25 -29 Overweight Nutritional Risks: None Diabetes: No  Lab Results  Component Value Date   HGBA1C 6.6 (H) 04/12/2024   HGBA1C 6.4 (H) 10/18/2023   HGBA1C 6.5 (H) 07/14/2023     How often do you need to have someone help you when you read instructions, pamphlets, or other written materials from your doctor or pharmacy?: 1 - Never  Interpreter Needed?: No  Information entered by :: JHONNIE DAS, LPN   Activities of Daily Living     04/21/2024    1:31 PM  In your present state of health, do you have any difficulty performing the following activities:  Hearing? 0  Vision? 0  Difficulty concentrating or making decisions? 0  Walking or climbing stairs? 1  Dressing or bathing? 0  Doing errands, shopping? 0  Preparing Food and eating ? N  Using the Toilet? N  In the  past six months, have you accidently leaked urine? Y  Do you have problems with loss of bowel control? N  Managing your Medications? N  Managing your Finances? N  Housekeeping or managing your Housekeeping? N    Patient Care Team: Antonette Angeline ORN, NP as PCP - General (Internal Medicine) Mittie Gaskin, MD as Referring Physician (Ophthalmology)  I have updated your Care Teams any recent Medical Services you may have received from other providers in the past year.     Assessment:   This is a routine wellness examination for Latishia.  Hearing/Vision screen Hearing Screening - Comments:: NO AIDS Vision Screening - Comments:: READERS- [facility]   Goals Addressed             This Visit's Progress    DIET - INCREASE WATER INTAKE         Depression Screen     04/21/2024    1:25 PM 04/12/2024    9:47 AM 08/24/2023   11:52 AM 04/09/2023    3:29 PM 02/16/2023   11:17 AM 10/08/2022   11:43 AM 09/09/2022    9:43 AM  PHQ 2/9 Scores  PHQ - 2 Score 1 0 2 0 1 0 0  PHQ- 9 Score 2 8 10  0       Fall Risk     04/21/2024    1:31 PM 04/12/2024    9:47 AM 08/24/2023   11:49 AM 04/09/2023    3:31 PM 02/16/2023   11:17 AM  Fall Risk   Falls in the past year? 0 0 0 0 0  Number falls in past yr: 0   0   Injury with Fall? 0   0 0  Risk for fall due to : No Fall Risks   No Fall Risks No Fall Risks  Follow up Falls evaluation completed;Falls prevention discussed   Falls prevention discussed;Falls evaluation completed     MEDICARE RISK AT HOME:  Medicare Risk at Home Any stairs in or around the home?: Yes If so, are there any without handrails?: No Home free of loose throw rugs in walkways, pet beds, electrical cords, etc?: Yes Adequate lighting in your home to reduce risk of falls?: Yes Life alert?: No Use of a cane,  walker or w/c?: No Grab bars in the bathroom?: No Shower chair or bench in shower?: Yes Elevated toilet seat or a handicapped toilet?: Yes  TIMED UP AND GO:  Was  the test performed?  No  Cognitive Function: 6CIT completed        04/21/2024    1:33 PM 04/09/2023    3:32 PM 03/09/2022   10:07 AM  6CIT Screen  What Year? 0 points 0 points 0 points  What month? 0 points 0 points 0 points  What time? 0 points 0 points 0 points  Count back from 20 0 points 0 points 0 points  Months in reverse 0 points 0 points 0 points  Repeat phrase 2 points 2 points 4 points  Total Score 2 points 2 points 4 points    Immunizations Immunization History  Administered Date(s) Administered   Fluad Quad(high Dose 65+) 06/10/2021   Fluad Trivalent(High Dose 65+) 09/27/2017   Influenza Inj Mdck Quad Pf 06/20/2018, 06/22/2019   Influenza-Unspecified 07/03/2015, 06/11/2016   Pneumococcal Conjugate-13 12/10/2015   Pneumococcal Polysaccharide-23 06/01/2014, 09/04/2021    Screening Tests Health Maintenance  Topic Date Due   Zoster Vaccines- Shingrix (1 of 2) Never done   MAMMOGRAM  04/23/2023   INFLUENZA VACCINE  04/21/2024   DTaP/Tdap/Td (1 - Tdap) 10/17/2024 (Originally 03/26/1960)   Diabetic kidney evaluation - Urine ACR  07/13/2024   FOOT EXAM  07/13/2024   HEMOGLOBIN A1C  10/13/2024   OPHTHALMOLOGY EXAM  01/24/2025   Diabetic kidney evaluation - eGFR measurement  04/12/2025   Medicare Annual Wellness (AWV)  04/21/2025   Pneumococcal Vaccine: 50+ Years  Completed   DEXA SCAN  Completed   Hepatitis B Vaccines  Aged Out   HPV VACCINES  Aged Out   Meningococcal B Vaccine  Aged Out   COVID-19 Vaccine  Discontinued    Health Maintenance  Health Maintenance Due  Topic Date Due   Zoster Vaccines- Shingrix (1 of 2) Never done   MAMMOGRAM  04/23/2023   INFLUENZA VACCINE  04/21/2024   Health Maintenance Items Addressed: DECLINES REFERRALS FOR MAMMOGRAM & BDS; AGED OUT OF COLONOSCOPY; UP TO DATE ON PNA- DOESN'T WANT COVIDS  Additional Screening:  Vision Screening: Recommended annual ophthalmology exams for early detection of glaucoma and other disorders of  the eye. Would you like a referral to an eye doctor? No    Dental Screening: Recommended annual dental exams for proper oral hygiene  Community Resource Referral / Chronic Care Management: CRR required this visit?  No   CCM required this visit?  No   Plan:    I have personally reviewed and noted the following in the patient's chart:   Medical and social history Use of alcohol, tobacco or illicit drugs  Current medications and supplements including opioid prescriptions. Patient is not currently taking opioid prescriptions. Functional ability and status Nutritional status Physical activity Advanced directives List of other physicians Hospitalizations, surgeries, and ER visits in previous 12 months Vitals Screenings to include cognitive, depression, and falls Referrals and appointments  In addition, I have reviewed and discussed with patient certain preventive protocols, quality metrics, and best practice recommendations. A written personalized care plan for preventive services as well as general preventive health recommendations were provided to patient.   Jhonnie GORMAN Das, LPN   09/28/7972   After Visit Summary: (MyChart) Due to this being a telephonic visit, the after visit summary with patients personalized plan was offered to patient via MyChart   Notes: Nothing significant to  report at this time.

## 2024-04-27 ENCOUNTER — Other Ambulatory Visit: Payer: Self-pay | Admitting: Internal Medicine

## 2024-04-27 DIAGNOSIS — J449 Chronic obstructive pulmonary disease, unspecified: Secondary | ICD-10-CM

## 2024-04-29 NOTE — Telephone Encounter (Signed)
 Requested Prescriptions  Pending Prescriptions Disp Refills   albuterol  (VENTOLIN  HFA) 108 (90 Base) MCG/ACT inhaler [Pharmacy Med Name: ALBUTEROL  HFA 90MCG/ACT (PA)] 34 g 2    Sig: USE 1 TO 2 INHALATIONS BY MOUTH  INTO THE LUNGS EVERY 6 HOURS AS  NEEDED FOR WHEEZING OR SHORTNESS OF BREATH     Pulmonology:  Beta Agonists 2 Passed - 04/29/2024  9:14 PM      Passed - Last BP in normal range    BP Readings from Last 1 Encounters:  04/12/24 122/70         Passed - Last Heart Rate in normal range    Pulse Readings from Last 1 Encounters:  02/23/24 73         Passed - Valid encounter within last 12 months    Recent Outpatient Visits           2 weeks ago Type 2 diabetes mellitus with peripheral neuropathy [facility])   [place] [facility] [place], Angeline ORN, NP   2 months ago Acute bacterial sinusitis   [place] [facility] [place], Angeline ORN, [place]

## 2024-05-02 ENCOUNTER — Other Ambulatory Visit: Payer: Self-pay | Admitting: Student in an Organized Health Care Education/Training Program

## 2024-05-02 DIAGNOSIS — J410 Simple chronic bronchitis: Secondary | ICD-10-CM

## 2024-05-02 MED ORDER — UMECLIDINIUM-VILANTEROL 62.5-25 MCG/ACT IN AEPB: 1.0000 | INHALATION_SPRAY | Freq: Every day | RESPIRATORY_TRACT | 11 refills | Status: AC

## 2024-05-02 NOTE — Addendum Note (Signed)
 Addended by: Genaro Bekker on: 05/02/2024 12:12 PM   Modules accepted: Orders

## 2024-05-22 ENCOUNTER — Other Ambulatory Visit: Payer: Self-pay | Admitting: Internal Medicine

## 2024-05-23 NOTE — Telephone Encounter (Signed)
 Requested Prescriptions  Pending Prescriptions Disp Refills   montelukast  (SINGULAIR ) 10 MG tablet [Pharmacy Med Name: Montelukast  Sodium 10 MG Oral Tablet] 100 tablet 2    Sig: TAKE 1 TABLET BY MOUTH AT  BEDTIME     Pulmonology:  Leukotriene Inhibitors Passed - 05/23/2024  3:23 PM      Passed - Valid encounter within last 12 months    Recent Outpatient Visits           1 month ago Type 2 diabetes mellitus with peripheral neuropathy [facility])   [address] [facility] [place], Angeline ORN, NP   3 months ago Acute bacterial sinusitis   [place] [facility] [place], Angeline ORN, [place]

## 2024-05-24 ENCOUNTER — Other Ambulatory Visit: Payer: Self-pay | Admitting: Internal Medicine

## 2024-05-24 ENCOUNTER — Ambulatory Visit: Admitting: Internal Medicine

## 2024-05-24 ENCOUNTER — Ambulatory Visit: Payer: Self-pay

## 2024-05-24 ENCOUNTER — Encounter: Payer: Self-pay | Admitting: Internal Medicine

## 2024-05-24 VITALS — BP 126/84 | Ht 63.0 in | Wt 167.6 lb

## 2024-05-24 DIAGNOSIS — N3000 Acute cystitis without hematuria: Secondary | ICD-10-CM | POA: Diagnosis not present

## 2024-05-24 DIAGNOSIS — F5101 Primary insomnia: Secondary | ICD-10-CM

## 2024-05-24 LAB — POCT URINE DIPSTICK
Bilirubin, UA: NEGATIVE
Glucose, UA: NEGATIVE mg/dL
Ketones, POC UA: NEGATIVE mg/dL
Nitrite, UA: POSITIVE — AB
POC PROTEIN,UA: NEGATIVE
Spec Grav, UA: <=1.005 — AB (ref 1.010–1.025)
Urobilinogen, UA: 0.2 U/dL
pH, UA: 6.5 (ref 5.0–8.0)

## 2024-05-24 MED ORDER — TRAZODONE HCL 50 MG PO TABS: 25.0000 mg | ORAL_TABLET | Freq: Every evening | ORAL | 0 refills | Status: DC | PRN

## 2024-05-24 MED ORDER — NITROFURANTOIN MONOHYD MACRO 100 MG PO CAPS: 100.0000 mg | ORAL_CAPSULE | Freq: Two times a day (BID) | ORAL | 0 refills | Status: AC

## 2024-05-24 NOTE — Progress Notes (Signed)
 Subjective:    Patient ID: Brooke King, female    DOB: December 20, 1940, 83 y.o.   MRN: [id number]  HPI  Discussed the use of AI scribe software for clinical note transcription with the patient, who gave verbal consent to proceed.  Brooke King is an 83 year old female who presents with symptoms suggestive of a urinary tract infection.  She experiences frequent urination with urgency, accompanied by burning sensations. There is also pain in the lower abdomen. No visible hematuria is noted.  She experiences nausea and lower back pain, which she attributes to her known osteoporosis. She uses Voltaren and heat for relief, and states that the back pain is not worse than usual.  She is allergic to penicillins, septra, and keflex.  No fever, chills, vomiting, or worsening of low back pain beyond her usual level.       Review of Systems  Past Medical History:  Diagnosis Date   Allergy    B12 deficiency    Chest pain syndrome    COPD (chronic obstructive pulmonary disease) (HCC)    Depression    GERD (gastroesophageal reflux disease)    Glaucoma    History of colonic polyps    History of degenerative disc disease    c-spine, status post cervical laminectomy   Hyperlipidemia    Hypertension    Hypothyroidism    Insomnia    Migraine    Restless leg syndrome    Seasonal allergies    Sleep apnea    Thyroid  cancer (HCC)    Urinary incontinence     Current Outpatient Medications  Medication Sig Dispense Refill   acetaminophen (TYLENOL) 500 MG tablet Take 500 mg by mouth every 6 (six) hours as needed.     albuterol  (VENTOLIN  HFA) 108 (90 Base) MCG/ACT inhaler USE 1 TO 2 INHALATIONS BY MOUTH  INTO THE LUNGS EVERY 6 HOURS AS  NEEDED FOR WHEEZING OR SHORTNESS OF BREATH 34 g 2   atorvastatin  (LIPITOR) 10 MG tablet TAKE 1 TABLET BY MOUTH DAILY 100 tablet 2   Calcium  Carb-Cholecalciferol (CALCIUM  + D3) 600-200 MG-UNIT TABS Take by mouth.     cetirizine (ZYRTEC) 10 MG tablet Take 10 mg by  mouth daily.     Cranberry 180 MG CAPS Take by mouth.     hydrOXYzine  (ATARAX ) 10 MG tablet Take 1 tablet (10 mg total) by mouth daily as needed. 30 tablet 0   ipratropium (ATROVENT) 0.03 % nasal spray Place into both nostrils.     latanoprost (XALATAN) 0.005 % ophthalmic solution Place 1 drop into both eyes at bedtime.     montelukast  (SINGULAIR ) 10 MG tablet TAKE 1 TABLET BY MOUTH AT  BEDTIME 100 tablet 2   omeprazole  (PRILOSEC) 20 MG capsule TAKE 1 CAPSULE BY MOUTH DAILY 100 capsule 1   POTASSIUM PO Take by mouth daily.     SYNTHROID  112 MCG tablet TAKE 1 TABLET BY MOUTH DAILY  BEFORE BREAKFAST 100 tablet 2   traZODone  (DESYREL ) 50 MG tablet Take 0.5 tablets (25 mg total) by mouth at bedtime as needed for sleep. 45 tablet 0   umeclidinium-vilanterol (ANORO ELLIPTA ) 62.5-25 MCG/ACT AEPB Inhale 1 puff into the lungs daily. 60 each 11   vitamin B-12 (CYANOCOBALAMIN) 1000 MCG tablet Take 1,000 mcg by mouth daily.     No current facility-administered medications for this visit.    Allergies  Allergen Reactions   Toviaz  [Fesoterodine  Fumarate Er] Other (See Comments)    Throat closed  Cortisone    Doxycycline  Hyclate    Hydrocodone    Keflex [Cephalexin]    Septra [Sulfamethoxazole-Trimethoprim]    Tagamet [Cimetidine]    Zoloft [Sertraline]    Penicillins Rash    Family History  Problem Relation Age of Onset   Heart disease Mother    Breast cancer Neg Hx     Social History   Socioeconomic History   Marital status: Widowed    Spouse name: Not on file   Number of children: Not on file   Years of education: Not on file   Highest education level: Not on file  Occupational History   Occupation: Retired  Tobacco Use   Smoking status: Every Day    Current packs/day: 1.00    Average packs/day: 1 pack/day for 103.7 years (103.7 ttl pk-yrs)    Types: Cigarettes    Start date: 1962   Smokeless tobacco: Never  Vaping Use   Vaping status: Never Used  Substance and Sexual  Activity   Alcohol use: No   Drug use: No   Sexual activity: Not on file  Other Topics Concern   Not on file  Social History Narrative   Not on file   Social Drivers of Health   Financial Resource Strain: Low Risk  (04/21/2024)   Overall Financial Resource Strain (CARDIA)    Difficulty of Paying Living Expenses: Not very hard  Food Insecurity: No Food Insecurity (04/21/2024)   Hunger Vital Sign    Worried About Running Out of Food in the Last Year: Never true    Ran Out of Food in the Last Year: Never true  Transportation Needs: No Transportation Needs (04/21/2024)   PRAPARE - Administrator, Civil Service (Medical): No    Lack of Transportation (Non-Medical): No  Physical Activity: Insufficiently Active (04/21/2024)   Exercise Vital Sign    Days of Exercise per Week: 7 days    Minutes of Exercise per Session: 20 min  Stress: No Stress Concern Present (04/21/2024)   [employer] of Occupational Health - Occupational Stress Questionnaire    Feeling of Stress: Only a little  Social Connections: Socially Isolated (04/21/2024)   Social Connection and Isolation Panel    Frequency of Communication with Friends and Family: More than three times a week    Frequency of Social Gatherings with Friends and Family: Once a week    Attends Religious Services: Never    Database administrator or Organizations: No    Attends Banker Meetings: Never    Marital Status: Widowed  Intimate Partner Violence: Not At Risk (04/21/2024)   Humiliation, Afraid, Rape, and Kick questionnaire    Fear of Current or Ex-Partner: No    Emotionally Abused: No    Physically Abused: No    Sexually Abused: No     Constitutional: Denies fever, malaise, fatigue, headache or abrupt weight changes.  Respiratory: Pt reports chronic cough and shortness of breath. Denies difficulty breathing, or sputum production.   Cardiovascular: Denies chest pain, chest tightness, palpitations or swelling in the  hands or feet.  Gastrointestinal: Pt reports nausea. Denies abdominal pain, bloating, constipation, diarrhea or blood in the stool.  GU: Patient reports urgency frequency, burning with urination and bladder pressure.  Denies pain with urination, blood in urine, odor or discharge. Musculoskeletal: Pt reports chronic back and knee pain. Denies decrease in range of motion, difficulty with gait, muscle pain or joint swelling.  Skin: Denies redness, rashes lesions or ulcercations.  Neurological: Patient reports insomnia, neuropathic pain.  Denies dizziness, difficulty with memory, difficulty with speech or problems with balance and coordination.   No other specific complaints in a complete review of systems (except as listed in HPI above).     Objective:   Physical Exam BP 126/84 (BP Location: Left Arm, Patient Position: Sitting, Cuff Size: Normal)   Ht 5' 3 (1.6 m)   Wt 167 lb 9.6 oz (76 kg)   BMI 29.69 kg/m     Wt Readings from Last 3 Encounters:  04/12/24 168 lb (76.2 kg)  02/23/24 172 lb 12.8 oz (78.4 kg)  10/18/23 173 lb 3.2 oz (78.6 kg)    General: Appears her stated age, overweight, in NAD. Cardiovascular: Normal rate. Pulmonary/Chest: Normal effort.. No respiratory distress.  Abdomen: Soft and tender bowel bladder.  No CVA tenderness noted. Neurological: Alert and oriented.   BMET    Component Value Date/Time   NA 142 04/12/2024 0952   K 4.2 04/12/2024 0952   K 3.8 12/15/2013 1202   CL 105 04/12/2024 0952   CO2 27 04/12/2024 0952   GLUCOSE 133 (H) 04/12/2024 0952   BUN 11 04/12/2024 0952   CREATININE 0.87 04/12/2024 0952   CALCIUM  9.7 04/12/2024 0952   GFRNONAA >60 01/19/2012 0949   GFRAA >60 01/19/2012 0949    Lipid Panel     Component Value Date/Time   CHOL 151 04/12/2024 0952   TRIG 130 04/12/2024 0952   HDL 53 04/12/2024 0952   CHOLHDL 2.8 04/12/2024 0952   LDLCALC 77 04/12/2024 0952    CBC    Component Value Date/Time   WBC 7.7 04/12/2024 0952    RBC 4.42 04/12/2024 0952   HGB 13.4 04/12/2024 0952   HCT 42.2 04/12/2024 0952   PLT 133 (L) 04/12/2024 0952   MCV 95.5 04/12/2024 0952   MCH 30.3 04/12/2024 0952   MCHC 31.8 (L) 04/12/2024 0952   RDW 13.1 04/12/2024 0952   LYMPHSABS 2,310 02/10/2022 1333   EOSABS 392 02/10/2022 1333   BASOSABS 91 02/10/2022 1333    Hgb A1C Lab Results  Component Value Date   HGBA1C 6.6 (H) 04/12/2024           Assessment & Plan:   Assessment and Plan    Urinary tract infection Urinalysis confirmed UTI with small blood, moderate leukocytes, and positive nitrates. Considered Nitrofurantoin  due to allergies to penicillins, septra, and deflex. Differential includes pyelonephritis and urosepsis if condition worsens. - Prescribed Nitrofurantoin  100 mg twice daily for 5 days. - Ordered urine culture. - Advised increased water intake and avoidance of caffeine. - Instructed to monitor symptoms and seek hospital care if symptoms rapidly worsen. - Plan to review urine culture results and adjust antibiotics if necessary.        RTC in 4 months for your annual exam Angeline Laura, NP

## 2024-05-24 NOTE — Telephone Encounter (Signed)
Will discuss at upcoming appointment today 

## 2024-05-24 NOTE — Patient Instructions (Signed)

## 2024-05-24 NOTE — Telephone Encounter (Signed)
 FYI Only or Action Required?: FYI only for provider.  Patient was last seen in primary care on 04/12/2024 by Antonette Angeline ORN, NP.  Called Nurse Triage reporting No chief complaint on file..  Symptoms began several days ago.  Interventions attempted: OTC medications: Azo.  Symptoms are: gradually worsening.  Triage Disposition: See Physician Within 24 Hours  Patient/caregiver understands and will follow disposition?: Yes  Copied from CRM #[id number]. Topic: Clinical - Red Word Triage >> May 24, 2024  8:57 AM Emylou G wrote: [facility] that prompted transfer to Nurse Triage: UTI.SABRA itching and burning.. hurts to pee ( pressure ) Reason for Disposition  Bad or foul-smelling urine  Urinating more frequently than usual (i.e., frequency) OR new-onset of the feeling of an urgent need to urinate (i.e., urgency)  Answer Assessment - Initial Assessment Questions 1. SYMPTOM: What's the main symptom you're concerned about? (e.g., frequency, incontinence)     Pressure, Dysuria, Frequency  2. ONSET: When did the  symptoms  start?     Monday  3. PAIN: Is there any pain? If Yes, ask: How bad is it? (Scale: 1-10; mild, moderate, severe)      Mild to Moderate  4. CAUSE: What do you think is causing the symptoms?     UTI  5. OTHER SYMPTOMS: Do you have any other symptoms? (e.g., blood in urine, fever, flank pain, pain with urination)     Burning with urination, Concentrated Odor, Cloudy Urine  6. PREGNANCY: Is there any chance you are pregnant? When was your last menstrual period?     No and No  Protocols used: Urinary Symptoms-A-AH

## 2024-05-24 NOTE — Telephone Encounter (Signed)
 Copied from CRM #[id number]. Topic: Clinical - Medication Refill >> May 24, 2024  8:58 AM Emylou G wrote: Medication: traZODone  (DESYREL ) 50 MG tablet  Has the patient contacted their pharmacy? No (Agent: If no, request that the patient contact the pharmacy for the refill. If patient does not wish to contact the pharmacy document the reason why and proceed with request.) (Agent: If yes, when and what did the pharmacy advise?)  This is the patient's preferred pharmacy:  [facility] - [place], [place] - [address] [place] [address] Phone: [phone] Fax: [phone]  Is this the correct pharmacy for this prescription? Yes If no, delete pharmacy and type the correct one.   Has the prescription been filled recently? No  Is the patient out of the medication? Yes  Has the patient been seen for an appointment in the last year OR does the patient have an upcoming appointment? Yes  Can we respond through MyChart? No Agent: Please be advised that Rx refills may take up to 3 business days. We ask that you follow-up with your pharmacy.

## 2024-05-27 LAB — URINE CULTURE
MICRO NUMBER:: 16918121
SPECIMEN QUALITY:: ADEQUATE

## 2024-05-29 ENCOUNTER — Ambulatory Visit: Payer: Self-pay | Admitting: Internal Medicine

## 2024-05-29 MED ORDER — CIPROFLOXACIN HCL 500 MG PO TABS: 500.0000 mg | ORAL_TABLET | Freq: Two times a day (BID) | ORAL | 0 refills | Status: AC

## 2024-06-02 ENCOUNTER — Encounter: Payer: Self-pay | Admitting: Internal Medicine

## 2024-06-02 ENCOUNTER — Ambulatory Visit (INDEPENDENT_AMBULATORY_CARE_PROVIDER_SITE_OTHER): Admitting: Internal Medicine

## 2024-06-02 ENCOUNTER — Ambulatory Visit: Payer: Self-pay

## 2024-06-02 VITALS — BP 130/72 | Ht 63.0 in | Wt 167.8 lb

## 2024-06-02 DIAGNOSIS — L247 Irritant contact dermatitis due to plants, except food: Secondary | ICD-10-CM | POA: Diagnosis not present

## 2024-06-02 MED ORDER — METHYLPREDNISOLONE ACETATE 80 MG/ML IJ SUSP
80.0000 mg | Freq: Once | INTRAMUSCULAR | Status: AC
  Administered 2024-06-02: 80 mg via INTRAMUSCULAR

## 2024-06-02 MED ORDER — PREDNISONE 20 MG PO TABS: 20.0000 mg | ORAL_TABLET | Freq: Every day | ORAL | 0 refills | Status: DC

## 2024-06-02 NOTE — Telephone Encounter (Signed)
 Double booked with Brooke King this morning.

## 2024-06-02 NOTE — Progress Notes (Signed)
 Subjective:    Patient ID: Brooke King, female    DOB: 09-Oct-1940, 83 y.o.   MRN: [id number]  HPI  Discussed the use of AI scribe software for clinical note transcription with the patient, who gave verbal consent to proceed.  JALEIGH King is an 83 year old female who presents with an itchy rash.  The rash began one to two days ago and is localized to her right forearm and left upper eyelid. It is itchy without any burning or pain. She has experienced a similar episode in the past when she was exposed to poison ivy.  She suspects the rash may be due to contact with poison ivy or poison oak, as she was working in the yard prior to its onset.  For symptom relief, she has applied cortisone 10 cream, which has helped reduce the itching. Additionally, she wrapped her arm last night to prevent the rash from spreading to her bed sheets.  She reports no rash on her back or belly and believes it is only on her right forearm and left upper eyelid.       Review of Systems  Past Medical History:  Diagnosis Date   Allergy    B12 deficiency    Chest pain syndrome    COPD (chronic obstructive pulmonary disease) (HCC)    Depression    GERD (gastroesophageal reflux disease)    Glaucoma    History of colonic polyps    History of degenerative disc disease    c-spine, status post cervical laminectomy   Hyperlipidemia    Hypertension    Hypothyroidism    Insomnia    Migraine    Restless leg syndrome    Seasonal allergies    Sleep apnea    Thyroid  cancer (HCC)    Urinary incontinence     Current Outpatient Medications  Medication Sig Dispense Refill   acetaminophen (TYLENOL) 500 MG tablet Take 500 mg by mouth every 6 (six) hours as needed.     albuterol  (VENTOLIN  HFA) 108 (90 Base) MCG/ACT inhaler USE 1 TO 2 INHALATIONS BY MOUTH  INTO THE LUNGS EVERY 6 HOURS AS  NEEDED FOR WHEEZING OR SHORTNESS OF BREATH 34 g 2   atorvastatin  (LIPITOR) 10 MG tablet TAKE 1 TABLET BY MOUTH DAILY 100 tablet  2   Calcium  Carb-Cholecalciferol (CALCIUM  + D3) 600-200 MG-UNIT TABS Take by mouth.     cetirizine (ZYRTEC) 10 MG tablet Take 10 mg by mouth daily.     ciprofloxacin  (CIPRO ) 500 MG tablet Take 1 tablet (500 mg total) by mouth 2 (two) times daily for 5 days. 10 tablet 0   Cranberry 180 MG CAPS Take by mouth.     hydrOXYzine  (ATARAX ) 10 MG tablet Take 1 tablet (10 mg total) by mouth daily as needed. 30 tablet 0   ipratropium (ATROVENT) 0.03 % nasal spray Place into both nostrils.     latanoprost (XALATAN) 0.005 % ophthalmic solution Place 1 drop into both eyes at bedtime.     montelukast  (SINGULAIR ) 10 MG tablet TAKE 1 TABLET BY MOUTH AT  BEDTIME 100 tablet 2   nitrofurantoin , macrocrystal-monohydrate, (MACROBID ) 100 MG capsule Take 1 capsule (100 mg total) by mouth 2 (two) times daily. 10 capsule 0   omeprazole  (PRILOSEC) 20 MG capsule TAKE 1 CAPSULE BY MOUTH DAILY 100 capsule 1   POTASSIUM PO Take by mouth daily.     SYNTHROID  112 MCG tablet TAKE 1 TABLET BY MOUTH DAILY  BEFORE BREAKFAST 100 tablet 2  traZODone  (DESYREL ) 50 MG tablet Take 0.5 tablets (25 mg total) by mouth at bedtime as needed for sleep. 45 tablet 0   umeclidinium-vilanterol (ANORO ELLIPTA ) 62.5-25 MCG/ACT AEPB Inhale 1 puff into the lungs daily. 60 each 11   vitamin B-12 (CYANOCOBALAMIN) 1000 MCG tablet Take 1,000 mcg by mouth daily.     XYLIMELTS 550 MG DISK      No current facility-administered medications for this visit.    Allergies  Allergen Reactions   Toviaz  [Fesoterodine  Fumarate Er] Other (See Comments)    Throat closed   Cortisone    Doxycycline  Hyclate    Hydrocodone    Keflex [Cephalexin]    Septra [Sulfamethoxazole-Trimethoprim]    Tagamet [Cimetidine]    Zoloft [Sertraline]    Penicillins Rash    Family History  Problem Relation Age of Onset   Heart disease Mother    Breast cancer Neg Hx     Social History   Socioeconomic History   Marital status: Widowed    Spouse name: Not on file    Number of children: Not on file   Years of education: Not on file   Highest education level: Not on file  Occupational History   Occupation: Retired  Tobacco Use   Smoking status: Every Day    Current packs/day: 1.00    Average packs/day: 1 pack/day for 103.7 years (103.7 ttl pk-yrs)    Types: Cigarettes    Start date: 1962   Smokeless tobacco: Never  Vaping Use   Vaping status: Never Used  Substance and Sexual Activity   Alcohol use: No   Drug use: No   Sexual activity: Not on file  Other Topics Concern   Not on file  Social History Narrative   Not on file   Social Drivers of Health   Financial Resource Strain: Low Risk  (04/21/2024)   Overall Financial Resource Strain (CARDIA)    Difficulty of Paying Living Expenses: Not very hard  Food Insecurity: No Food Insecurity (04/21/2024)   Hunger Vital Sign    Worried About Running Out of Food in the Last Year: Never true    Ran Out of Food in the Last Year: Never true  Transportation Needs: No Transportation Needs (04/21/2024)   PRAPARE - Administrator, Civil Service (Medical): No    Lack of Transportation (Non-Medical): No  Physical Activity: Insufficiently Active (04/21/2024)   Exercise Vital Sign    Days of Exercise per Week: 7 days    Minutes of Exercise per Session: 20 min  Stress: No Stress Concern Present (04/21/2024)   [employer] of Occupational Health - Occupational Stress Questionnaire    Feeling of Stress: Only a little  Social Connections: Socially Isolated (04/21/2024)   Social Connection and Isolation Panel    Frequency of Communication with Friends and Family: More than three times a week    Frequency of Social Gatherings with Friends and Family: Once a week    Attends Religious Services: Never    Database administrator or Organizations: No    Attends Banker Meetings: Never    Marital Status: Widowed  Intimate Partner Violence: Not At Risk (04/21/2024)   Humiliation, Afraid, Rape, and  Kick questionnaire    Fear of Current or Ex-Partner: No    Emotionally Abused: No    Physically Abused: No    Sexually Abused: No     Constitutional: Denies fever, malaise, fatigue, headache or abrupt weight changes.  Respiratory: Pt reports chronic  cough and shortness of breath. Denies difficulty breathing, or sputum production.   Cardiovascular: Denies chest pain, chest tightness, palpitations or swelling in the hands or feet.  Musculoskeletal: Pt reports chronic back and knee pain. Denies decrease in range of motion, difficulty with gait, muscle pain or joint swelling.  Skin: Pt reports rash. Denies ulcercations.  Neurological: Patient reports insomnia, neuropathic pain.  Denies dizziness, difficulty with memory, difficulty with speech or problems with balance and coordination.   No other specific complaints in a complete review of systems (except as listed in HPI above).     Objective:   Physical Exam BP 130/72 (BP Location: Left Arm, Patient Position: Sitting, Cuff Size: Normal)   Ht 5' 3 (1.6 m)   Wt 167 lb 12.8 oz (76.1 kg)   BMI 29.72 kg/m      Wt Readings from Last 3 Encounters:  05/24/24 167 lb 9.6 oz (76 kg)  04/12/24 168 lb (76.2 kg)  02/23/24 172 lb 12.8 oz (78.4 kg)    General: Appears her stated age, overweight, in NAD. Skin: Grouped, erythematous vesicles noted of right forearm and left upper eyelid. Cardiovascular: Normal rate. Pulmonary/Chest: Normal effort.. No respiratory distress.  Neurological: Alert and oriented.   BMET    Component Value Date/Time   NA 142 04/12/2024 0952   K 4.2 04/12/2024 0952   K 3.8 12/15/2013 1202   CL 105 04/12/2024 0952   CO2 27 04/12/2024 0952   GLUCOSE 133 (H) 04/12/2024 0952   BUN 11 04/12/2024 0952   CREATININE 0.87 04/12/2024 0952   CALCIUM  9.7 04/12/2024 0952   GFRNONAA >60 01/19/2012 0949   GFRAA >60 01/19/2012 0949    Lipid Panel     Component Value Date/Time   CHOL 151 04/12/2024 0952   TRIG 130  04/12/2024 0952   HDL 53 04/12/2024 0952   CHOLHDL 2.8 04/12/2024 0952   LDLCALC 77 04/12/2024 0952    CBC    Component Value Date/Time   WBC 7.7 04/12/2024 0952   RBC 4.42 04/12/2024 0952   HGB 13.4 04/12/2024 0952   HCT 42.2 04/12/2024 0952   PLT 133 (L) 04/12/2024 0952   MCV 95.5 04/12/2024 0952   MCH 30.3 04/12/2024 0952   MCHC 31.8 (L) 04/12/2024 0952   RDW 13.1 04/12/2024 0952   LYMPHSABS 2,310 02/10/2022 1333   EOSABS 392 02/10/2022 1333   BASOSABS 91 02/10/2022 1333    Hgb A1C Lab Results  Component Value Date   HGBA1C 6.6 (H) 04/12/2024           Assessment & Plan:    Assessment and Plan    Irritant contact dermatitis due to plants Acute dermatitis on right forearm likely from plant exposure. Differential includes poison ivy or poison oak. - Administered 80 mg Depo Medrol  injection. - Prescribed prednisone  20 mg daily for 5 days.      RTC in 4 months for your annual exam Angeline Laura, NP

## 2024-06-02 NOTE — Patient Instructions (Signed)
 Poison Ivy Dermatitis Poison ivy dermatitis is redness and soreness of the skin caused by chemicals in the leaves of the poison ivy plant. You may have very bad itching, swelling, a rash, and blisters. What are the causes? Touching a poison ivy plant. Touching something that has the chemical on it. This may include animals or objects that have come in contact with the plant. What increases the risk? Going outdoors often in wooded or [place] areas. Going outdoors without wearing protective clothing, such as closed shoes, long pants, and a long-sleeved shirt. What are the signs or symptoms?  Skin redness. Very bad itching. A rash that often includes bumps and blisters. The rash usually appears 48 hours after exposure, if you have had it before. If this is the first time you have it, the rash may not appear until a week after exposure. Swelling. This may occur if the reaction is very bad. Symptoms usually last for 1-2 weeks. The first time you get this condition, symptoms may last 3-4 weeks. How is this treated? This condition may be treated with: Hydrocortisone cream or calamine lotion to relieve itching. Oatmeal baths to soothe the skin. Medicines, such as over-the-counter antihistamine tablets. Oral steroid medicine for very bad reactions. Follow these instructions at home: Medicines Take or apply over-the-counter and prescription medicines only as told by your doctor. Use hydrocortisone cream or calamine lotion as needed to help with itching. General instructions Do not scratch or rub your skin. Put a cold, wet cloth (cold compress) on the affected areas or take baths in cool water. This will help with itching. Avoid hot baths and showers. Take oatmeal baths as needed. Use colloidal oatmeal. You can get this at a pharmacy or grocery store. Follow the instructions on the package. While you have the rash, wash your clothes right after you wear them. Check the affected area every day  for signs of infection. Check for: More redness, swelling, or pain. Fluid or blood. Warmth. Pus or a bad smell. Keep all follow-up visits. Your doctor may want to see how your skin is doing with treatment. How is this prevented?  Know what poison ivy looks like, so you can avoid it. This plant has three leaves with flowering branches on a single stem. The leaves are glossy. The leaves have uneven edges that come to a point. If you touch poison ivy, wash your skin with soap and water right away. Be sure to wash under your fingernails. When hiking or camping, wear long pants, a long-sleeved shirt, long socks, and hiking boots. You can also use a lotion on your skin that helps to prevent contact with poison ivy. If you think that your clothes or outdoor gear came in contact with poison ivy, rinse them off with a garden hose before you bring them inside your house. When doing yard work or gardening, wear gloves, long sleeves, long pants, and boots. Wash your garden tools and gloves if they come in contact with poison ivy. If you think that your pet has come into contact with poison ivy, wash them with pet shampoo and water. Make sure to wear gloves while washing your pet. Contact a doctor if: You have open sores in the rash area. You have any signs of infection. You have redness that spreads past the rash area. You have a fever. You have a rash over a large area of your body. You have a rash on your eyes, mouth, or genitals. Your rash does not get better after  a few weeks. Get help right away if: Your face swells or your eyes swell shut. You have trouble breathing. You have trouble swallowing. These symptoms may be an emergency. Do not wait to see if the symptoms will go away. Get help right away. Call 911. This information is not intended to replace advice given to you by your health care provider. Make sure you discuss any questions you have with your health care provider. Document  Revised: 02/05/2022 Document Reviewed: 02/05/2022 Elsevier Patient Education  2024 [employer].

## 2024-06-02 NOTE — Telephone Encounter (Signed)
 FYI Only or Action Required?: Action required by provider: request for appointment.  Patient was last seen in primary care on 05/24/2024 by Brooke King ORN, NP.  Called Nurse Triage reporting [facility].  Symptoms began yesterday.  Interventions attempted: OTC medications: hydrocortisone.  Symptoms are: gradually worsening.  Triage Disposition: See HCP Within 4 Hours (Or PCP Triage)  Patient/caregiver understands and will follow disposition?: No, refuses disposition            Summary: poison oak   Reason for Triage: poison oak on eye lid           Reason for Disposition  [1] Severe poison ivy, oak, or sumac reaction in the past AND [2] face or genitals involved  Answer Assessment - Initial Assessment Questions Additional info: No appointments are available in office today, offered alternate location but patient refused I am too old to be driving around is insisting on pcp practice location only and demanding sdv for shot. Please follow up with Brooke King [phone]   1. APPEARANCE of RASH: What does the rash look like?      Poison oak rash 2. LOCATION: Where is the rash located?  (e.g., face, genitals, hands, legs)     Eye lid and arm 3. SIZE: How large is the rash?      small 4. ONSET: When did the rash begin?      yesterday 5. ITCHING: Does the rash itch? If Yes, ask: How bad is it?     yes 6. EXPOSURE:  How were you exposed to the plant (poison ivy, poison oak, sumac)  When were you exposed?  Note: Sometimes a poison ivy/oak/sumac rash does not appear for 2 to 3 weeks after exposure.       7. PAST HISTORY: Have you had a poison ivy rash before? If Yes, ask: How bad was it?      8. OTHER SYMPTOMS: Do you have any other symptoms? (e.g., fever)      denies 9. PREGNANCY: Is there any chance you are pregnant? When was your last menstrual period?  Protocols used: Poison Ivy - Oak - Sumac-A-AH

## 2024-06-14 ENCOUNTER — Telehealth: Payer: Self-pay

## 2024-06-14 NOTE — Telephone Encounter (Signed)
 Patient advised and verbalized understanding

## 2024-06-14 NOTE — Telephone Encounter (Signed)
 Her potassium levels have always been normal.  I would recommend that she increase her water intake.  She may want to consider magnesium 400 mg at bedtime.  If symptoms persist or worsen, she may want to schedule an appointment for evaluation.

## 2024-06-14 NOTE — Telephone Encounter (Signed)
 Copied from CRM [id number]. Topic: Clinical - Medical Advice >> Jun 14, 2024  9:05 AM Rea ORN wrote: Reason for CRM: pt having leg and finger cramps at night since last week. Pt wonders if it is related to her potasium. She would nurse to call back.  Call Back # [phone]

## 2024-06-19 ENCOUNTER — Ambulatory Visit: Admitting: Nurse Practitioner

## 2024-07-10 ENCOUNTER — Other Ambulatory Visit: Payer: Self-pay | Admitting: Internal Medicine

## 2024-07-11 ENCOUNTER — Ambulatory Visit: Admitting: Nurse Practitioner

## 2024-07-11 ENCOUNTER — Encounter: Payer: Self-pay | Admitting: Nurse Practitioner

## 2024-07-11 VITALS — BP 124/72 | HR 75 | Temp 97.1°F | Ht 63.0 in | Wt 165.0 lb

## 2024-07-11 DIAGNOSIS — G4733 Obstructive sleep apnea (adult) (pediatric): Secondary | ICD-10-CM | POA: Diagnosis not present

## 2024-07-11 DIAGNOSIS — F17210 Nicotine dependence, cigarettes, uncomplicated: Secondary | ICD-10-CM

## 2024-07-11 DIAGNOSIS — J410 Simple chronic bronchitis: Secondary | ICD-10-CM

## 2024-07-11 DIAGNOSIS — F17200 Nicotine dependence, unspecified, uncomplicated: Secondary | ICD-10-CM

## 2024-07-11 NOTE — Assessment & Plan Note (Addendum)
 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 Stable on current regimen. Reviewed options for improvement in chronic symptoms, including step up to triple therapy. She would prefer to refrain from ICS use, given hx of thrush. Felt comfortable remaining on current regimen. Discussed ongoing cough partially related to continued smoking. Smoking cessation strongly advised. Encouraged mucociliary clearance as needed. Trigger prevention reviewed. Action plan in place.  Patient Instructions  Continue Albuterol  inhaler 2 puffs every 6 hours as needed for shortness of breath or wheezing. Notify if symptoms persist despite rescue inhaler/neb use. Continue Anoro 1 puff daily Continue montelukast  1 tab daily  Continue zyrtec 1 tab daily  Try saline nasal gel in each nostril before you put your CPAP on Call your medical supply company to help with the humidity Let the provider who manages your CPAP know if you're still having difficulties   Follow up in 1 year with Dr. Isadora. If symptoms do not improve or worsen, please contact office for sooner follow up or seek emergency care.

## 2024-07-11 NOTE — Assessment & Plan Note (Signed)
 OSA on CPAP. Advised to discuss issues with managing provider. Encouraged to trial saline nasal gel in each nostril at bedtime. May need to consider pressure change or humidity setting change, which we also discussed. She is aware of risks of untreated OSA and necessity of continued therapy. Safe driving practices encouraged

## 2024-07-11 NOTE — Progress Notes (Signed)
 @Patient  ID: Brooke King, female    DOB: 10/18/1940, 83 y.o.   MRN: [id number]  Chief Complaint  Patient presents with   Shortness of Breath    DOE. Some wheezing. Cough with [place] sputum. Using Albuterol  at night. Anoro once a day in the morning, does help with her breathing.     Referring provider: Antonette Angeline LELON, NP  HPI: 83 year old female, active smoker followed for chronic bronchitis. She is a patient of Dr. Clydene and last seen in office 03/2023. Past medical history significant for HTN, OSA on CPAP, GERD, DM, hypothyroid, HLD, insomnia, anxiety.   TEST/EVENTS:   04/06/2023: OV with Dr. Isadora. COPD and chronic bronchitis. Chronic symptoms for many years; yellowish sputum with cough. Mostly in AM. Does have some occasional wheezing and SOB with exertion. Report significant burden from her anxiety and feels that some of her inhalers cause her to be more anxious. Was on Trelegy but developed thrush and stopped using it. PCP prescribed Breztri  but hasn't used this either. Treated in May for increased SOB and cough with prednisone  and abx. Prescribed montelukast , which she takes regularly. Active smoker with 1 ppd. CXR with changes of bronchitis. Wants to minimize further testing. Hold off on PFT at this point. Prior CT chest without any evidence of btx or early ILD. Not using longstanding inhalers. Reinitiate LAMA/LABA therapy with Anoro. Outside window for lung cancer screening program given age. Will hold off on obtaining any chest imaging. Smoking cessation advised.   07/11/2024: Today - follow up Discussed the use of AI scribe software for clinical note transcription with the patient, who gave verbal consent to proceed.  History of Present Illness Brooke King is an 83 year old female with COPD who presents for a routine follow-up.  She uses her Anoro inhaler daily and her albuterol  inhaler once in the evenings before bed. She experiences some wheezing and cough, which she  describes as 'nothing too terrible'. She feels short of breath with activities such as going up long steps. She has not required prednisone  for her breathing recently. When she coughs, she sometimes produces sputum that is yellow or [place], which is chronic for her. No fevers, chills, hemoptysis, weight loss, anorexia.   She continues to take Singulair  (montelukast ) at night and Zyrtec during the day for allergies, which she feels helps.   She reports difficulty sleeping and uses a CPAP machine. Sometimes this causes dryness. She has tried different masks and settings, with her daughter assisting in adjusting the humidity settings. She does wear her CPAP nightly. Feels she benefits from use. Managed by another office for OSA. Plans to discuss difficulties with them.     Allergies  Allergen Reactions   Toviaz  [Fesoterodine  Fumarate Er] Other (See Comments)    Throat closed   Cortisone    Doxycycline  Hyclate    Hydrocodone    Keflex [Cephalexin]    Septra [Sulfamethoxazole-Trimethoprim]    Tagamet [Cimetidine]    Zoloft [Sertraline]    Penicillins Rash    Immunization History  Administered Date(s) Administered   Fluad Quad(high Dose 65+) 06/10/2021   Fluad Trivalent(High Dose 65+) 09/27/2017   Influenza Inj Mdck Quad Pf 06/20/2018, 06/22/2019   Influenza-Unspecified 07/03/2015, 06/11/2016   Pneumococcal Conjugate-13 12/10/2015   Pneumococcal Polysaccharide-23 06/01/2014, 09/04/2021    Past Medical History:  Diagnosis Date   Allergy    B12 deficiency    Chest pain syndrome    COPD (chronic obstructive pulmonary disease) (HCC)  Depression    GERD (gastroesophageal reflux disease)    Glaucoma    History of colonic polyps    History of degenerative disc disease    c-spine, status post cervical laminectomy   Hyperlipidemia    Hypertension    Hypothyroidism    Insomnia    Migraine    Restless leg syndrome    Seasonal allergies    Sleep apnea    Thyroid  cancer (HCC)     Urinary incontinence     Tobacco History: Social History   Tobacco Use  Smoking Status Every Day   Current packs/day: 1.00   Average packs/day: 1 pack/day for 103.8 years (103.8 ttl pk-yrs)   Types: Cigarettes   Start date: 1962  Smokeless Tobacco Never  Tobacco Comments   Smokes 10-15 cigarettes a day- khj 07/11/2024   Ready to quit: Not Answered Counseling given: Not Answered Tobacco comments: Smokes 10-15 cigarettes a day- khj 07/11/2024   Outpatient Medications Prior to Visit  Medication Sig Dispense Refill   acetaminophen (TYLENOL) 500 MG tablet Take 500 mg by mouth every 6 (six) hours as needed.     albuterol  (VENTOLIN  HFA) 108 (90 Base) MCG/ACT inhaler USE 1 TO 2 INHALATIONS BY MOUTH  INTO THE LUNGS EVERY 6 HOURS AS  NEEDED FOR WHEEZING OR SHORTNESS OF BREATH 34 g 2   atorvastatin  (LIPITOR) 10 MG tablet TAKE 1 TABLET BY MOUTH DAILY 100 tablet 2   Calcium  Carb-Cholecalciferol (CALCIUM  + D3) 600-200 MG-UNIT TABS Take by mouth.     cetirizine (ZYRTEC) 10 MG tablet Take 10 mg by mouth daily.     Cranberry 180 MG CAPS Take by mouth.     hydrOXYzine  (ATARAX ) 10 MG tablet Take 1 tablet (10 mg total) by mouth daily as needed. 30 tablet 0   ipratropium (ATROVENT) 0.03 % nasal spray Place into both nostrils.     latanoprost (XALATAN) 0.005 % ophthalmic solution Place 1 drop into both eyes at bedtime.     montelukast  (SINGULAIR ) 10 MG tablet TAKE 1 TABLET BY MOUTH AT  BEDTIME 100 tablet 2   nitrofurantoin , macrocrystal-monohydrate, (MACROBID ) 100 MG capsule Take 1 capsule (100 mg total) by mouth 2 (two) times daily. 10 capsule 0   omeprazole  (PRILOSEC) 20 MG capsule TAKE 1 CAPSULE BY MOUTH DAILY 100 capsule 1   POTASSIUM PO Take by mouth daily.     SYNTHROID  112 MCG tablet TAKE 1 TABLET BY MOUTH DAILY  BEFORE BREAKFAST 100 tablet 2   traZODone  (DESYREL ) 50 MG tablet Take 0.5 tablets (25 mg total) by mouth at bedtime as needed for sleep. 45 tablet 0   umeclidinium-vilanterol (ANORO  ELLIPTA) 62.5-25 MCG/ACT AEPB Inhale 1 puff into the lungs daily. 60 each 11   vitamin B-12 (CYANOCOBALAMIN) 1000 MCG tablet Take 1,000 mcg by mouth daily.     XYLIMELTS 550 MG DISK      predniSONE  (DELTASONE ) 20 MG tablet Take 1 tablet (20 mg total) by mouth daily with breakfast. (Patient not taking: Reported on 07/11/2024) 5 tablet 0   No facility-administered medications prior to visit.     Review of Systems: as above    Physical Exam:  BP 124/72   Pulse 75   Temp (!) 97.1 F (36.2 C)   Ht 5' 3 (1.6 m)   Wt 165 lb (74.8 kg)   SpO2 97%   BMI 29.23 kg/m   GEN: Pleasant, interactive, well-appearing; in no acute distress HEENT:  Normocephalic and atraumatic. PERRLA. Sclera Jipson. Nasal turbinates pink,  moist and patent bilaterally. No rhinorrhea present. Oropharynx pink and moist, without exudate or edema. No lesions, ulcerations, or postnasal drip.  NECK:  Supple w/ fair ROM. No JVD present.  CV: RRR, no m/r/g, no peripheral edema. Pulses intact, +2 bilaterally. No cyanosis, pallor or clubbing. PULMONARY:  Unlabored, regular breathing. Clear bilaterally A&P w/o wheezes/rales/rhonchi. No accessory muscle use.  GI: BS present and normoactive. Soft, non-tender to palpation.  Neuro: A/Ox3. No focal deficits noted.   Skin: Warm, no lesions or rashe Psych: Normal affect and behavior. Judgement and thought content appropriate.     Lab Results:  CBC    Component Value Date/Time   WBC 7.7 04/12/2024 0952   RBC 4.42 04/12/2024 0952   HGB 13.4 04/12/2024 0952   HCT 42.2 04/12/2024 0952   PLT 133 (L) 04/12/2024 0952   MCV 95.5 04/12/2024 0952   MCH 30.3 04/12/2024 0952   MCHC 31.8 (L) 04/12/2024 0952   RDW 13.1 04/12/2024 0952   LYMPHSABS 2,310 02/10/2022 1333   EOSABS 392 02/10/2022 1333   BASOSABS 91 02/10/2022 1333    BMET    Component Value Date/Time   NA 142 04/12/2024 0952   K 4.2 04/12/2024 0952   K 3.8 12/15/2013 1202   CL 105 04/12/2024 0952   CO2 27  04/12/2024 0952   GLUCOSE 133 (H) 04/12/2024 0952   BUN 11 04/12/2024 0952   CREATININE 0.87 04/12/2024 0952   CALCIUM  9.7 04/12/2024 0952   GFRNONAA >60 01/19/2012 0949   GFRAA >60 01/19/2012 0949    BNP No results found for: BNP   Imaging:  No results found.  methylPREDNISolone  acetate (DEPO-MEDROL ) injection 80 mg     Date Action Dose Route User   06/02/2024 1108 Given 80 mg Intramuscular (Right Deltoid) Zelia, Amber D, CMA           No data to display          No results found for: NITRICOXIDE      Assessment & Plan:   Chronic obstructive pulmonary disease (HCC) Suspected COPD with chronic bronchitis. Stable on current regimen. Reviewed options for improvement in chronic symptoms, including step up to triple therapy. She would prefer to refrain from ICS use, given hx of thrush. Felt comfortable remaining on current regimen. Discussed ongoing cough partially related to continued smoking. Smoking cessation strongly advised. Encouraged mucociliary clearance as needed. Trigger prevention reviewed. Action plan in place.  Patient Instructions  Continue Albuterol  inhaler 2 puffs every 6 hours as needed for shortness of breath or wheezing. Notify if symptoms persist despite rescue inhaler/neb use. Continue Anoro 1 puff daily Continue montelukast  1 tab daily  Continue zyrtec 1 tab daily  Try saline nasal gel in each nostril before you put your CPAP on Call your medical supply company to help with the humidity Let the provider who manages your CPAP know if you're still having difficulties   Follow up in 1 year with Dr. Isadora. If symptoms do not improve or worsen, please contact office for sooner follow up or seek emergency care.    OSA (obstructive sleep apnea) OSA on CPAP. Advised to discuss issues with managing provider. Encouraged to trial saline nasal gel in each nostril at bedtime. May need to consider pressure change or humidity setting change, which we  also discussed. She is aware of risks of untreated OSA and necessity of continued therapy. Safe driving practices encouraged  Smoker Active smoker. See above. Not a candidate for lung cancer screening given age  Advised if symptoms do not improve or worsen, to please contact office for sooner follow up or seek emergency care.   I spent 35 minutes of dedicated to the care of this patient on the date of this encounter to include pre-visit review of records, face-to-face time with the patient discussing conditions above, post visit ordering of testing, clinical documentation with the electronic health record, making appropriate referrals as documented, and communicating necessary findings to members of the patients care team.  Brooke LULLA Rouleau, NP 07/11/2024  Pt aware and understands NP's role.

## 2024-07-11 NOTE — Assessment & Plan Note (Signed)
 Active smoker. See above. Not a candidate for lung cancer screening given age

## 2024-07-11 NOTE — Patient Instructions (Addendum)
 Continue Albuterol  inhaler 2 puffs every 6 hours as needed for shortness of breath or wheezing. Notify if symptoms persist despite rescue inhaler/neb use. Continue Anoro 1 puff daily Continue montelukast  1 tab daily  Continue zyrtec 1 tab daily  Try saline nasal gel in each nostril before you put your CPAP on Call your medical supply company to help with the humidity Let the provider who manages your CPAP know if you're still having difficulties   Follow up in 1 year with Dr. Isadora. If symptoms do not improve or worsen, please contact office for sooner follow up or seek emergency care.

## 2024-07-12 NOTE — Telephone Encounter (Signed)
 Requested Prescriptions  Pending Prescriptions Disp Refills   omeprazole  (PRILOSEC) 20 MG capsule [Pharmacy Med Name: Omeprazole  20 MG Oral Capsule Delayed Release] 100 capsule 0    Sig: TAKE 1 CAPSULE BY MOUTH DAILY     Gastroenterology: Proton Pump Inhibitors Passed - 07/12/2024  3:40 PM      Passed - Valid encounter within last 12 months    Recent Outpatient Visits           1 month ago Contact dermatitis and eczema due to plant   [place] [facility] [place], Angeline ORN, NP   1 month ago Acute cystitis without hematuria   [place] [facility] [place], [place] W, NP   3 months ago Type 2 diabetes mellitus with peripheral neuropathy [facility])   [place] [facility] [place], Angeline ORN, NP   4 months ago Acute bacterial sinusitis   [place] [facility] [place], Angeline ORN, [place]

## 2024-08-01 ENCOUNTER — Ambulatory Visit: Payer: Self-pay

## 2024-08-01 NOTE — Telephone Encounter (Signed)
 Spoke with patient, explained to her that we can't call anything in with out seeing her. She stated she will use her nebulizer and see if that helps her. She refused appointments at this time

## 2024-08-01 NOTE — Telephone Encounter (Signed)
 FYI Only or Action Required?: Action required by provider: requesting abx.  Patient was last seen in primary care on 06/02/2024 by Antonette Angeline ORN, NP.  Called Nurse Triage reporting Nasal Congestion.  Symptoms began several days ago.  Interventions attempted: OTC medications: mucinex and Prescription medications: Anoro.  Symptoms are: gradually worsening.  Triage Disposition: See HCP Within 4 Hours (Or PCP Triage)  Patient/caregiver understands and will follow disposition?: No, wishes to speak with PCP        Copied from CRM #[id number]. Topic: Clinical - Red Word Triage >> Aug 01, 2024 11:47 AM Ivette P wrote: [facility] that prompted transfer to Nurse Triage: bp is good  congestion, in the chest. hard to breath. breathing gets messed up. chills. furnace is on. Reason for Disposition  [1] Longstanding difficulty breathing (e.g., CHF, COPD, emphysema) AND [2] WORSE than normal    Pt requesting abx/steroids. Triager advised that AV is typically necessary, but pt reports that she does not drive when she is not feeling well and does not know how to navigate MyChart for virtual without her daughter's assistance.  Triager will forward encounter for Angeline, NP 's office to review and advise. Patient verbalized understanding and is expecting call back from office for next steps. Triager also advised that if pt does not hear back from office, to go to [facility] for further evaluation/tx.  Answer Assessment - Initial Assessment Questions 1. RESPIRATORY STATUS: Describe your breathing? (e.g., wheezing, shortness of breath, unable to speak, severe coughing)      Cough, chest congestion, low grade fever 2. ONSET: When did this breathing problem begin?      A few days 3. PATTERN Does the difficult breathing come and go, or has it been constant since it started?      Comes and goes with coughing 4. SEVERITY: How bad is your breathing? (e.g., mild, moderate, severe)      mild Triager does not  appreciate audible SOB/wheezing during call. Pt is speaking in full sentences.  5. RECURRENT SYMPTOM: Have you had difficulty breathing before? If Yes, ask: When was the last time? and What happened that time?      Yes, does not remember last time. 6. CARDIAC HISTORY: Do you have any history of heart disease? (e.g., heart attack, angina, bypass surgery, angioplasty)      denies 7. LUNG HISTORY: Do you have any history of lung disease?  (e.g., pulmonary embolus, asthma, emphysema)     Hx of COPD - endorses taking umeclidinium-vilanterol (ANORO ELLIPTA ) INH and takes once daily Triager reviewed/reinforced albuterol  usage and SIG.  8. CAUSE: What do you think is causing the breathing problem?      denies 9. OTHER SYMPTOMS: Do you have any other symptoms? (e.g., chest pain, cough, dizziness, fever, runny nose)     H/a, sore throat, productive coughing, sneezing, chills, low grade fever 10. O2 SATURATION MONITOR:  Do you use an oxygen saturation monitor (pulse oximeter) at home? If Yes, ask: What is your reading (oxygen level) today? What is your usual oxygen saturation reading? (e.g., 95%)       97 on RA 11. PREGNANCY: Is there any chance you are pregnant? When was your last menstrual period?       N/a 12. TRAVEL: Have you traveled out of the country in the last month? (e.g., travel history, exposures)       N/a  Protocols used: Breathing Difficulty-A-AH

## 2024-09-07 ENCOUNTER — Other Ambulatory Visit: Payer: Self-pay | Admitting: Internal Medicine

## 2024-09-07 DIAGNOSIS — F5101 Primary insomnia: Secondary | ICD-10-CM

## 2024-09-11 NOTE — Telephone Encounter (Signed)
 Requested Prescriptions  Pending Prescriptions Disp Refills   traZODone  (DESYREL ) 50 MG tablet [Pharmacy Med Name: TRAZODONE  50 MG TABLET] 45 tablet 0    Sig: Take 0.5 tablets (25 mg total) by mouth at bedtime as needed for sleep.     Psychiatry: Antidepressants - Serotonin Modulator Passed - 09/11/2024 12:23 PM      Passed - Valid encounter within last 6 months    Recent Outpatient Visits           3 months ago Contact dermatitis and eczema due to plant   [place] [facility] [place], Angeline ORN, NP   3 months ago Acute cystitis without hematuria   [place] [facility] [place], [place] W, NP   5 months ago Type 2 diabetes mellitus with peripheral neuropathy [facility])   [place] [facility] [place], Angeline ORN, NP   6 months ago Acute bacterial sinusitis   [place] [facility] [place], Angeline ORN, [place]

## 2024-10-09 ENCOUNTER — Ambulatory Visit: Payer: Self-pay

## 2024-10-09 NOTE — Telephone Encounter (Signed)
 FYI Only or Action Required?: FYI only for provider: appointment scheduled on 10/10/24.  Patient was last seen in primary care on 06/02/2024 by Antonette Angeline ORN, NP.  Called Nurse Triage reporting Cough and Nasal Congestion.  Symptoms began 1-2 weeks ago.  Interventions attempted: OTC medications: Mucinex and Prescription medications: inhalers.  Symptoms are: unchanged.  Triage Disposition: See Physician Within 24 Hours (overriding See HCP Within 4 Hours (Or PCP Triage))  Patient/caregiver understands and will follow disposition?: Yes                                  1. ONSET: When did the cough begin?      1-2 weeks ago  3. SPUTUM: Describe the color of your sputum (e.g., none, dry cough; clear, Gall, yellow, green)     Yellow 4. HEMOPTYSIS: Are you coughing up any blood? If Yes, ask: How much? (e.g., flecks, streaks, tablespoons, etc.)     Denies 5. DIFFICULTY BREATHING: Are you having difficulty breathing? If Yes, ask: How bad is it? (e.g., mild, moderate, severe)      Mild SOB upon exertion, patient able to speak in clear and complete sentences while on phone with this RN  6. FEVER: Do you have a fever? If Yes, ask: What is your temperature, how was it measured, and when did it start?     Denies 8. LUNG HISTORY: Do you have any history of lung disease?  (e.g., pulmonary embolus, asthma, emphysema)     COPD, per chart 10. OTHER SYMPTOMS: Do you have any other symptoms? (e.g., runny nose, wheezing, chest pain)     Left side/rib pain, wheezing, chills, difficulty sleeping Denies: chest pain    This RN scheduled patient for first available appointment with PCP, in office tomorrow morning.   Reason for Disposition  [1] MILD difficulty breathing (e.g., minimal/no SOB at rest, SOB with walking, pulse < 100) AND [2] still present when not coughing  Protocols used: Cough - Acute Productive-A-AH  Reason for Triage: Congestion,  coughing, sob, wheezing, green mucous

## 2024-10-10 ENCOUNTER — Ambulatory Visit: Admitting: Internal Medicine

## 2024-10-10 ENCOUNTER — Encounter: Payer: Self-pay | Admitting: Internal Medicine

## 2024-10-10 VITALS — BP 134/70 | HR 78 | Ht 63.0 in | Wt 166.8 lb

## 2024-10-10 DIAGNOSIS — J441 Chronic obstructive pulmonary disease with (acute) exacerbation: Secondary | ICD-10-CM | POA: Diagnosis not present

## 2024-10-10 MED ORDER — PREDNISONE 10 MG PO TABS: ORAL_TABLET | ORAL | 0 refills | Status: AC

## 2024-10-10 MED ORDER — AZITHROMYCIN 250 MG PO TABS: ORAL_TABLET | ORAL | 0 refills | Status: AC

## 2024-10-10 NOTE — Patient Instructions (Signed)
 Chronic Obstructive Pulmonary Disease Exacerbation  Chronic obstructive pulmonary disease (COPD) is a long-term (chronic) lung problem. When you have COPD, it can feel harder to breathe in or out. COPD exacerbation is a flare-up of symptoms when breathing gets worse and more treatment may be needed. Without treatment, flare-ups can be life-threatening. If they happen often, your lungs can become more damaged. What are the causes? Not taking your usual COPD medicines as told by your health care provider. A cold or the flu, which can cause infection in your lungs. Being exposed to things that make your breathing worse, such as: Smoke. Air pollution. Fumes. Dust. Allergies. Weather changes. What are the signs or symptoms? Symptoms do not get better or get worse even if you take your medicines as told by your provider. Symptoms may include: More shortness of breath. You may only be able to speak one or two words at a time. More coughing or mucus from your lungs. More wheezing or chest tightness. Being more tired and having less energy. Confusion. How is this diagnosed? This condition is diagnosed based on: Symptoms that get worse. Your medical history. A physical exam. You may also have tests, including: A chest X-ray. Blood or mucus tests. How is this treated? You may be able to stay home or you may need to go to the hospital. Treatment may include: Taking medicines. These may include: Inhalers. These have medicines in them that you breathe in. These may be more of what you already take or they may be new. Steroids. These reduce inflammation in the airways. These may be inhaled, taken by mouth, or given in an IV. Antibiotics. These treat infection. Using oxygen. Using a device to help you clear mucus. Follow these instructions at home: Medicines Take your medicines only as told by your provider. If you were given antibiotics or steroids, take them as told by your provider. Do  not stop taking them even if you start to feel better. Lifestyle Several times a day, wash your hands with soap and water for at least 20 seconds. If you cannot use soap and water, use hand sanitizer. This may help keep you from getting an infection. Avoid being around crowds or people who are sick. Do not smoke or use any products that contain nicotine or tobacco. If you need help quitting, ask your provider. Return to your normal activities when your provider says that it's safe. Use breathing methods to control your stress and catch your breath. How is this prevented? Follow your COPD action plan. The action plan tells you what to do if you're feeling good and what to do when you start feeling worse. Discuss the plan often with your provider. Make sure you get all the shots, also called vaccines, that your provider recommends. Ask your provider about a flu shot and a pneumonia shot. Use oxygen therapy if told by your provider. If you need home oxygen therapy, ask your provider how often to check your oxygen level with a device called an oximeter. Keep all follow-up visits to review your COPD action plan. Your provider will want to check on your condition often to keep you healthy and out of the hospital. Contact a health care provider if: Your COPD symptoms get worse. You have a fever or chills. You have trouble doing daily activities. You have trouble breathing even when you are resting. Get help right away if: You are short of breath and cannot: Talk in full sentences. Do normal activities. You have chest  pain. You feel confused. These symptoms may be an emergency. Call 911 right away. Do not wait to see if the symptoms will go away. Do not drive yourself to the hospital. This information is not intended to replace advice given to you by your health care provider. Make sure you discuss any questions you have with your health care provider. Document Revised: 06/10/2023 Document  Reviewed: 11/23/2022 Elsevier Patient Education  2024 [employer].

## 2024-10-10 NOTE — Progress Notes (Signed)
 "  Subjective:    Patient ID: Brooke King, female    DOB: Jan 29, 1941, 84 y.o.   MRN: [id number]  HPI  Discussed the use of AI scribe software for clinical note transcription with the patient, who gave verbal consent to proceed.  MILANI LOWENSTEIN is an 84 year old female with COPD who presents with cough and shortness of breath.  She has been experiencing shortness of breath and a persistent, productive cough for the past one to two weeks.  The cough is productive of clear, sometimes yellow mucus.  The cough is severe enough to cause pain in her sides. No fever is present, but she experiences chills and shaking when cold.  She has headaches, which she manages with rest. No nausea, vomiting, or diarrhea. Nasal drainage is present, attributed to her CPAP machine, but there is no nasal congestion or ear pain. Throat irritation is noted due to drainage, but no sore throat is reported.  Her current medications include Atrovent nasal spray, Singulair  (montelukast ) taken nightly, Anoro, and albuterol . She continues to smoke but is attempting to cut back. No recent illness in those around her is reported.       Review of Systems   Past Medical History:  Diagnosis Date   Allergy    B12 deficiency    Chest pain syndrome    COPD (chronic obstructive pulmonary disease) (HCC)    Depression    GERD (gastroesophageal reflux disease)    Glaucoma    History of colonic polyps    History of degenerative disc disease    c-spine, status post cervical laminectomy   Hyperlipidemia    Hypertension    Hypothyroidism    Insomnia    Migraine    Restless leg syndrome    Seasonal allergies    Sleep apnea    Thyroid  cancer (HCC)    Urinary incontinence     Current Outpatient Medications  Medication Sig Dispense Refill   acetaminophen (TYLENOL) 500 MG tablet Take 500 mg by mouth every 6 (six) hours as needed.     albuterol  (VENTOLIN  HFA) 108 (90 Base) MCG/ACT inhaler USE 1 TO 2 INHALATIONS BY MOUTH  INTO  THE LUNGS EVERY 6 HOURS AS  NEEDED FOR WHEEZING OR SHORTNESS OF BREATH 34 g 2   atorvastatin  (LIPITOR) 10 MG tablet TAKE 1 TABLET BY MOUTH DAILY 100 tablet 2   Calcium  Carb-Cholecalciferol (CALCIUM  + D3) 600-200 MG-UNIT TABS Take by mouth.     cetirizine (ZYRTEC) 10 MG tablet Take 10 mg by mouth daily.     Cranberry 180 MG CAPS Take by mouth.     hydrOXYzine  (ATARAX ) 10 MG tablet Take 1 tablet (10 mg total) by mouth daily as needed. 30 tablet 0   ipratropium (ATROVENT) 0.03 % nasal spray Place into both nostrils.     latanoprost (XALATAN) 0.005 % ophthalmic solution Place 1 drop into both eyes at bedtime.     montelukast  (SINGULAIR ) 10 MG tablet TAKE 1 TABLET BY MOUTH AT  BEDTIME 100 tablet 2   nitrofurantoin , macrocrystal-monohydrate, (MACROBID ) 100 MG capsule Take 1 capsule (100 mg total) by mouth 2 (two) times daily. 10 capsule 0   omeprazole  (PRILOSEC) 20 MG capsule TAKE 1 CAPSULE BY MOUTH DAILY 100 capsule 0   POTASSIUM PO Take by mouth daily.     predniSONE  (DELTASONE ) 20 MG tablet Take 1 tablet (20 mg total) by mouth daily with breakfast. (Patient not taking: Reported on 07/11/2024) 5 tablet 0   SYNTHROID  112  MCG tablet TAKE 1 TABLET BY MOUTH DAILY  BEFORE BREAKFAST 100 tablet 2   traZODone  (DESYREL ) 50 MG tablet Take 0.5 tablets (25 mg total) by mouth at bedtime as needed for sleep. 45 tablet 0   umeclidinium-vilanterol (ANORO ELLIPTA ) 62.5-25 MCG/ACT AEPB Inhale 1 puff into the lungs daily. 60 each 11   vitamin B-12 (CYANOCOBALAMIN) 1000 MCG tablet Take 1,000 mcg by mouth daily.     XYLIMELTS 550 MG DISK      No current facility-administered medications for this visit.    Allergies  Allergen Reactions   Toviaz  [Fesoterodine  Fumarate Er] Other (See Comments)    Throat closed   Cortisone    Doxycycline  Hyclate    Hydrocodone    Keflex [Cephalexin]    Septra [Sulfamethoxazole-Trimethoprim]    Tagamet [Cimetidine]    Zoloft [Sertraline]    Penicillins Rash    Family History   Problem Relation Age of Onset   Heart disease Mother    Breast cancer Neg Hx     Social History   Socioeconomic History   Marital status: Widowed    Spouse name: Not on file   Number of children: Not on file   Years of education: Not on file   Highest education level: Not on file  Occupational History   Occupation: Retired  Tobacco Use   Smoking status: Every Day    Current packs/day: 1.00    Average packs/day: 1 pack/day for 104.1 years (104.1 ttl pk-yrs)    Types: Cigarettes    Start date: 1962   Smokeless tobacco: Never   Tobacco comments:    Smokes 10-15 cigarettes a day- khj 07/11/2024  Vaping Use   Vaping status: Never Used  Substance and Sexual Activity   Alcohol use: No   Drug use: No   Sexual activity: Not on file  Other Topics Concern   Not on file  Social History Narrative   Not on file   Social Drivers of Health   Tobacco Use: High Risk (07/11/2024)   Patient History    Smoking Tobacco Use: Every Day    Smokeless Tobacco Use: Never    Passive Exposure: Not on file  Financial Resource Strain: Low Risk (04/21/2024)   Overall Financial Resource Strain (CARDIA)    Difficulty of Paying Living Expenses: Not very hard  Food Insecurity: No Food Insecurity (04/21/2024)   Epic    Worried About Radiation Protection Practitioner of Food in the Last Year: Never true    Ran Out of Food in the Last Year: Never true  Transportation Needs: No Transportation Needs (04/21/2024)   Epic    Lack of Transportation (Medical): No    Lack of Transportation (Non-Medical): No  Physical Activity: Insufficiently Active (04/21/2024)   Exercise Vital Sign    Days of Exercise per Week: 7 days    Minutes of Exercise per Session: 20 min  Stress: No Stress Concern Present (04/21/2024)   [employer] of Occupational Health - Occupational Stress Questionnaire    Feeling of Stress: Only a little  Social Connections: Socially Isolated (04/21/2024)   Social Connection and Isolation Panel    Frequency of  Communication with Friends and Family: More than three times a week    Frequency of Social Gatherings with Friends and Family: Once a week    Attends Religious Services: Never    Database Administrator or Organizations: No    Attends Banker Meetings: Never    Marital Status: Widowed  Intimate Partner Violence:  Not At Risk (04/21/2024)   Epic    Fear of Current or Ex-Partner: No    Emotionally Abused: No    Physically Abused: No    Sexually Abused: No  Depression (PHQ2-9): Low Risk (04/21/2024)   Depression (PHQ2-9)    PHQ-2 Score: 2  Recent Concern: Depression (PHQ2-9) - Medium Risk (04/12/2024)   Depression (PHQ2-9)    PHQ-2 Score: 8  Alcohol Screen: Low Risk (04/21/2024)   Alcohol Screen    Last Alcohol Screening Score (AUDIT): 0  Housing: Unknown (04/21/2024)   Epic    Unable to Pay for Housing in the Last Year: No    Number of Times Moved in the Last Year: Not on file    Homeless in the Last Year: No  Utilities: Not At Risk (04/21/2024)   Epic    Threatened with loss of utilities: No  Health Literacy: Adequate Health Literacy (04/21/2024)   B1300 Health Literacy    Frequency of need for help with medical instructions: Never     Constitutional: Pt reports headache, and chills. Denies fever, malaise, fatigue, or abrupt weight changes.  HEENT: Pt reports runny nose and sore throat. Denies eye pain, eye redness, ear pain, ringing in the ears, wax buildup, nasal congestion, bloody nose, or sore throat. Respiratory: Pt reports cough, shortness of breath. Denies difficulty breathing.   Cardiovascular: Denies chest pain, chest tightness, palpitations or swelling in the hands or feet.  Gastrointestinal: Denies abdominal pain, bloating, constipation, diarrhea or blood in the stool.  Musculoskeletal: Denies decrease in range of motion, difficulty with gait, muscle pain or joint pain and swelling.  Neurological: Pt reports insomnia. Denies dizziness, difficulty with memory,  difficulty with speech or problems with balance and coordination.    No other specific complaints in a complete review of systems (except as listed in HPI above).      Objective:   Physical Exam BP 134/70 (BP Location: Right Arm, Patient Position: Sitting, Cuff Size: Normal)   Pulse 78   Ht 5' 3 (1.6 m)   Wt 166 lb 12.8 oz (75.7 kg)   SpO2 100%   BMI 29.55 kg/m    Wt Readings from Last 3 Encounters:  07/11/24 165 lb (74.8 kg)  06/02/24 167 lb 12.8 oz (76.1 kg)  05/24/24 167 lb 9.6 oz (76 kg)    General: Appears her stated age, obese, in NAD. Skin: Warm, dry and intact.  HEENT: Head: normal shape and size, maxillary sinus tenderness noted; Eyes: sclera Hoak, no icterus, conjunctiva pink, PERRLA and EOMs intact; Nose: mucosa pink and moist, septum midline; Throat/Mouth: Teeth present, mucosa pink and moist,+ PND< no exudate, lesions or ulcerations noted.  Neck:  No adenopathy noted. Cardiovascular: Normal rate and rhythm.  Pulmonary/Chest: Normal effort and coarse breath sounds with bilateral expiratory wheezing. No respiratory distress. No rales or ronchi noted.  Neurological: Alert and oriented.   BMET    Component Value Date/Time   NA 142 04/12/2024 0952   K 4.2 04/12/2024 0952   K 3.8 12/15/2013 1202   CL 105 04/12/2024 0952   CO2 27 04/12/2024 0952   GLUCOSE 133 (H) 04/12/2024 0952   BUN 11 04/12/2024 0952   CREATININE 0.87 04/12/2024 0952   CALCIUM  9.7 04/12/2024 0952   GFRNONAA >60 01/19/2012 0949   GFRAA >60 01/19/2012 0949    Lipid Panel     Component Value Date/Time   CHOL 151 04/12/2024 0952   TRIG 130 04/12/2024 0952   HDL 53 04/12/2024 0952  CHOLHDL 2.8 04/12/2024 0952   LDLCALC 77 04/12/2024 0952    CBC    Component Value Date/Time   WBC 7.7 04/12/2024 0952   RBC 4.42 04/12/2024 0952   HGB 13.4 04/12/2024 0952   HCT 42.2 04/12/2024 0952   PLT 133 (L) 04/12/2024 0952   MCV 95.5 04/12/2024 0952   MCH 30.3 04/12/2024 0952   MCHC 31.8 (L)  04/12/2024 0952   RDW 13.1 04/12/2024 0952   LYMPHSABS 2,310 02/10/2022 1333   EOSABS 392 02/10/2022 1333   BASOSABS 91 02/10/2022 1333    Hgb A1C Lab Results  Component Value Date   HGBA1C 6.6 (H) 04/12/2024            Assessment & Plan:   Assessment and Plan    COPD exacerbation Acute exacerbation with increased cough and dyspnea, likely due to smoking. - Prescribed prednisone  10 mg taper for 6 days. - Prescribed azithromycin  (Z-Pak) with 500 mg on the first day and 250 mg on the next four days. - Checked oxygen levels during ambulation, sats only down to 99% on room air. - Encouraged smoking cessation - She will continue to follow with pulmonology      RTC in 1 week for follow-up of chronic conditions Angeline Laura, NP  "

## 2024-10-10 NOTE — Telephone Encounter (Signed)
 Will discuss at upcoming appointment

## 2024-10-15 ENCOUNTER — Other Ambulatory Visit: Payer: Self-pay | Admitting: Internal Medicine

## 2024-10-17 NOTE — Telephone Encounter (Signed)
 Requested by interface surescripts. Future visit 10/18/24.  Requested Prescriptions  Pending Prescriptions Disp Refills   omeprazole  (PRILOSEC) 20 MG capsule [Pharmacy Med Name: Omeprazole  20 MG Oral Capsule Delayed Release] 100 capsule 2    Sig: TAKE 1 CAPSULE BY MOUTH DAILY     Gastroenterology: Proton Pump Inhibitors Passed - 10/17/2024  7:50 AM      Passed - Valid encounter within last 12 months    Recent Outpatient Visits           1 week ago COPD exacerbation [facility])   [place] [facility] [place], [place], NP   4 months ago Contact dermatitis and eczema due to plant   [facility] [place], Angeline ORN, NP   4 months ago Acute cystitis without hematuria   [place] [facility] [place], [place], NP   6 months ago Type 2 diabetes mellitus with peripheral neuropathy [facility])   [place] [facility] [place], Angeline ORN, NP   7 months ago Acute bacterial sinusitis   [place] [facility] [place], Angeline ORN, [place]

## 2024-10-18 ENCOUNTER — Encounter: Admitting: Internal Medicine

## 2024-11-20 ENCOUNTER — Encounter: Admitting: Internal Medicine

## 2025-05-04 ENCOUNTER — Ambulatory Visit

## 2025-05-09 ENCOUNTER — Ambulatory Visit
